# Patient Record
Sex: Female | Born: 1990 | Race: White | Hispanic: No | Marital: Single | Smoking: Former smoker
Health system: Southern US, Community
[De-identification: ages and names within clinical notes are randomized; demographics above are authoritative.]

## PROBLEM LIST (undated history)

## (undated) DIAGNOSIS — I10 Essential (primary) hypertension: Secondary | ICD-10-CM

## (undated) DIAGNOSIS — Z789 Other specified health status: Secondary | ICD-10-CM

## (undated) DIAGNOSIS — F419 Anxiety disorder, unspecified: Secondary | ICD-10-CM

## (undated) HISTORY — DX: Essential (primary) hypertension: I10

## (undated) HISTORY — DX: Other specified health status: Z78.9

---
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
## 2013-10-18 ENCOUNTER — Emergency Department: Payer: Self-pay | Admitting: Emergency Medicine

## 2013-10-28 ENCOUNTER — Emergency Department: Payer: Self-pay | Admitting: Emergency Medicine

## 2014-11-06 ENCOUNTER — Observation Stay: Payer: Self-pay

## 2014-11-09 ENCOUNTER — Observation Stay: Payer: Self-pay | Admitting: Obstetrics and Gynecology

## 2014-11-09 ENCOUNTER — Inpatient Hospital Stay: Payer: Self-pay | Admitting: Obstetrics and Gynecology

## 2014-12-30 NOTE — H&P (Signed)
L&D Evaluation:  History:  HPI 23yoMWF presents in active labor at [redacted]w[redacted]d EGA G1P0000, normal pnc to date.Is Vegetarian.   Presents with contractions   Patient's Medical History No Chronic Illness   Patient's Surgical History none   Medications Pre Natal Vitamins   Allergies NKDA   Social History none   Family History Non-Contributory   ROS:  ROS All systems were reviewed.  HEENT, CNS, GI, GU, Respiratory, CV, Renal and Musculoskeletal systems were found to be normal.   Exam:  Vital Signs stable  other  variant temp 100-101   Urine Protein not completed   General no apparent distress   Mental Status clear   Chest clear   Heart normal sinus rhythm   Abdomen gravid, non-tender   Estimated Fetal Weight Average for gestational age   Fetal Position vt   Back no CVAT   Edema no edema   Pelvic 8/90/-1   Mebranes Ruptured, AROM   Description green/meconium   FHT 160 with moderate variability, occassional sp;ontaneous decels x 2 resolved with position changes and IVF and O2   FHT Description Tachycardia   Ucx regular   Ucx Pain Scale 0 epidural in place   Skin dry   Impression:  Impression active labor   Plan:  Plan monitor contractions and for cervical change   Electronic Signatures: Gennie Alma N (CNM)  (Signed 20-Mar-16 19:13)  Authored: L&D Evaluation   Last Updated: 20-Mar-16 19:13 by Evonnie Pat (CNM)

## 2015-01-27 ENCOUNTER — Telehealth: Payer: Self-pay | Admitting: Obstetrics and Gynecology

## 2015-01-27 NOTE — Telephone Encounter (Signed)
pls make pt appt with mnb she is having bad social anxiety- thanks

## 2015-01-27 NOTE — Telephone Encounter (Signed)
Tris CALLED AND LEFT MSG ON PHONE. SHE SAID SHE HAD QUESTIONS.

## 2015-01-29 ENCOUNTER — Encounter: Payer: Self-pay | Admitting: *Deleted

## 2015-01-30 ENCOUNTER — Ambulatory Visit (INDEPENDENT_AMBULATORY_CARE_PROVIDER_SITE_OTHER): Payer: Self-pay | Admitting: Obstetrics and Gynecology

## 2015-01-30 ENCOUNTER — Other Ambulatory Visit: Payer: Self-pay | Admitting: Obstetrics and Gynecology

## 2015-01-30 ENCOUNTER — Encounter: Payer: Self-pay | Admitting: Obstetrics and Gynecology

## 2015-01-30 VITALS — BP 132/78 | HR 78 | Ht 65.0 in | Wt 126.0 lb

## 2015-01-30 DIAGNOSIS — F4010 Social phobia, unspecified: Secondary | ICD-10-CM

## 2015-01-30 MED ORDER — ALPRAZOLAM 0.5 MG PO TABS: 0.5000 mg | ORAL_TABLET | Freq: Every evening | ORAL | Status: DC | PRN

## 2015-01-30 MED ORDER — ESCITALOPRAM OXALATE 10 MG PO TABS: 10.0000 mg | ORAL_TABLET | Freq: Every day | ORAL | Status: DC

## 2015-01-30 NOTE — Progress Notes (Signed)
Patient ID: Tina Hughes, female   DOB: 03/06/1991, 24 y.o.   MRN: [id number] Subjective:     Tina Hughes is a 24 y.o. female who presents for new evaluation and treatment of anxiety disorder. She has the following anxiety symptoms: difficulty concentrating, dizziness, palpitations, racing thoughts, shortness of breath and sweating. Onset of symptoms was approximately several years ago. Symptoms have been gradually worsening since that time. She denies current suicidal and homicidal ideation. Family history significant for no psychiatric illness. Risk factors: none. Previous treatment includes none. She complains of the following medication side effects: none. The following portions of the patient's history were reviewed and updated as appropriate: allergies, current medications, past family history, past medical history, past social history, past surgical history and problem list.  Review of Systems A comprehensive review of systems was negative.    Objective:    patient calm and no apparent distress    Assessment:    anxiety disorder. Possible organic contributing causes are: none.   Plan:    Reviewed concept of anxiety as biochemical imbalance of neurotransmitters and rationale for treatment. Instructed patient to contact office or on-call physician promptly should condition worsen or any new symptoms appear and provided on-call telephone numbers. IF THE PATIENT HAS ANY SUICIDAL OR HOMICIDAL IDEATIONS, CALL THE OFFICE, DISCUSS WITH A SUPPORT MEMBER, OR GO TO THE ER IMMEDIATELY. Patient was agreeable with this plan. Follow up: 6 weeks. Spent 20 minutes (>50% of visit) discussing the risks of anxiety disorder, the  pathophysiology, etiology, risks, and principles of treatment.

## 2015-03-03 ENCOUNTER — Telehealth: Payer: Self-pay | Admitting: Obstetrics and Gynecology

## 2015-03-03 DIAGNOSIS — F329 Major depressive disorder, single episode, unspecified: Secondary | ICD-10-CM

## 2015-03-03 DIAGNOSIS — F32A Depression, unspecified: Secondary | ICD-10-CM

## 2015-03-03 NOTE — Telephone Encounter (Signed)
PT CALLED AND WOULD LIKE A REFILL ON HER PROZAC. PHARMACY IS [facility] [address]

## 2015-03-04 MED ORDER — FLUOXETINE HCL 20 MG PO CAPS: 20.0000 mg | ORAL_CAPSULE | Freq: Every day | ORAL | Status: DC

## 2015-03-04 NOTE — Telephone Encounter (Signed)
prozac was sent in to [place]

## 2015-03-13 ENCOUNTER — Ambulatory Visit: Payer: Self-pay | Admitting: Obstetrics and Gynecology

## 2015-03-24 ENCOUNTER — Ambulatory Visit (INDEPENDENT_AMBULATORY_CARE_PROVIDER_SITE_OTHER): Payer: Medicaid Other | Admitting: Obstetrics and Gynecology

## 2015-03-24 ENCOUNTER — Encounter: Payer: Self-pay | Admitting: Obstetrics and Gynecology

## 2015-03-24 VITALS — BP 127/77 | HR 87 | Ht 65.0 in | Wt 121.1 lb

## 2015-03-24 DIAGNOSIS — Z30018 Encounter for initial prescription of other contraceptives: Secondary | ICD-10-CM

## 2015-03-24 DIAGNOSIS — Z30017 Encounter for initial prescription of implantable subdermal contraceptive: Secondary | ICD-10-CM

## 2015-03-24 DIAGNOSIS — F419 Anxiety disorder, unspecified: Secondary | ICD-10-CM

## 2015-03-24 MED ORDER — ESCITALOPRAM OXALATE 10 MG PO TABS: 10.0000 mg | ORAL_TABLET | Freq: Every day | ORAL | Status: DC

## 2015-03-24 NOTE — Progress Notes (Signed)
Subjective:    Patient ID: Tina Hughes, female    DOB: 11-30-1990, 24 y.o.   MRN: [id number]  HPI F/U exam for postpartum anxiety- Contraception management  Review of Systems Denies improvement in anxiety, but states xanax helps some- has taken 3 doses; actually feels like anxiety is slightly worse. Sleeping well.     Objective:   Physical Exam A&O x4 Well groomed thin female in no distress    Assessment & Plan:  Postpartum anxeity not under good control; change medication to Lexapro 10mg - rx sent in. RTC prn if symptoms not improving.  Nexplanon insertion: Tina Hughes is a 24 y.o. year old Caucasian female here for Nexplanon insertion.  No LMP recorded. Patient is not currently having periods (Reason: Lactating)., last sexual intercourse was last week, and her pregnancy test today was negative.  Risks/benefits/side effects of Nexplanon have been discussed and her questions have been answered.  Specifically, a failure rate of 08/998 has been reported, with an increased failure rate if pt takes St. John's Wort and/or antiseizure medicaitons.  Tina Hughes is aware of the common side effect of irregular bleeding, which the incidence of decreases over time.  BP 127/77 mmHg  Pulse 87  Ht 5\' 5"  (1.651 m)  Wt 121 lb 1.6 oz (54.931 kg)  BMI 20.15 kg/m2  No results found for this or any previous visit (from the past 24 hour(s)).   She is right-handed, so her left arm, approximately 4 inches proximal from the elbow, was cleansed with alcohol and anesthetized with 2cc of 2% Lidocaine.  The area was cleansed again with betadine and the Nexplanon was inserted per manufacturer's recommendations without difficulty.  A steri-strip and pressure bandage were applied.  Pt was instructed to keep the area clean and dry, remove pressure bandage in 24 hours, and keep insertion site covered with the steri-strip for 3-5 days.  Back up contraception was recommended for 2 weeks.  She was given a card  indicating date Nexplanon was inserted and date it needs to be removed. Follow-up PRN problems.  Tina Hughes,CNM

## 2015-03-24 NOTE — Patient Instructions (Signed)
Etonogestrel implant What is this medicine? ETONOGESTREL (et oh noe JES trel) is a contraceptive (birth control) device. It is used to prevent pregnancy. It can be used for up to 3 years. This medicine may be used for other purposes; ask your health care provider or pharmacist if you have questions. COMMON BRAND NAME(S): Implanon, Nexplanon What should I tell my health care provider before I take this medicine? They need to know if you have any of these conditions: -abnormal vaginal bleeding -blood vessel disease or blood clots -cancer of the breast, cervix, or liver -depression -diabetes -gallbladder disease -headaches -heart disease or recent heart attack -high blood pressure -high cholesterol -kidney disease -liver disease -renal disease -seizures -tobacco smoker -an unusual or allergic reaction to etonogestrel, other hormones, anesthetics or antiseptics, medicines, foods, dyes, or preservatives -pregnant or trying to get pregnant -breast-feeding How should I use this medicine? This device is inserted just under the skin on the inner side of your upper arm by a health care professional. Talk to your pediatrician regarding the use of this medicine in children. Special care may be needed. Overdosage: If you think you've taken too much of this medicine contact a poison control center or emergency room at once. Overdosage: If you think you have taken too much of this medicine contact a poison control center or emergency room at once. NOTE: This medicine is only for you. Do not share this medicine with others. What if I miss a dose? This does not apply. What may interact with this medicine? Do not take this medicine with any of the following medications: -amprenavir -bosentan -fosamprenavir This medicine may also interact with the following medications: -barbiturate medicines for inducing sleep or treating seizures -certain medicines for fungal infections like ketoconazole and  itraconazole -griseofulvin -medicines to treat seizures like carbamazepine, felbamate, oxcarbazepine, phenytoin, topiramate -modafinil -phenylbutazone -rifampin -some medicines to treat HIV infection like atazanavir, indinavir, lopinavir, nelfinavir, tipranavir, ritonavir -St. John's wort This list may not describe all possible interactions. Give your health care provider a list of all the medicines, herbs, non-prescription drugs, or dietary supplements you use. Also tell them if you smoke, drink alcohol, or use illegal drugs. Some items may interact with your medicine. What should I watch for while using this medicine? This product does not protect you against HIV infection (AIDS) or other sexually transmitted diseases. You should be able to feel the implant by pressing your fingertips over the skin where it was inserted. Tell your doctor if you cannot feel the implant. What side effects may I notice from receiving this medicine? Side effects that you should report to your doctor or health care professional as soon as possible: -allergic reactions like skin rash, itching or hives, swelling of the face, lips, or tongue -breast lumps -changes in vision -confusion, trouble speaking or understanding -dark urine -depressed mood -general ill feeling or flu-like symptoms -light-colored stools -loss of appetite, nausea -right upper belly pain -severe headaches -severe pain, swelling, or tenderness in the abdomen -shortness of breath, chest pain, swelling in a leg -signs of pregnancy -sudden numbness or weakness of the face, arm or leg -trouble walking, dizziness, loss of balance or coordination -unusual vaginal bleeding, discharge -unusually weak or tired -yellowing of the eyes or skin Side effects that usually do not require medical attention (Report these to your doctor or health care professional if they continue or are bothersome.): -acne -breast pain -changes in  weight -cough -fever or chills -headache -irregular menstrual bleeding -itching, burning, and   vaginal discharge -pain or difficulty passing urine -sore throat This list may not describe all possible side effects. Call your doctor for medical advice about side effects. You may report side effects to FDA at 1-800-FDA-1088. Where should I keep my medicine? This drug is given in a hospital or clinic and will not be stored at home. NOTE: This sheet is a summary. It may not cover all possible information. If you have questions about this medicine, talk to your doctor, pharmacist, or health care provider.  2015, Elsevier/Gold Standard. (2012-02-13 15:37:45)  

## 2015-04-29 ENCOUNTER — Encounter: Payer: Medicaid Other | Admitting: Obstetrics and Gynecology

## 2016-02-16 ENCOUNTER — Other Ambulatory Visit: Payer: Self-pay | Admitting: *Deleted

## 2016-02-16 ENCOUNTER — Telehealth: Payer: Self-pay | Admitting: *Deleted

## 2016-02-16 MED ORDER — ESCITALOPRAM OXALATE 10 MG PO TABS: 10.0000 mg | ORAL_TABLET | Freq: Every day | ORAL | Status: DC

## 2016-02-16 NOTE — Telephone Encounter (Signed)
Patient called and states she needs a refill of her Lexapro. Her pharmacy [facility] in [place] ( [address])  Thanks

## 2016-02-16 NOTE — Telephone Encounter (Signed)
Done-ac 

## 2016-04-19 ENCOUNTER — Ambulatory Visit (HOSPITAL_COMMUNITY)
Admission: EM | Admit: 2016-04-19 | Discharge: 2016-04-19 | Disposition: A | Discharge status: Home / Self Care | Payer: Medicaid Other | Attending: Family Medicine | Admitting: Family Medicine

## 2016-04-19 ENCOUNTER — Encounter (HOSPITAL_COMMUNITY): Payer: Self-pay | Admitting: Emergency Medicine

## 2016-04-19 DIAGNOSIS — N390 Urinary tract infection, site not specified: Secondary | ICD-10-CM

## 2016-04-19 HISTORY — DX: Anxiety disorder, unspecified: F41.9

## 2016-04-19 LAB — POCT URINALYSIS DIP (DEVICE)
Bilirubin Urine: NEGATIVE
GLUCOSE, UA: NEGATIVE mg/dL
Ketones, ur: NEGATIVE mg/dL
Nitrite: POSITIVE — AB
PROTEIN: 30 mg/dL — AB
SPECIFIC GRAVITY, URINE: 1.02 (ref 1.005–1.030)
UROBILINOGEN UA: 0.2 mg/dL (ref 0.0–1.0)
pH: 7 (ref 5.0–8.0)

## 2016-04-19 LAB — POCT PREGNANCY, URINE: Preg Test, Ur: NEGATIVE

## 2016-04-19 MED ORDER — PHENAZOPYRIDINE HCL 200 MG PO TABS: 200.0000 mg | ORAL_TABLET | Freq: Three times a day (TID) | ORAL | 0 refills | Status: DC

## 2016-04-19 MED ORDER — CIPROFLOXACIN HCL 500 MG PO TABS: 500.0000 mg | ORAL_TABLET | Freq: Two times a day (BID) | ORAL | 0 refills | Status: DC

## 2016-04-19 NOTE — ED Triage Notes (Signed)
The patient presented to the [facility] with a complaint of abdominal pain that started yesterday and hematuria that started this am.

## 2016-04-19 NOTE — ED Provider Notes (Signed)
CSN: [id number]     Arrival date & time 04/19/16  1035 History   First MD Initiated Contact with Patient 04/19/16 1201     Chief Complaint  Patient presents with  . Abdominal Pain   (Consider location/radiation/quality/duration/timing/severity/associated sxs/prior Treatment) Patient c/o dysuria for 2 days   The history is provided by the patient.  Abdominal Pain  Pain location:  Generalized Pain quality: aching   Pain radiates to:  Does not radiate Pain severity:  Moderate Onset quality:  Sudden Duration:  2 days Timing:  Constant Progression:  Worsening Chronicity:  New Relieved by:  Nothing Worsened by:  Nothing Ineffective treatments:  None tried Associated symptoms: dysuria     Past Medical History:  Diagnosis Date  . Anxiety   . Vegetarian    History reviewed. No pertinent surgical history. Family History  Problem Relation Age of Onset  . Cancer Paternal Grandmother     skin cancer   Social History  Substance Use Topics  . Smoking status: Former Research scientist (life sciences)  . Smokeless tobacco: Never Used  . Alcohol use Yes     Comment: occasional   OB History    Gravida Para Term Preterm AB Living   1 1 1     1    SAB TAB Ectopic Multiple Live Births           1     Review of Systems  Constitutional: Negative.   HENT: Negative.   Respiratory: Negative.   Cardiovascular: Negative.   Gastrointestinal: Positive for abdominal pain.  Endocrine: Negative.   Genitourinary: Positive for dysuria.  Musculoskeletal: Negative.   Allergic/Immunologic: Negative.   Neurological: Negative.   Hematological: Negative.   Psychiatric/Behavioral: Negative.     Allergies  Review of patient's allergies indicates no known allergies.  Home Medications   Prior to Admission medications   Medication Sig Start Date End Date Taking? Authorizing Provider  escitalopram (LEXAPRO) 10 MG tablet Take 1 tablet (10 mg total) by mouth daily. 02/16/16  Yes Melody N Shambley, CNM  ALPRAZolam  (XANAX) 0.5 MG tablet Take 1 tablet (0.5 mg total) by mouth at bedtime as needed for anxiety. 01/30/15   Melody N Shambley, CNM  ciprofloxacin (CIPRO) 500 MG tablet Take 1 tablet (500 mg total) by mouth 2 (two) times daily. 04/19/16   Lysbeth Penner, FNP  phenazopyridine (PYRIDIUM) 200 MG tablet Take 1 tablet (200 mg total) by mouth 3 (three) times daily. 04/19/16   Lysbeth Penner, FNP  Prenatal Vit-Fe Fumarate-FA (PRENATAL MULTIVITAMIN) TABS tablet Take 1 tablet by mouth daily at 12 noon.    Historical Provider, MD   Meds Ordered and Administered this Visit  Medications - No data to display  BP 129/84 (BP Location: Left Arm)   Pulse 68   Temp 98.4 F (36.9 C) (Oral)   Resp 18   SpO2 100%  No data found.   Physical Exam  Constitutional: She appears well-developed and well-nourished.  HENT:  Head: Normocephalic and atraumatic.  Right Ear: External ear normal.  Left Ear: External ear normal.  Mouth/Throat: Oropharynx is clear and moist.  Eyes: Conjunctivae and EOM are normal. Pupils are equal, round, and reactive to light.  Neck: Normal range of motion. Neck supple.  Cardiovascular: Normal rate, regular rhythm and normal heart sounds.   Pulmonary/Chest: Effort normal and breath sounds normal.  Abdominal: Soft. Bowel sounds are normal.  Nursing note and vitals reviewed.   Urgent Care Course   Clinical Course    Procedures (including  critical care time)  Labs Review Labs Reviewed  POCT URINALYSIS DIP (DEVICE) - Abnormal; Notable for the following:       Result Value   Hgb urine dipstick MODERATE (*)    Protein, ur 30 (*)    Nitrite POSITIVE (*)    Leukocytes, UA SMALL (*)    All other components within normal limits  POCT PREGNANCY, URINE    Imaging Review No results found.   Visual Acuity Review  Right Eye Distance:   Left Eye Distance:   Bilateral Distance:    Right Eye Near:   Left Eye Near:    Bilateral Near:         MDM   1. UTI (lower urinary  tract infection)    cipro 500mg  one po bid x 7 days #14 Pyridium 200mg  one po tid x 2 d #6    Lysbeth Penner, FNP 04/19/16 1242

## 2016-09-28 ENCOUNTER — Encounter: Payer: Medicaid Other | Admitting: Obstetrics and Gynecology

## 2016-09-30 ENCOUNTER — Encounter: Payer: Self-pay | Admitting: Obstetrics and Gynecology

## 2016-09-30 ENCOUNTER — Ambulatory Visit (INDEPENDENT_AMBULATORY_CARE_PROVIDER_SITE_OTHER): Payer: Medicaid Other | Admitting: Obstetrics and Gynecology

## 2016-09-30 DIAGNOSIS — Z3046 Encounter for surveillance of implantable subdermal contraceptive: Secondary | ICD-10-CM

## 2016-09-30 NOTE — Progress Notes (Signed)
Tina Hughes is a 26 y.o. year old Caucasian female here for Implanon removal.  Patient given informed consent for removal of her Implanon.  There were no vitals taken for this visit.  Appropriate time out taken. Implanon site identified.  Area prepped in usual sterile fashon. One cc of 2% lidocaine was used to anesthetize the area at the distal end of the implant. A small stab incision was made right beside the implant on the distal portion.  The Implanon rod was grasped using hemostats and removed without difficulty.  There was less than 3 cc blood loss. There were no complications.  Steri-strips were applied over the small incision and a pressure bandage was applied.  The patient tolerated the procedure well.  She was instructed to keep the area clean and dry, remove pressure bandage in 24 hours, and keep insertion site covered with the steri-strip for 3-5 days.    Follow-up PRN problems.  Zackarie Chason N Valorie Mcgrory,CNM

## 2016-10-19 ENCOUNTER — Ambulatory Visit
Admission: EM | Admit: 2016-10-19 | Discharge: 2016-10-19 | Disposition: A | Discharge status: Home / Self Care | Payer: Medicaid Other | Attending: Family Medicine | Admitting: Family Medicine

## 2016-10-19 DIAGNOSIS — R300 Dysuria: Secondary | ICD-10-CM

## 2016-10-19 LAB — URINALYSIS, COMPLETE (UACMP) WITH MICROSCOPIC
Glucose, UA: NEGATIVE mg/dL
KETONES UR: NEGATIVE mg/dL
Nitrite: NEGATIVE
PROTEIN: 100 mg/dL — AB
SQUAMOUS EPITHELIAL / LPF: NONE SEEN
Specific Gravity, Urine: 1.02 (ref 1.005–1.030)
pH: 8.5 — ABNORMAL HIGH (ref 5.0–8.0)

## 2016-10-19 LAB — PREGNANCY, URINE: PREG TEST UR: NEGATIVE

## 2016-10-19 MED ORDER — CEPHALEXIN 500 MG PO CAPS: 500.0000 mg | ORAL_CAPSULE | Freq: Two times a day (BID) | ORAL | 0 refills | Status: AC

## 2016-10-19 MED ORDER — PHENAZOPYRIDINE HCL 100 MG PO TABS: 200.0000 mg | ORAL_TABLET | Freq: Three times a day (TID) | ORAL | 0 refills | Status: DC | PRN

## 2016-10-19 MED ORDER — FLUCONAZOLE 150 MG PO TABS: 150.0000 mg | ORAL_TABLET | Freq: Every day | ORAL | 0 refills | Status: DC

## 2016-10-19 NOTE — Discharge Instructions (Signed)
Take medication as prescribed. Rest. Drink plenty of fluids.  ° °Follow up with your primary care physician this week as needed. Return to Urgent care for new or worsening concerns.  ° °

## 2016-10-19 NOTE — ED Provider Notes (Signed)
CSN: [id number]     Arrival date & time 10/19/16  0813 History   First MD Initiated Contact with Patient 10/19/16 [phone]     Chief Complaint  Patient presents with  . Urinary Tract Infection   (Consider location/radiation/quality/duration/timing/severity/associated sxs/prior Treatment) Presenting for the complaints of dysuria 3 days. Patient reports some urinary frequency and urgency but reports burning pain with urination. Reports has had urinary tract infection the past with similar presentation. Reports last UTI was approximately 6 months ago. Denies known trigger, except reports possible bubble bath trigger recently. Reports is sexually active, reports uses barrier protection. Declines pregnancy. Denies abdominal pain, back pain, vaginal complaints, vaginal discharge, vaginal odor, vaginal pain. Reports continues to eat and drink well. Denies fevers.      Past Medical History:  Diagnosis Date  . Anxiety   . Vegetarian    History reviewed. No pertinent surgical history. Family History  Problem Relation Age of Onset  . Cancer Paternal Grandmother     skin cancer   Social History  Substance Use Topics  . Smoking status: Former Research scientist (life sciences)  . Smokeless tobacco: Never Used  . Alcohol use Yes     Comment: occasional   OB History    Gravida Para Term Preterm AB Living   1 1 1     1    SAB TAB Ectopic Multiple Live Births           1     Review of Systems  Constitutional: Negative for activity change, chills, fatigue and fever.  HENT: Negative for sore throat.   Respiratory: Negative for shortness of breath.   Cardiovascular: Negative for chest pain.  Gastrointestinal: Negative for abdominal pain, diarrhea, nausea and vomiting.  Genitourinary: Positive for dysuria, frequency and urgency. Negative for flank pain, hematuria, pelvic pain, vaginal bleeding and vaginal discharge.  Musculoskeletal: Negative for back pain.  Skin: Negative for rash.  Neurological: Negative for dizziness  and weakness.    Allergies  Patient has no known allergies.  Home Medications   Prior to Admission medications   Medication Sig Start Date End Date Taking? Authorizing Provider  cephALEXin (KEFLEX) 500 MG capsule Take 1 capsule (500 mg total) by mouth 2 (two) times daily. 10/19/16 10/26/16  Marylene Land, NP  fluconazole (DIFLUCAN) 150 MG tablet Take 1 tablet (150 mg total) by mouth daily. Take one pill orally, then Repeat in 72 hrs as needed. 10/19/16   Marylene Land, NP  phenazopyridine (PYRIDIUM) 100 MG tablet Take 2 tablets (200 mg total) by mouth 3 (three) times daily as needed for pain. 10/19/16   Marylene Land, NP   Meds Ordered and Administered this Visit  Medications - No data to display  BP 110/68 (BP Location: Left Arm)   Pulse 88   Temp 98.3 F (36.8 C) (Oral)   Resp 18   Wt 130 lb (59 kg)   LMP 09/28/2016   SpO2 100%   BMI 21.63 kg/m  No data found.   Physical Exam  Constitutional: She is oriented to person, place, and time. She appears well-nourished.  HENT:  Head: Atraumatic.  Eyes: Conjunctivae are normal.  Cardiovascular: Normal rate, regular rhythm and normal heart sounds.   Pulmonary/Chest: Effort normal and breath sounds normal. She has no wheezes.  Abdominal: Soft. There is no tenderness. There is no guarding.  No CVA tenderness bilaterally.   Neurological: She is alert and oriented to person, place, and time.  Skin: Skin is warm and dry.    Urgent Care  Course     Procedures (including critical care time)  Labs Review Labs Reviewed  URINALYSIS, COMPLETE (UACMP) WITH MICROSCOPIC - Abnormal; Notable for the following:       Result Value   APPearance CLOUDY (*)    pH 8.5 (*)    Hgb urine dipstick LARGE (*)    Bilirubin Urine SMALL (*)    Protein, ur 100 (*)    Leukocytes, UA LARGE (*)    Bacteria, UA   (*)    Value: TEST NOT REPORTED DUE TO COLOR INTERFERENCE OF URINE PIGMENT   All other components within normal limits  URINE CULTURE   PREGNANCY, URINE    Imaging Review No results found.   Visual Acuity Review  Right Eye Distance:   Left Eye Distance:   Bilateral Distance:    Right Eye Near:   Left Eye Near:    Bilateral Near:         MDM   1. Dysuria    Well-appearing patient. No acute distress. Urinalysis reviewed. Suspect urinary tract infection. Budding yeast also noted and urinalysis and will treat with oral Diflucan. Will treat patient with oral cephalexin and will culture urine. Discussed culturing urine with patient, patient agrees. Encouraged supportive care, rest, fluids. Discussed indication, risks and benefits of medications with patient.  Discussed follow up with Primary care physician this week. Discussed follow up and return parameters including no resolution or any worsening concerns. Patient verbalized understanding and agreed to plan.      Marylene Land, NP 10/19/16 [phone]

## 2016-10-19 NOTE — ED Triage Notes (Signed)
Pt c/o painful urination. She used AZO for a few days with no relief.

## 2016-10-21 LAB — URINE CULTURE: Culture: 70000 — AB

## 2017-02-24 ENCOUNTER — Ambulatory Visit (INDEPENDENT_AMBULATORY_CARE_PROVIDER_SITE_OTHER): Payer: Self-pay | Admitting: Obstetrics and Gynecology

## 2017-02-24 ENCOUNTER — Encounter: Payer: Self-pay | Admitting: Obstetrics and Gynecology

## 2017-02-24 VITALS — BP 138/83 | HR 83 | Ht 66.0 in | Wt 138.0 lb

## 2017-02-24 DIAGNOSIS — Z3401 Encounter for supervision of normal first pregnancy, first trimester: Secondary | ICD-10-CM

## 2017-02-24 LAB — POCT URINE PREGNANCY: Preg Test, Ur: POSITIVE — AB

## 2017-02-24 NOTE — Progress Notes (Signed)
Subjective:     Patient ID: [name], female   DOB: 12-23-1990, 26 y.o.   MRN: [id number]  HPI Here for pregnancy confirmation. Reports onset of nausea 2 weeks ago right after missed menses. UPT at home positive on June 18th. Does reports 2 menses in May, first one in the first week of May lasting the normal 5-6 days, then second one on May 25 & 26 with light spotting.    LMP 01/13/17 gives EDC 10/20/17 and EGA 6w  Review of Systems Negative except nausea    Objective:   Physical Exam  A&Ox4 Well groomed female in no distress UPT+ Blood pressure 138/83, pulse 83, height 5\' 6"  (1.676 m), weight 138 lb (62.6 kg), last menstrual period 01/13/2017. Body mass index is 22.27 kg/m.  Pelvic not done     Assessment:     Missed menses Nausea     Plan:     diclegis samples given and instructed on use. Will return next week for viability and dating scan with NOB labs. Then at 11-12 weeks for NOB PE with me.   Pearce [place], CNM

## 2017-02-28 ENCOUNTER — Ambulatory Visit (INDEPENDENT_AMBULATORY_CARE_PROVIDER_SITE_OTHER): Payer: No Typology Code available for payment source

## 2017-02-28 DIAGNOSIS — Z3401 Encounter for supervision of normal first pregnancy, first trimester: Secondary | ICD-10-CM

## 2017-03-06 ENCOUNTER — Ambulatory Visit: Payer: No Typology Code available for payment source | Admitting: Obstetrics and Gynecology

## 2017-03-06 DIAGNOSIS — N926 Irregular menstruation, unspecified: Secondary | ICD-10-CM

## 2017-03-06 NOTE — Progress Notes (Signed)
Tina Hughes presents for NOB nurse interview visit. Pregnancy confirmation done here at [facility].  G-2 .  P-1 . Pregnancy education material explained and given. _No__ cats in the home. NOB labs ordered. HIV labs and Drug screen were explained optional and she did not decline. Drug screen ordered. PNV encouraged. Genetic screening options discussed. Genetic testing/Unsure.  Pt may discuss with provider. Pt already has appointment with [facility]

## 2017-03-08 LAB — CBC WITH DIFFERENTIAL/PLATELET
BASOS ABS: 0 10*3/uL (ref 0.0–0.2)
Basos: 0 %
EOS (ABSOLUTE): 0 10*3/uL (ref 0.0–0.4)
Eos: 1 %
HEMOGLOBIN: 12.4 g/dL (ref 11.1–15.9)
Hematocrit: 36.4 % (ref 34.0–46.6)
Immature Grans (Abs): 0 10*3/uL (ref 0.0–0.1)
Immature Granulocytes: 0 %
LYMPHS: 32 %
Lymphocytes Absolute: 2.4 10*3/uL (ref 0.7–3.1)
MCH: 27.6 pg (ref 26.6–33.0)
MCHC: 34.1 g/dL (ref 31.5–35.7)
MCV: 81 fL (ref 79–97)
MONOCYTES: 7 %
Monocytes Absolute: 0.6 10*3/uL (ref 0.1–0.9)
Neutrophils Absolute: 4.5 10*3/uL (ref 1.4–7.0)
Neutrophils: 60 %
Platelets: 232 10*3/uL (ref 150–379)
RBC: 4.5 x10E6/uL (ref 3.77–5.28)
RDW: 14.8 % (ref 12.3–15.4)
WBC: 7.6 10*3/uL (ref 3.4–10.8)

## 2017-03-08 LAB — ABO AND RH: Rh Factor: NEGATIVE

## 2017-03-08 LAB — HEPATITIS B SURFACE ANTIGEN: HEP B S AG: NEGATIVE

## 2017-03-08 LAB — ANTIBODY SCREEN: ANTIBODY SCREEN: NEGATIVE

## 2017-03-08 LAB — RUBELLA SCREEN: RUBELLA: 4.28 {index} (ref >0.99)

## 2017-03-08 LAB — VARICELLA ZOSTER ANTIBODY, IGG: VARICELLA: 1759 {index} (ref >165)

## 2017-03-08 LAB — HIV ANTIBODY (ROUTINE TESTING W REFLEX): HIV SCREEN 4TH GENERATION: NONREACTIVE

## 2017-03-08 LAB — RPR: RPR Ser Ql: NONREACTIVE

## 2017-03-09 LAB — URINALYSIS, ROUTINE W REFLEX MICROSCOPIC
Bilirubin, UA: NEGATIVE
KETONES UA: NEGATIVE
LEUKOCYTES UA: NEGATIVE
NITRITE UA: NEGATIVE
RBC, UA: NEGATIVE
Urobilinogen, Ur: 0.2 mg/dL (ref 0.2–1.0)
pH, UA: 5 (ref 5.0–7.5)

## 2017-03-09 LAB — MONITOR DRUG PROFILE 14(MW)
AMPHETAMINE SCREEN URINE: NEGATIVE ng/mL
BARBITURATE SCREEN URINE: NEGATIVE ng/mL
BENZODIAZEPINE SCREEN, URINE: NEGATIVE ng/mL
Buprenorphine, Urine: NEGATIVE ng/mL
CANNABINOIDS UR QL SCN: NEGATIVE ng/mL
COCAINE(METAB.)SCREEN, URINE: NEGATIVE ng/mL
CREATININE(CRT), U: 151.2 mg/dL (ref 20.0–300.0)
FENTANYL, URINE: NEGATIVE pg/mL
MEPERIDINE SCREEN, URINE: NEGATIVE ng/mL
Methadone Screen, Urine: NEGATIVE ng/mL
OPIATE SCREEN URINE: NEGATIVE ng/mL
OXYCODONE+OXYMORPHONE UR QL SCN: NEGATIVE ng/mL
Ph of Urine: 5.5 (ref 4.5–8.9)
Phencyclidine Qn, Ur: NEGATIVE ng/mL
Propoxyphene Scrn, Ur: NEGATIVE ng/mL
SPECIFIC GRAVITY: 1.02
TRAMADOL SCREEN, URINE: NEGATIVE ng/mL

## 2017-03-09 LAB — GC/CHLAMYDIA PROBE AMP
Chlamydia trachomatis, NAA: NEGATIVE
Neisseria gonorrhoeae by PCR: NEGATIVE

## 2017-03-10 LAB — URINE CULTURE

## 2017-03-14 ENCOUNTER — Other Ambulatory Visit: Payer: Self-pay | Admitting: Obstetrics and Gynecology

## 2017-03-14 DIAGNOSIS — R8271 Bacteriuria: Secondary | ICD-10-CM | POA: Insufficient documentation

## 2017-03-14 MED ORDER — FLUCONAZOLE 100 MG PO TABS: 100.0000 mg | ORAL_TABLET | Freq: Every day | ORAL | 0 refills | Status: AC

## 2017-03-31 ENCOUNTER — Ambulatory Visit (INDEPENDENT_AMBULATORY_CARE_PROVIDER_SITE_OTHER): Payer: No Typology Code available for payment source | Admitting: Obstetrics and Gynecology

## 2017-03-31 ENCOUNTER — Encounter: Payer: Self-pay | Admitting: Obstetrics and Gynecology

## 2017-03-31 VITALS — BP 120/72 | HR 88 | Wt 138.0 lb

## 2017-03-31 DIAGNOSIS — F329 Major depressive disorder, single episode, unspecified: Secondary | ICD-10-CM | POA: Insufficient documentation

## 2017-03-31 DIAGNOSIS — Z6791 Unspecified blood type, Rh negative: Secondary | ICD-10-CM

## 2017-03-31 DIAGNOSIS — Z3401 Encounter for supervision of normal first pregnancy, first trimester: Secondary | ICD-10-CM

## 2017-03-31 DIAGNOSIS — Z789 Other specified health status: Secondary | ICD-10-CM | POA: Insufficient documentation

## 2017-03-31 DIAGNOSIS — F419 Anxiety disorder, unspecified: Secondary | ICD-10-CM

## 2017-03-31 DIAGNOSIS — O09899 Supervision of other high risk pregnancies, unspecified trimester: Secondary | ICD-10-CM

## 2017-03-31 DIAGNOSIS — O26899 Other specified pregnancy related conditions, unspecified trimester: Secondary | ICD-10-CM

## 2017-03-31 LAB — POCT URINALYSIS DIPSTICK
Bilirubin, UA: NEGATIVE
Blood, UA: NEGATIVE
Glucose, UA: NEGATIVE
KETONES UA: NEGATIVE
LEUKOCYTES UA: NEGATIVE
Nitrite, UA: NEGATIVE
PH UA: 6 (ref 5.0–8.0)
PROTEIN UA: NEGATIVE
SPEC GRAV UA: 1.01 (ref 1.010–1.025)
Urobilinogen, UA: 0.2 E.U./dL

## 2017-03-31 MED ORDER — PRENATAL 19 29-1 MG PO CHEW: 1.0000 | CHEWABLE_TABLET | Freq: Every day | ORAL | 11 refills | Status: DC

## 2017-03-31 NOTE — Patient Instructions (Signed)

## 2017-03-31 NOTE — Progress Notes (Signed)
NEW OB HISTORY AND PHYSICAL  SUBJECTIVE:       Tina Hughes is a 26 y.o. [id number] female, Patient's last menstrual period was 01/13/2017., Estimated Date of Delivery: 10/20/17, [redacted]w[redacted]d, presents today for establishment of Prenatal Care. She has no unusual complaints and complains of occasional vomiting after brushing teeth.      Gynecologic History Patient's last menstrual period was 01/13/2017. Normal Contraception: none Last Pap: 2016. Results were: normal  Obstetric History OB History  Gravida Para Term Preterm AB Living  2 1 1     1   SAB TAB Ectopic Multiple Live Births          1    # Outcome Date GA Lbr Len/2nd Weight Sex Delivery Anes PTL Lv  2 Current           1 Term 11/09/14    Jerilynn Mages Vag-Spont   LIV      Past Medical History:  Diagnosis Date  . Anxiety   . Vegetarian     History reviewed. No pertinent surgical history.  Current Outpatient Prescriptions on File Prior to Visit  Medication Sig Dispense Refill  . Prenatal Vit-Fe Fumarate-FA (PRENATAL MULTIVITAMIN) TABS tablet Take 1 tablet by mouth daily at 12 noon.     No current facility-administered medications on file prior to visit.     No Known Allergies  Social History   Social History  . Marital status: Married    Spouse name: N/A  . Number of children: N/A  . Years of education: N/A   Occupational History  . Not on file.   Social History Main Topics  . Smoking status: Former Research scientist (life sciences)  . Smokeless tobacco: Never Used  . Alcohol use Yes     Comment: occasional  . Drug use: No  . Sexual activity: Yes    Birth control/ protection: None   Other Topics Concern  . Not on file   Social History Narrative  . No narrative on file    Family History  Problem Relation Age of Onset  . Cancer Paternal Grandmother        skin cancer    The following portions of the patient's history were reviewed and updated as appropriate: allergies, current medications, past OB history, past medical history, past  surgical history, past family history, past social history, and problem list.    OBJECTIVE: Initial Physical Exam (New OB)  GENERAL APPEARANCE: alert, well appearing, in no apparent distress, oriented to person, place and time HEAD: normocephalic, atraumatic MOUTH: mucous membranes moist, pharynx normal without lesions and dental hygiene good THYROID: no thyromegaly or masses present BREASTS: not examined LUNGS: clear to auscultation, no wheezes, rales or rhonchi, symmetric air entry HEART: regular rate and rhythm, no murmurs ABDOMEN: soft, nontender, nondistended, no abnormal masses, no epigastric pain, fundus not palpable and FHT present EXTREMITIES: no redness or tenderness in the calves or thighs SKIN: normal coloration and turgor, no rashes LYMPH NODES: no adenopathy palpable NEUROLOGIC: alert, oriented, normal speech, no focal findings or movement disorder noted  PELVIC EXAM not indicated  ASSESSMENT: Normal pregnancy  PLAN: Prenatal care Desires genetic screening- CFP and MaternIT obtained today See orders

## 2017-03-31 NOTE — Progress Notes (Signed)
NOB physical- pt is doing well, has had some nausea brushing her teeth

## 2017-04-02 LAB — URINE CULTURE

## 2017-04-06 LAB — MATERNIT 21 PLUS CORE, BLOOD
CHROMOSOME 13: NEGATIVE
CHROMOSOME 18: NEGATIVE
Chromosome 21: NEGATIVE
Y Chromosome: NOT DETECTED

## 2017-04-07 LAB — CYSTIC FIBROSIS MUTATION 97: Interpretation: NOT DETECTED

## 2017-04-14 ENCOUNTER — Telehealth: Payer: Self-pay | Admitting: Obstetrics and Gynecology

## 2017-04-14 ENCOUNTER — Telehealth: Payer: Self-pay | Admitting: Certified Nurse Midwife

## 2017-04-14 NOTE — Telephone Encounter (Signed)
The patient called and stated that she has tested positive for bacteria in the urine and that she may have a UTI. The patient would like to be called and prescribed a medication to help with her symptoms. Please advise.

## 2017-04-14 NOTE — Telephone Encounter (Signed)
The patient called and lvm stating that she would like to speak with the nurse Amy. The patient did not disclose any other information. Please advise.

## 2017-04-17 NOTE — Telephone Encounter (Signed)
Please contact patient. Has GBS in urine, but not enough to cause infection at this time. Keep watch for symptoms. Wil; treat for GBS at time of birth. Thanks, JML

## 2017-04-17 NOTE — Telephone Encounter (Signed)
Spoke with pt

## 2017-04-18 NOTE — Telephone Encounter (Signed)
Pt aware.

## 2017-04-28 ENCOUNTER — Ambulatory Visit (INDEPENDENT_AMBULATORY_CARE_PROVIDER_SITE_OTHER): Payer: No Typology Code available for payment source | Admitting: Certified Nurse Midwife

## 2017-04-28 ENCOUNTER — Encounter: Payer: Self-pay | Admitting: Certified Nurse Midwife

## 2017-04-28 VITALS — BP 124/69 | HR 102 | Wt 139.0 lb

## 2017-04-28 DIAGNOSIS — Z3689 Encounter for other specified antenatal screening: Secondary | ICD-10-CM

## 2017-04-28 DIAGNOSIS — Z3402 Encounter for supervision of normal first pregnancy, second trimester: Secondary | ICD-10-CM

## 2017-04-28 DIAGNOSIS — R51 Headache: Secondary | ICD-10-CM

## 2017-04-28 DIAGNOSIS — R519 Headache, unspecified: Secondary | ICD-10-CM

## 2017-04-28 DIAGNOSIS — O26892 Other specified pregnancy related conditions, second trimester: Secondary | ICD-10-CM

## 2017-04-28 LAB — POCT URINALYSIS DIPSTICK
Bilirubin, UA: NEGATIVE
Glucose, UA: NEGATIVE
Ketones, UA: NEGATIVE
Leukocytes, UA: NEGATIVE
NITRITE UA: NEGATIVE
PH UA: 5 (ref 5.0–8.0)
Protein, UA: NEGATIVE
RBC UA: NEGATIVE
SPEC GRAV UA: 1.025 (ref 1.010–1.025)
UROBILINOGEN UA: 0.2 U/dL

## 2017-04-28 MED ORDER — MAGNESIUM OXIDE 400 (241.3 MG) MG PO TABS: 400.0000 mg | ORAL_TABLET | Freq: Two times a day (BID) | ORAL | 5 refills | Status: DC

## 2017-04-28 NOTE — Patient Instructions (Addendum)

## 2017-04-29 NOTE — Progress Notes (Signed)
ROB-Reports fatigue and daily headaches relieved by Tylenol. Notes a decrease in vomiting. Discussed home treatment measures including use of OTC medications. Rx Magnesium oxide, see orders. Reviewed red flag symptoms and when to call. RTC x 5 weeks for anatomy scan and ROB.

## 2017-06-02 ENCOUNTER — Ambulatory Visit (INDEPENDENT_AMBULATORY_CARE_PROVIDER_SITE_OTHER): Payer: No Typology Code available for payment source | Admitting: Certified Nurse Midwife

## 2017-06-02 ENCOUNTER — Encounter: Payer: Self-pay | Admitting: Certified Nurse Midwife

## 2017-06-02 ENCOUNTER — Ambulatory Visit (INDEPENDENT_AMBULATORY_CARE_PROVIDER_SITE_OTHER): Payer: No Typology Code available for payment source

## 2017-06-02 ENCOUNTER — Encounter: Payer: No Typology Code available for payment source | Admitting: Certified Nurse Midwife

## 2017-06-02 ENCOUNTER — Other Ambulatory Visit: Payer: No Typology Code available for payment source

## 2017-06-02 VITALS — BP 122/67 | HR 89 | Wt 144.7 lb

## 2017-06-02 DIAGNOSIS — Z23 Encounter for immunization: Secondary | ICD-10-CM

## 2017-06-02 DIAGNOSIS — Z3689 Encounter for other specified antenatal screening: Secondary | ICD-10-CM

## 2017-06-02 DIAGNOSIS — Z3402 Encounter for supervision of normal first pregnancy, second trimester: Secondary | ICD-10-CM

## 2017-06-02 LAB — POCT URINALYSIS DIPSTICK
BILIRUBIN UA: NEGATIVE
GLUCOSE UA: 500
KETONES UA: NEGATIVE
Leukocytes, UA: NEGATIVE
Nitrite, UA: NEGATIVE
PH UA: 6 (ref 5.0–8.0)
Protein, UA: NEGATIVE
Spec Grav, UA: 1.025 (ref 1.010–1.025)
Urobilinogen, UA: 0.2 E.U./dL

## 2017-06-02 NOTE — Progress Notes (Signed)
ROB-  Pt states she has been doing ok but she still has occ. Headaches that has been better since taking magnesium. Urine dip glucose 500

## 2017-06-02 NOTE — Progress Notes (Signed)
ROB, doing well.  Feels movement. Has occasional cramps. Anatomy u/s today, not complete but otherwise normal. See below for full report. Follow 2 wks for u/s and 4 wks for ROB .   Philip Aspen, CNM  ULTRASOUND REPORT  Location: [facility] Date of Service: 06/02/17  Indications:Anatomy Findings:  Tina Hughes intrauterine pregnancy is visualized with FHR at 145 BPM. Biometrics give an (U/S) Gestational age of  14 2/7 weeks and an (U/S) EDD of 10/18/17; this correlates with the clinically established EDD of 10/20/17.  Fetal presentation is breech.  EFW: 347 grams (0lb 12oz). Placenta: Anterior and grade 1. Placenta is 6.4cm from cervical os. AFI: Appears WNL subjectively.  Anatomic survey is incomplete.  All heart views, an open hand, and profile are needed to complete survey. Gender - female  .    Right Ovary was not visualized. Left Ovary measures 3.0 x 1.5 x 2.1 cm., and appears WNL. There is no evidence of a corpus luteal cyst. Survey of the adnexa demonstrates no adnexal masses. There is no free peritoneal fluid in the cul de sac.  Impression: 1. 20 week Viable Singleton Intrauterine pregnancy by U/S. 2. (U/S) EDD is consistent with Clinically established (LMP) EDD of 10/20/17. 3. Incomplete anatomical survey.  Heart views, open hand, and profile are still needed.  Recommendations: 1.Clinical correlation with the patient's History and Physical Exam. 2. F/U U/S recommended in 2 weeks to obtain remaining anatomy.  Dario Ave, RDMS  Scan reviewed and agree with findings.  Melody Shambley,CNM

## 2017-06-02 NOTE — Patient Instructions (Signed)

## 2017-06-08 ENCOUNTER — Other Ambulatory Visit: Payer: Self-pay | Admitting: Certified Nurse Midwife

## 2017-06-08 DIAGNOSIS — Z0489 Encounter for examination and observation for other specified reasons: Secondary | ICD-10-CM

## 2017-06-08 DIAGNOSIS — IMO0002 Reserved for concepts with insufficient information to code with codable children: Secondary | ICD-10-CM

## 2017-06-15 ENCOUNTER — Telehealth: Payer: Self-pay | Admitting: Obstetrics and Gynecology

## 2017-06-15 NOTE — Telephone Encounter (Signed)
Left pt detailed message.

## 2017-06-15 NOTE — Telephone Encounter (Signed)
Patient called and stated that she would like for Amy to give her a call back. No other information was disclosed on the vm. Please advise.

## 2017-06-15 NOTE — Telephone Encounter (Signed)
Patient called and stated that she is 22 wks and took a shower this morning and noticed that she had some brown discharge, and slight cramping. The patient would like to speak with a nurse to make sure everything is okay. No other information was disclosed. Please advise.

## 2017-06-16 ENCOUNTER — Ambulatory Visit (INDEPENDENT_AMBULATORY_CARE_PROVIDER_SITE_OTHER): Payer: No Typology Code available for payment source

## 2017-06-16 DIAGNOSIS — IMO0002 Reserved for concepts with insufficient information to code with codable children: Secondary | ICD-10-CM

## 2017-06-16 DIAGNOSIS — Z0489 Encounter for examination and observation for other specified reasons: Secondary | ICD-10-CM

## 2017-06-30 ENCOUNTER — Encounter: Payer: No Typology Code available for payment source | Admitting: Obstetrics and Gynecology

## 2017-07-05 ENCOUNTER — Other Ambulatory Visit: Payer: Self-pay

## 2017-07-05 ENCOUNTER — Ambulatory Visit (INDEPENDENT_AMBULATORY_CARE_PROVIDER_SITE_OTHER): Payer: No Typology Code available for payment source | Admitting: Certified Nurse Midwife

## 2017-07-05 ENCOUNTER — Encounter: Payer: Self-pay | Admitting: Certified Nurse Midwife

## 2017-07-05 VITALS — BP 129/71 | HR 77 | Wt 153.4 lb

## 2017-07-05 DIAGNOSIS — Z3402 Encounter for supervision of normal first pregnancy, second trimester: Secondary | ICD-10-CM

## 2017-07-05 DIAGNOSIS — Z130 Encounter for screening for diseases of the blood and blood-forming organs and certain disorders involving the immune mechanism: Secondary | ICD-10-CM

## 2017-07-05 DIAGNOSIS — Z131 Encounter for screening for diabetes mellitus: Secondary | ICD-10-CM

## 2017-07-05 LAB — POCT URINALYSIS DIPSTICK
BILIRUBIN UA: NEGATIVE
GLUCOSE UA: NEGATIVE
KETONES UA: NEGATIVE
Leukocytes, UA: NEGATIVE
Nitrite, UA: NEGATIVE
PH UA: 6 (ref 5.0–8.0)
Protein, UA: NEGATIVE
RBC UA: NEGATIVE
Spec Grav, UA: 1.01 (ref 1.010–1.025)
Urobilinogen, UA: 0.2 E.U./dL

## 2017-07-05 NOTE — Patient Instructions (Signed)

## 2017-07-05 NOTE — Progress Notes (Signed)
ROB doing well. No complaints. Endorses good fetal movement. Reviewed 1 hr GTT at next appointment . She verbalizes understanding Follow up 4 wks.   Philip Aspen, CNM

## 2017-07-26 ENCOUNTER — Other Ambulatory Visit: Payer: Self-pay

## 2017-07-26 ENCOUNTER — Encounter: Payer: Self-pay | Admitting: Emergency Medicine

## 2017-07-26 ENCOUNTER — Ambulatory Visit
Admission: EM | Admit: 2017-07-26 | Discharge: 2017-07-26 | Disposition: A | Discharge status: Home / Self Care | Payer: No Typology Code available for payment source | Attending: Family Medicine | Admitting: Family Medicine

## 2017-07-26 DIAGNOSIS — Z808 Family history of malignant neoplasm of other organs or systems: Secondary | ICD-10-CM | POA: Diagnosis not present

## 2017-07-26 DIAGNOSIS — B9789 Other viral agents as the cause of diseases classified elsewhere: Secondary | ICD-10-CM

## 2017-07-26 DIAGNOSIS — Z87891 Personal history of nicotine dependence: Secondary | ICD-10-CM | POA: Diagnosis not present

## 2017-07-26 DIAGNOSIS — J029 Acute pharyngitis, unspecified: Secondary | ICD-10-CM | POA: Diagnosis present

## 2017-07-26 DIAGNOSIS — J069 Acute upper respiratory infection, unspecified: Secondary | ICD-10-CM

## 2017-07-26 LAB — RAPID STREP SCREEN (MED CTR MEBANE ONLY): STREPTOCOCCUS, GROUP A SCREEN (DIRECT): NEGATIVE

## 2017-07-26 NOTE — ED Provider Notes (Signed)
[facility]    CSN: [id number] Arrival date & time: 07/26/17  1433     History   Chief Complaint Chief Complaint  Patient presents with  . Sore Throat    HPI Tina Hughes is a 26 y.o. female.   The history is provided by the patient.  Sore Throat  Pertinent negatives include no headaches.  URI  Presenting symptoms: rhinorrhea and sore throat   Severity:  Moderate Onset quality:  Sudden Duration:  1 day Timing:  Constant Progression:  Unchanged Chronicity:  New Relieved by:  None tried Ineffective treatments:  None tried Associated symptoms: no headaches, no sinus pain and no wheezing   Risk factors: sick contacts   Risk factors: not elderly, no chronic cardiac disease, no chronic kidney disease, no chronic respiratory disease, no diabetes mellitus, no immunosuppression, no recent illness and no recent travel     Past Medical History:  Diagnosis Date  . Anxiety   . Vegetarian     Patient Active Problem List   Diagnosis Date Noted  . Rh negative state in antepartum period 03/31/2017  . Anxiety 03/31/2017  . Vegetarian diet 03/31/2017  . GBS bacteriuria 03/14/2017    History reviewed. No pertinent surgical history.  OB History    Gravida Para Term Preterm AB Living   2 1 1     1    SAB TAB Ectopic Multiple Live Births           1       Home Medications    Prior to Admission medications   Medication Sig Start Date End Date Taking? Authorizing Provider  magnesium oxide (MAG-OX) 400 (241.3 Mg) MG tablet Take 1 tablet (400 mg total) by mouth 2 (two) times daily. 04/28/17   Lawhorn, Lara Mulch, CNM  UNABLE TO FIND Med Name: [facility]'s bounty Prenatal multi healthy mom    [provider]    Family History Family History  Problem Relation Age of Onset  . Cancer Paternal Grandmother        skin cancer    Social History Social History   Tobacco Use  . Smoking status: Former Research scientist (life sciences)  . Smokeless tobacco: Never Used    Substance Use Topics  . Alcohol use: No    Comment: occasional  . Drug use: No     Allergies   Patient has no known allergies.   Review of Systems Review of Systems  HENT: Positive for rhinorrhea and sore throat. Negative for sinus pain.   Respiratory: Negative for wheezing.   Neurological: Negative for headaches.     Physical Exam Triage Vital Signs ED Triage Vitals  Enc Vitals Group     BP 07/26/17 1459 121/69     Pulse Rate 07/26/17 1459 90     Resp 07/26/17 1459 14     Temp 07/26/17 1459 98.6 F (37 C)     Temp Source 07/26/17 1459 Oral     SpO2 07/26/17 1459 98 %     Weight 07/26/17 1456 164 lb (74.4 kg)     Height --      Head Circumference --      Peak Flow --      Pain Score 07/26/17 1456 6     Pain Loc --      Pain Edu? --      Excl. in [place]? --    No data found.  Updated Vital Signs BP 121/69 (BP Location: Right Arm)   Pulse 90  Temp 98.6 F (37 C) (Oral)   Resp 14   Wt 164 lb (74.4 kg)   LMP 01/13/2017   SpO2 98%   BMI 26.47 kg/m   Visual Acuity Right Eye Distance:   Left Eye Distance:   Bilateral Distance:    Right Eye Near:   Left Eye Near:    Bilateral Near:     Physical Exam  Constitutional: She appears well-developed and well-nourished. No distress.  HENT:  Head: Normocephalic and atraumatic.  Right Ear: Tympanic membrane, external ear and ear canal normal.  Left Ear: Tympanic membrane, external ear and ear canal normal.  Nose: No mucosal edema, rhinorrhea, nose lacerations, sinus tenderness, nasal deformity, septal deviation or nasal septal hematoma. No epistaxis.  No foreign bodies. Right sinus exhibits no maxillary sinus tenderness and no frontal sinus tenderness. Left sinus exhibits no maxillary sinus tenderness and no frontal sinus tenderness.  Mouth/Throat: Uvula is midline and mucous membranes are normal. Posterior oropharyngeal erythema present. No oropharyngeal exudate, posterior oropharyngeal edema or tonsillar abscesses.  No tonsillar exudate.  Neck: Normal range of motion. Neck supple. No thyromegaly present.  Cardiovascular: Normal rate, regular rhythm and normal heart sounds.  Pulmonary/Chest: Effort normal and breath sounds normal. No respiratory distress. She has no wheezes. She has no rales.  Lymphadenopathy:    She has no cervical adenopathy.  Skin: She is not diaphoretic.  Nursing note and vitals reviewed.    UC Treatments / Results  Labs (all labs ordered are listed, but only abnormal results are displayed) Labs Reviewed  RAPID STREP SCREEN (NOT AT [facility])  CULTURE, GROUP A STREP [facility])    EKG  EKG Interpretation None       Radiology No results found.  Procedures Procedures (including critical care time)  Medications Ordered in UC Medications - No data to display   Initial Impression / Assessment and Plan / UC Course  I have reviewed the triage vital signs and the nursing notes.  Pertinent labs & imaging results that were available during my care of the patient were reviewed by me and considered in my medical decision making (see chart for details).    Final Clinical Impressions(s) / UC Diagnoses   Final diagnoses:  Viral URI with cough    ED Discharge Orders    None     1. Lab result and diagnosis reviewed with patient 2.Recommend supportive treatment with rest, fluids, salt water gargles, otc meds 3. Follow-up prn if symptoms worsen or don't improve   Controlled Substance Prescriptions [place] Controlled Substance Registry consulted? Not Applicable   Norval Gable, MD 07/26/17 [phone]

## 2017-07-26 NOTE — ED Triage Notes (Signed)
Patient c/o sore throat started yesterday.

## 2017-07-26 NOTE — ED Notes (Signed)
Fetal Heart 153

## 2017-07-29 LAB — CULTURE, GROUP A STREP (THRC)

## 2017-08-01 ENCOUNTER — Ambulatory Visit (INDEPENDENT_AMBULATORY_CARE_PROVIDER_SITE_OTHER): Payer: No Typology Code available for payment source | Admitting: Certified Nurse Midwife

## 2017-08-01 ENCOUNTER — Other Ambulatory Visit: Payer: No Typology Code available for payment source

## 2017-08-01 ENCOUNTER — Encounter: Payer: No Typology Code available for payment source | Admitting: Obstetrics and Gynecology

## 2017-08-01 ENCOUNTER — Encounter: Payer: Self-pay | Admitting: Certified Nurse Midwife

## 2017-08-01 VITALS — BP 118/76 | HR 88 | Wt 158.4 lb

## 2017-08-01 DIAGNOSIS — Z23 Encounter for immunization: Secondary | ICD-10-CM | POA: Diagnosis not present

## 2017-08-01 DIAGNOSIS — Z3482 Encounter for supervision of other normal pregnancy, second trimester: Secondary | ICD-10-CM

## 2017-08-01 LAB — POCT URINALYSIS DIPSTICK
BILIRUBIN UA: NEGATIVE
GLUCOSE UA: NEGATIVE
Ketones, UA: NEGATIVE
Leukocytes, UA: NEGATIVE
Nitrite, UA: NEGATIVE
Protein, UA: NEGATIVE
RBC UA: NEGATIVE
Spec Grav, UA: 1.01 (ref 1.010–1.025)
Urobilinogen, UA: 0.2 E.U./dL
pH, UA: 7.5 (ref 5.0–8.0)

## 2017-08-01 MED ORDER — RHO D IMMUNE GLOBULIN 1500 UNITS IM SOSY
1500.0000 [IU] | PREFILLED_SYRINGE | Freq: Once | INTRAMUSCULAR | Status: AC
  Administered 2017-08-01: 1500 [IU] via INTRAMUSCULAR

## 2017-08-01 NOTE — Progress Notes (Signed)
ROB, doing well. Discussed cord blood donation (private & public) and birth control after baby. Reviewed GTT and cbc to be done tomorrow am. Rhogam today and BTC/Tdap  form completed. Rob in 2 wks.   Philip Aspen, CNM

## 2017-08-01 NOTE — Patient Instructions (Signed)
Glucose Tolerance Test During Pregnancy The glucose tolerance test (GTT) is a blood test used to determine if you have developed a type of diabetes during pregnancy (gestational diabetes). This is when your body does not properly process sugar (glucose) in the food you eat, resulting in high blood glucose levels. Typically, a GTT is done after you have had a 1-hour glucose test with results that indicate you possibly have gestational diabetes. It may also be done if:  You have a history of giving birth to very large babies or have experienced repeated fetal loss (stillbirth).  You have signs and symptoms of diabetes, such as: ? Changes in your vision. ? Tingling or numbness in your hands or feet. ? Changes in hunger, thirst, and urination not otherwise explained by your pregnancy.  The GTT lasts about 3 hours. You will be given a sugar-water solution to drink at the beginning of the test. You will have blood drawn before you drink the solution and then again 1, 2, and 3 hours after you drink it. You will not be allowed to eat or drink anything else during the test. You must remain at the testing location to make sure that your blood is drawn on time. You should also avoid exercising during the test, because exercise can alter test results. How do I prepare for this test? Eat normally for 3 days prior to the GTT test, including having plenty of carbohydrate-rich foods. Do not eat or drink anything except water during the final 12 hours before the test. In addition, your health care provider may ask you to stop taking certain medicines before the test. What do the results mean? It is your responsibility to obtain your test results. Ask the lab or department performing the test when and how you will get your results. Contact your health care provider to discuss any questions you have about your results. Range of Normal Values Ranges for normal values may vary among different labs and hospitals. You  should always check with your health care provider after having lab work or other tests done to discuss whether your values are considered within normal limits. Normal levels of blood glucose are as follows:  Fasting: less than 105 mg/dL.  1 hour after drinking the solution: less than 190 mg/dL.  2 hours after drinking the solution: less than 165 mg/dL.  3 hours after drinking the solution: less than 145 mg/dL.  Some substances can interfere with GTT results. These may include:  Blood pressure and heart failure medicines, including beta blockers, furosemide, and thiazides.  Anti-inflammatory medicines, including aspirin.  Nicotine.  Some psychiatric medicines.  Meaning of Results Outside Normal Value Ranges GTT test results that are above normal values may indicate a number of health problems, such as:  Gestational diabetes.  Acute stress response.  Cushing syndrome.  Tumors such as pheochromocytoma or glucagonoma.  Long-term kidney problems.  Pancreatitis.  Hyperthyroidism.  Current infection.  Discuss your test results with your health care provider. He or she will use the results to make a diagnosis and determine a treatment plan that is right for you. This information is not intended to replace advice given to you by your health care provider. Make sure you discuss any questions you have with your health care provider. Document Released: 02/07/2012 Document Revised: 01/14/2016 Document Reviewed: 12/13/2013 Elsevier Interactive Patient Education  2017 [employer].

## 2017-08-01 NOTE — Progress Notes (Signed)
ROB- tdap,rhogam, and BTC- today. 1 HR GTT in the am.

## 2017-08-02 ENCOUNTER — Other Ambulatory Visit: Payer: No Typology Code available for payment source

## 2017-08-02 ENCOUNTER — Other Ambulatory Visit: Payer: Self-pay

## 2017-08-02 DIAGNOSIS — Z3493 Encounter for supervision of normal pregnancy, unspecified, third trimester: Secondary | ICD-10-CM

## 2017-08-02 DIAGNOSIS — Z131 Encounter for screening for diabetes mellitus: Secondary | ICD-10-CM

## 2017-08-02 DIAGNOSIS — Z130 Encounter for screening for diseases of the blood and blood-forming organs and certain disorders involving the immune mechanism: Secondary | ICD-10-CM

## 2017-08-03 ENCOUNTER — Encounter: Payer: Self-pay | Admitting: Certified Nurse Midwife

## 2017-08-03 LAB — CBC
Hematocrit: 31.7 % — ABNORMAL LOW (ref 34.0–46.6)
Hemoglobin: 11.2 g/dL (ref 11.1–15.9)
MCH: 29.8 pg (ref 26.6–33.0)
MCHC: 35.3 g/dL (ref 31.5–35.7)
MCV: 84 fL (ref 79–97)
PLATELETS: 222 10*3/uL (ref 150–379)
RBC: 3.76 x10E6/uL — ABNORMAL LOW (ref 3.77–5.28)
RDW: 13.5 % (ref 12.3–15.4)
WBC: 9.8 10*3/uL (ref 3.4–10.8)

## 2017-08-03 LAB — RPR: RPR: NONREACTIVE

## 2017-08-03 LAB — GLUCOSE, 1 HOUR GESTATIONAL: GESTATIONAL DIABETES SCREEN: 89 mg/dL (ref 65–139)

## 2017-08-17 ENCOUNTER — Encounter: Payer: Self-pay | Admitting: Certified Nurse Midwife

## 2017-08-17 ENCOUNTER — Ambulatory Visit (INDEPENDENT_AMBULATORY_CARE_PROVIDER_SITE_OTHER): Payer: No Typology Code available for payment source | Admitting: Certified Nurse Midwife

## 2017-08-17 VITALS — BP 104/67 | HR 94 | Wt 161.7 lb

## 2017-08-17 DIAGNOSIS — Z3403 Encounter for supervision of normal first pregnancy, third trimester: Secondary | ICD-10-CM

## 2017-08-17 LAB — POCT URINALYSIS DIPSTICK
BILIRUBIN UA: NEGATIVE
Blood, UA: NEGATIVE
Glucose, UA: NEGATIVE
KETONES UA: NEGATIVE
Leukocytes, UA: NEGATIVE
Nitrite, UA: NEGATIVE
PH UA: 7 (ref 5.0–8.0)
Spec Grav, UA: 1.02 (ref 1.010–1.025)
Urobilinogen, UA: 0.2 E.U./dL

## 2017-08-17 NOTE — Progress Notes (Signed)
ROB- Pt states she is doing well, no complaints

## 2017-08-17 NOTE — Patient Instructions (Signed)

## 2017-08-21 NOTE — Progress Notes (Signed)
ROB-Doing well, no questions or concerns. Anticipatory guidance regarding course of prenatal care. Reviewed red flag symptoms and when to call. RTC x 2 weeks for ROB with Deneise Lever or sooner if needed.

## 2017-08-22 NOTE — L&D Delivery Note (Signed)
0830 In room to see patient, reports pelvic pressure with the urge to push. SVE: 10/100/+2, vertex. Effective maternal pushing efforts noted.   0840 Spontaneous vaginal birth of liveborn female infant in right occiput posterior position over intact perineum. Infant immediately to maternal abdomen. Delayed cord clamping, three (3) vessel cord and cord blood collected.APGARS: 8, 9. Weight: 3560 grams (7 pounds 13.6 ounces).   Pitocin infusing. Spontaneous delivery of intact placenta at 0845. Bilateral symmetric labial lacerations hemostatic, left side repaired with 3-0 Monocryl sh to promote bilateral healing. Anesthesia: epidural. In and out cath: 250 ml. QBL: 150 ml. Uterus firm. Lochia: small. Vault check completed. Counts correct x 2.   Initiate routine postpartum care and orders. Mom to postpartum.  Baby to Couplet care / Skin to Skin.  FOB present and bedside and assisted CNM hand in hand with birth of Violet. Additional family members present at bedside.   Tina Hughes, CNM [facility], [facility] 10/14/2017, 9:16 AM

## 2017-08-25 ENCOUNTER — Ambulatory Visit
Admission: EM | Admit: 2017-08-25 | Discharge: 2017-08-25 | Disposition: A | Discharge status: Home / Self Care | Payer: No Typology Code available for payment source | Attending: Family Medicine | Admitting: Family Medicine

## 2017-08-25 ENCOUNTER — Encounter: Payer: Self-pay | Admitting: *Deleted

## 2017-08-25 DIAGNOSIS — Z331 Pregnant state, incidental: Secondary | ICD-10-CM | POA: Diagnosis not present

## 2017-08-25 DIAGNOSIS — R05 Cough: Secondary | ICD-10-CM

## 2017-08-25 DIAGNOSIS — J069 Acute upper respiratory infection, unspecified: Secondary | ICD-10-CM | POA: Diagnosis not present

## 2017-08-25 NOTE — Discharge Instructions (Signed)
Take medication as prescribed. Rest. Drink plenty of fluids.  ° °Follow up with your primary care physician this week as needed. Return to Urgent care for new or worsening concerns.  ° °

## 2017-08-25 NOTE — ED Triage Notes (Signed)
Runny nose, head congestion, non-productive cough, x2 days.

## 2017-08-25 NOTE — ED Provider Notes (Signed)
[facility] ____________________________________________  Time seen: Approximately 9:54 AM  I have reviewed the triage vital signs and the nursing notes.   HISTORY  Chief Complaint Cough and Nasal Congestion   HPI Tina Hughes is a 27 y.o. female [redacted] weeks pregnant presenting for evaluation of 2 days of runny nose and nasal congestion.  States occasional cough.  States her son sick with similar complaints just prior to her sickness onset.   states occasionally scratchy throat, but otherwise denies sore throat.  Reports is continue to eat and drink well.  He denies breathing changes.  Denies associated abdominal pain dysuria, vaginal bleeding, vaginal leaking.  Reports continues to feel baby move well.  States did try Robitussin over-the-counter, some improvement, no resolution.  Again denies any accompanying fevers.  Reports otherwise feels well.  Denies chest pain, shortness of breath, abdominal pain, dysuria, or rash. Denies recent sickness. Denies recent antibiotic use.   OBGYN: [place]  Past Medical History:  Diagnosis Date  . Anxiety   . Vegetarian     Patient Active Problem List   Diagnosis Date Noted  . Rh negative state in antepartum period 03/31/2017  . Anxiety 03/31/2017  . Vegetarian diet 03/31/2017  . GBS bacteriuria 03/14/2017    History reviewed. No pertinent surgical history.   No current facility-administered medications for this encounter.   Current Outpatient Medications:  .  magnesium oxide (MAG-OX) 400 (241.3 Mg) MG tablet, Take 1 tablet (400 mg total) by mouth 2 (two) times daily., Disp: 60 tablet, Rfl: 5 .  UNABLE TO FIND, Med Name: [facility]'s bounty Prenatal multi healthy mom, Disp: , Rfl:   Allergies Patient has no known allergies.  Family History  Problem Relation Age of Onset  . Cancer Paternal Grandmother        skin cancer    Social History Social History   Tobacco Use  . Smoking status: Former Research scientist (life sciences)  . Smokeless  tobacco: Never Used  Substance Use Topics  . Alcohol use: No    Comment: occasional  . Drug use: No    Review of Systems Constitutional: No fever/chills Eyes: No visual changes. ENT: As above. Cardiovascular: Denies chest pain. Respiratory: Denies shortness of breath. Gastrointestinal: No abdominal pain.  Genitourinary: Negative for dysuria. Musculoskeletal: Negative for back pain. Skin: Negative for rash.   ____________________________________________   PHYSICAL EXAM:  VITAL SIGNS: ED Triage Vitals  Enc Vitals Group     BP 08/25/17 0909 116/71     Pulse Rate 08/25/17 0909 86     Resp 08/25/17 0909 16     Temp 08/25/17 0909 98.6 F (37 C)     Temp Source 08/25/17 0909 Oral     SpO2 08/25/17 0909 100 %     Weight 08/25/17 0911 161 lb (73 kg)     Height 08/25/17 0911 5\' 6"  (1.676 m)     Head Circumference --      Peak Flow --      Pain Score --      Pain Loc --      Pain Edu? --      Excl. in [place]? --     Fetal heart tones: 151bpm per CMA Constitutional: Alert and oriented. Well appearing and in no acute distress. Eyes: Conjunctivae are normal. Head: Atraumatic. No sinus tenderness to palpation. No swelling. No erythema.  Ears: no erythema, normal TMs bilaterally.   Nose:Nasal congestion with clear rhinorrhea  Mouth/Throat: Mucous membranes are moist. No pharyngeal erythema. No tonsillar swelling  or exudate.  Neck: No stridor.  No cervical spine tenderness to palpation. Hematological/Lymphatic/Immunilogical: No cervical lymphadenopathy. Cardiovascular: Normal rate, regular rhythm. Grossly normal heart sounds.  Good peripheral circulation. Respiratory: Normal respiratory effort.  No retractions. No wheezes, rales or rhonchi. Good air movement.  Gastrointestinal: Gravid abdomen. Musculoskeletal: Ambulatory with steady gait.  Neurologic:  Normal speech and language. No gait instability. Skin:  Skin appears warm, dry and intact. No rash noted. Psychiatric: Mood and  affect are normal. Speech and behavior are normal.  ___________________________________________   LABS (all labs ordered are listed, but only abnormal results are displayed)   PROCEDURES Procedures    INITIAL IMPRESSION / ASSESSMENT AND PLAN / ED COURSE  Pertinent labs & imaging results that were available during my care of the patient were reviewed by me and considered in my medical decision making (see chart for details).  Well-appearing patient no acute distress.  Suspect viral upper respiratory infection.  Encouraged rest, fluids, supportive care, over-the-counter Claritin or plain Mucinex if needed.  Follow-up with OB/GYN or primary as needed.  Discussed follow up with Primary care physician this week. Discussed follow up and return parameters including no resolution or any worsening concerns. Patient verbalized understanding and agreed to plan.   ____________________________________________   FINAL CLINICAL IMPRESSION(S) / ED DIAGNOSES  Final diagnoses:  Upper respiratory tract infection, unspecified type     ED Discharge Orders    None       Note: This dictation was prepared with Dragon dictation along with smaller phrase technology. Any transcriptional errors that result from this process are unintentional.         Marylene Land, NP 08/25/17 [phone]

## 2017-08-30 ENCOUNTER — Encounter: Payer: Self-pay | Admitting: Certified Nurse Midwife

## 2017-08-30 ENCOUNTER — Encounter: Payer: No Typology Code available for payment source | Admitting: Certified Nurse Midwife

## 2017-08-30 ENCOUNTER — Ambulatory Visit (INDEPENDENT_AMBULATORY_CARE_PROVIDER_SITE_OTHER): Payer: No Typology Code available for payment source | Admitting: Certified Nurse Midwife

## 2017-08-30 VITALS — BP 122/72 | HR 82 | Wt 166.0 lb

## 2017-08-30 DIAGNOSIS — Z3483 Encounter for supervision of other normal pregnancy, third trimester: Secondary | ICD-10-CM

## 2017-08-30 NOTE — Progress Notes (Signed)
ROB,  Doing well, feels fetal movement and occasional contractions. Asks about fetal hiccups. Reassurance given. Anticipatory guidance for 36 wk cultures.  Follow up in 2 wks.   Philip Aspen, CNM

## 2017-08-30 NOTE — Patient Instructions (Signed)

## 2017-08-30 NOTE — Progress Notes (Signed)
ROB- Patient feels well with no complaints.

## 2017-09-06 ENCOUNTER — Telehealth: Payer: Self-pay | Admitting: Certified Nurse Midwife

## 2017-09-06 NOTE — Telephone Encounter (Signed)
Pt states last nite she was seeing spots with bright colors for about 15 minutes. NO other sx. Mild nausea later in the evening. She has h/o of h/a this pregnancy. NO migraines noted. BP all wnl. She states she drinks plenty of h20. Pos fm. NO vb or ctx.  Advised pt to monitor for now. If sx recur to take tylenol es q6. F/u with provider at [place].

## 2017-09-06 NOTE — Telephone Encounter (Signed)
The patient called and stated that she wants to speak with a nurse as soon as possible. The patient stated that her vision was off yesterday, she was seeing colors and shapes, and the patient was also dizzy.No other information was disclosed. Please advise.

## 2017-09-11 ENCOUNTER — Telehealth: Payer: Self-pay | Admitting: *Deleted

## 2017-09-11 NOTE — Telephone Encounter (Signed)
Pt is having h/a not relived with tylenol. Blurred vision at times. Bp elevated (144/77). Pt is staying hydrated. Mild nausea. Appt moved from 1/25 to 1/22 at 11:00.

## 2017-09-11 NOTE — Telephone Encounter (Signed)
Patient called and states she is experiencing visual changes since last week.  She states she has been experiencing blurry vision yesterday afternoon and today. She took her BP at [facility] and it was 144/77 she also had a headache that didn't go away after taking tylenol. Patient is requesting a call back on what to do. Her contact # is [phone]. Please advise. Thank you

## 2017-09-12 ENCOUNTER — Encounter: Payer: Self-pay | Admitting: Certified Nurse Midwife

## 2017-09-12 ENCOUNTER — Ambulatory Visit (INDEPENDENT_AMBULATORY_CARE_PROVIDER_SITE_OTHER): Payer: No Typology Code available for payment source | Admitting: Certified Nurse Midwife

## 2017-09-12 ENCOUNTER — Telehealth: Payer: Self-pay

## 2017-09-12 VITALS — BP 159/78 | HR 83 | Wt 170.1 lb

## 2017-09-12 DIAGNOSIS — Z3483 Encounter for supervision of other normal pregnancy, third trimester: Secondary | ICD-10-CM | POA: Diagnosis not present

## 2017-09-12 DIAGNOSIS — R51 Headache: Secondary | ICD-10-CM

## 2017-09-12 DIAGNOSIS — O163 Unspecified maternal hypertension, third trimester: Secondary | ICD-10-CM

## 2017-09-12 DIAGNOSIS — R519 Headache, unspecified: Secondary | ICD-10-CM

## 2017-09-12 DIAGNOSIS — O26893 Other specified pregnancy related conditions, third trimester: Secondary | ICD-10-CM

## 2017-09-12 LAB — CBC
HEMOGLOBIN: 11.6 g/dL (ref 11.1–15.9)
Hematocrit: 33.3 % — ABNORMAL LOW (ref 34.0–46.6)
MCH: 28.9 pg (ref 26.6–33.0)
MCHC: 34.8 g/dL (ref 31.5–35.7)
MCV: 83 fL (ref 79–97)
Platelets: 248 10*3/uL (ref 150–379)
RBC: 4.02 x10E6/uL (ref 3.77–5.28)
RDW: 13.5 % (ref 12.3–15.4)
WBC: 7.4 10*3/uL (ref 3.4–10.8)

## 2017-09-12 LAB — COMPREHENSIVE METABOLIC PANEL
ALBUMIN: 3.6 g/dL (ref 3.5–5.5)
ALK PHOS: 116 IU/L (ref 39–117)
ALT: 12 IU/L (ref 0–32)
AST: 18 IU/L (ref 0–40)
Albumin/Globulin Ratio: 1.2 (ref 1.2–2.2)
BUN / CREAT RATIO: 11 (ref 9–23)
BUN: 6 mg/dL (ref 6–20)
Bilirubin Total: 0.2 mg/dL (ref 0.0–1.2)
CALCIUM: 9.2 mg/dL (ref 8.7–10.2)
CO2: 21 mmol/L (ref 20–29)
CREATININE: 0.56 mg/dL — AB (ref 0.57–1.00)
Chloride: 102 mmol/L (ref 96–106)
GFR calc Af Amer: 147 mL/min/{1.73_m2} (ref >59)
GFR calc non Af Amer: 127 mL/min/{1.73_m2} (ref >59)
GLUCOSE: 94 mg/dL (ref 65–99)
Globulin, Total: 3 g/dL (ref 1.5–4.5)
Potassium: 4 mmol/L (ref 3.5–5.2)
Sodium: 137 mmol/L (ref 134–144)
TOTAL PROTEIN: 6.6 g/dL (ref 6.0–8.5)

## 2017-09-12 LAB — POCT URINALYSIS DIPSTICK
Bilirubin, UA: NEGATIVE
Blood, UA: NEGATIVE
Glucose, UA: NEGATIVE
Ketones, UA: NEGATIVE
LEUKOCYTES UA: NEGATIVE
NITRITE UA: NEGATIVE
PH UA: 7 (ref 5.0–8.0)
PROTEIN UA: NEGATIVE
Spec Grav, UA: <=1.005 — AB (ref 1.010–1.025)
Urobilinogen, UA: 0.2 E.U./dL

## 2017-09-12 LAB — PROTEIN / CREATININE RATIO, URINE
Creatinine, Urine: 26.6 mg/dL
PROTEIN/CREAT RATIO: 214 mg/g{creat} — AB (ref 0–200)
Protein, Ur: 5.7 mg/dL

## 2017-09-12 LAB — URIC ACID: URIC ACID: 3 mg/dL (ref 2.5–7.1)

## 2017-09-12 NOTE — Progress Notes (Signed)
ROB-Reports headache that starts behind eyes and radiates to back of head along with intermittent blurred vision, fatigue, and weight gain. Tylenol helps, but doesn't completely take the pain away. Repeat blood pressures: 138/83, 120/73, 128/77. NST performed today was reviewed and was found to be reactive. Baseline FHR 140 bpm with moderate variability, accelerations present, and no decelerations noted. Encouraged daily kick counts. Will obtained CBC, CMP, UA, and protein/creatinine ratio STAT today. Discussed home treatment measures including extra strength Tylenol, water, and caffeine intake. Reviewed red flag symptoms and when to call. RTC x 1 week for ROB or sooner if needed.

## 2017-09-12 NOTE — Progress Notes (Signed)
Pt is here with c/o headcahes tylenol helps but it take awhile to become effective. C/o blurred vision,elevated BP, weight gain fatigued.

## 2017-09-12 NOTE — Patient Instructions (Addendum)
Skin Conditions During Pregnancy Pregnancy affects many parts of your body. One part is your skin. Most skin problems that develop during pregnancy are not serious and are considered a normal part of pregnancy. They go away on their own after the baby is born. Other skin problems may need treatment. What type of skin problems can develop during pregnancy?  Stretch marks. Stretch marks are purple or pink lines on the skin. They may appear on the belly, breasts, thighs, or buttocks. Stretch marks are caused by weight gain that causes the skin to stretch. Stretch marks do not cause problems. Almost all women get them during pregnancy.  Darkening of the skin (hyperpigmentation). The darkening may occur in patches or as a line. Patches may appear on the face, nipples, or genital area. Lines often stretch from the belly button to the pubic area. Hyperpigmentation develops in almost all pregnant women. It is more severe in women with a dark complexion.  Spider angiomas. These are tiny pink or red lines that go out from a center point, like the legs of a spider. Usually, they are on the face, neck, and arms. They do not cause problems. They are most common in women with light complexions.  Palmar erythema. This is a reddening of the palms. It is most common in women with light complexions.  Swelling and redness. This can occur on the face, eyelids, fingers, or toes.  Pruritic urticarial papules and plaques of pregnancy (PUPPP). This is a rash that is itchy, red, and has tiny blisters. The cause is unknown. It usually starts on the abdomen and may affect the arms or legs. It does not affect the face. It usually begins later in pregnancy. About a third of all pregnant women develop this condition. There are no associated problems to the fetus with this rash. Sometimes, oral steroids are used to calm down the itch. The rash clears after the baby is born.  Prurigo of pregnancy. This is a disease in which red  patches and bumps appear on the arms and legs. The cause is unknown. The patches and bumps clear after the baby is born. About a third of pregnant women develop this disease.  Acne. Pimples may develop, including in women who have had clear skin for a long time.  Skin tags. These are small flaps of skin that stick out from the body. They may grow or become darker during pregnancy. They are usually harmless.  Moles. These are flat or slightly raised growths. They are usually round and pink or brown. They may grow or become darker during pregnancy.  Intrahepatic cholestasis of pregnancy. This is a rare condition that causes itchy skin. It may run in families. It increases the risk of complications for the fetus. This condition usually resolves after delivery. It can recur with subsequent pregnancies.  Impetigo herpetiformis. This is a form of a severe skin disease called pustular psoriasis. Usually, delivery is the only method of resolving the condition.  Pruritic folliculitis of pregnancy. This is a rare condition that causes pimple-like skin growths. It develops in the middle or later stages of pregnancy. Its cause is unknown.It usually resolves 2-3 weeks after delivery.  Pemphigoid gestationis. This is a very rare autoimmune disease. It causes a severely itchy rash and blisters. The rash does not appear on the face, scalp, or inside of the mouth. It usually resolves 3 months after delivery. It may recur with subsequent pregnancies. Some pre-existing skin conditions, such as atopic dermatitis, may become worse  during pregnancy. Follow these instructions at home: Different conditions may have different instructions. In general:  Follow all your health care provider's directions about medicines to treat skin problems while you are pregnant. Do not use any over-the-counter medicines (including medicated creams and lotions) until you have checked with your health care provider. Many medicines are not  safe to use when you are pregnant.  Avoid time in the sun. This will help keep your skin from darkening. When you must be outside, use sunscreen and wear a hat with a wide brim to protect your face. The sunscreen should have a SPF of at least 79. This may help limit dark spots that develop when the skin is exposed to the sun.  To avoid problems from stretched skin: ? Do not sit or stand for long periods of time. ? Exercise regularly. This helps keep your skin in good condition.  Use a gentle soap. This helps prevent acne.  Do not get too hot or too sweaty. This makes some skin rashes worse.  Wear loose clothes made of a soft fabric. This prevents skin irritation.  For itching, add oatmeal or cornstarch to your bathwater.  Use a skin moisturizer. Ask your health care provider for suggestions.  This information is not intended to replace advice given to you by your health care provider. Make sure you discuss any questions you have with your health care provider. Document Released: 09/10/2010 Document Revised: 01/14/2016 Document Reviewed: 05/20/2013 Elsevier Interactive Patient Education  2018 [employer]. Preeclampsia and Eclampsia Preeclampsia is a serious condition that develops only during pregnancy. It is also called toxemia of pregnancy. This condition causes high blood pressure along with other symptoms, such as swelling and headaches. These symptoms may develop as the condition gets worse. Preeclampsia may occur at 20 weeks of pregnancy or later. Diagnosing and treating preeclampsia early is very important. If not treated early, it can cause serious problems for you and your baby. One problem it can lead to is eclampsia, which is a condition that causes muscle jerking or shaking (convulsions or seizures) in the mother. Delivering your baby is the best treatment for preeclampsia or eclampsia. Preeclampsia and eclampsia symptoms usually go away after your baby is born. What are the  causes? The cause of preeclampsia is not known. What increases the risk? The following risk factors make you more likely to develop preeclampsia:  Being pregnant for the first time.  Having had preeclampsia during a past pregnancy.  Having a family history of preeclampsia.  Having high blood pressure.  Being pregnant with twins or triplets.  Being 43 or older.  Being African-American.  Having kidney disease or diabetes.  Having medical conditions such as lupus or blood diseases.  Being very overweight (obese).  What are the signs or symptoms? The earliest signs of preeclampsia are:  High blood pressure.  Increased protein in your urine. Your health care provider will check for this at every visit before you give birth (prenatal visit).  Other symptoms that may develop as the condition gets worse include:  Severe headaches.  Sudden weight gain.  Swelling of the hands, face, legs, and feet.  Nausea and vomiting.  Vision problems, such as blurred or double vision.  Numbness in the face, arms, legs, and feet.  Urinating less than usual.  Dizziness.  Slurred speech.  Abdominal pain, especially upper abdominal pain.  Convulsions or seizures.  Symptoms generally go away after giving birth. How is this diagnosed? There are no screening tests  for preeclampsia. Your health care provider will ask you about symptoms and check for signs of preeclampsia during your prenatal visits. You may also have tests that include:  Urine tests.  Blood tests.  Checking your blood pressure.  Monitoring your baby's heart rate.  Ultrasound.  How is this treated? You and your health care provider will determine the treatment approach that is best for you. Treatment may include:  Having more frequent prenatal exams to check for signs of preeclampsia, if you have an increased risk for preeclampsia.  Bed rest.  Reducing how much salt (sodium) you eat.  Medicine to lower  your blood pressure.  Staying in the hospital, if your condition is severe. There, treatment will focus on controlling your blood pressure and the amount of fluids in your body (fluid retention).  You may need to take medicine (magnesium sulfate) to prevent seizures. This medicine may be given as an injection or through an IV tube.  Delivering your baby early, if your condition gets worse. You may have your labor started with medicine (induced), or you may have a cesarean delivery.  Follow these instructions at home: Eating and drinking   Drink enough fluid to keep your urine clear or pale yellow.  Eat a healthy diet that is low in sodium. Do not add salt to your food. Check nutrition labels to see how much sodium a food or beverage contains.  Avoid caffeine. Lifestyle  Do not use any products that contain nicotine or tobacco, such as cigarettes and e-cigarettes. If you need help quitting, ask your health care provider.  Do not use alcohol or drugs.  Avoid stress as much as possible. Rest and get plenty of sleep. General instructions  Take over-the-counter and prescription medicines only as told by your health care provider.  When lying down, lie on your side. This keeps pressure off of your baby.  When sitting or lying down, raise (elevate) your feet. Try putting some pillows underneath your lower legs.  Exercise regularly. Ask your health care provider what kinds of exercise are best for you.  Keep all follow-up and prenatal visits as told by your health care provider. This is important. How is this prevented? To prevent preeclampsia or eclampsia from developing during another pregnancy:  Get proper medical care during pregnancy. Your health care provider may be able to prevent preeclampsia or diagnose and treat it early.  Your health care provider may have you take a low-dose aspirin or a calcium supplement during your next pregnancy.  You may have tests of your blood  pressure and kidney function after giving birth.  Maintain a healthy weight. Ask your health care provider for help managing weight gain during pregnancy.  Work with your health care provider to manage any long-term (chronic) health conditions you have, such as diabetes or kidney problems.  Contact a health care provider if:  You gain more weight than expected.  You have headaches.  You have nausea or vomiting.  You have abdominal pain.  You feel dizzy or light-headed. Get help right away if:  You develop sudden or severe swelling anywhere in your body. This usually happens in the legs.  You gain 5 lbs (2.3 kg) or more during one week.  You have severe: ? Abdominal pain. ? Headaches. ? Dizziness. ? Vision problems. ? Confusion. ? Nausea or vomiting.  You have a seizure.  You have trouble moving any part of your body.  You develop numbness in any part of your body.  You have trouble speaking.  You have any abnormal bleeding.  You pass out. This information is not intended to replace advice given to you by your health care provider. Make sure you discuss any questions you have with your health care provider. Document Released: 08/05/2000 Document Revised: 04/05/2016 Document Reviewed: 03/14/2016 Elsevier Interactive Patient Education  2018 [employer]. Hypertension During Pregnancy Hypertension is also called high blood pressure. High blood pressure means that the force of your blood moving in your body is too strong. When you are pregnant, this condition should be watched carefully. It can cause problems for you and your baby. Follow these instructions at home: Eating and drinking  Drink enough fluid to keep your pee (urine) clear or pale yellow.  Eat healthy foods that are low in salt (sodium). ? Do not add salt to your food. ? Check labels on foods and drinks to see much salt is in them. Look on the label where you see "Sodium." Lifestyle  Do not use any  products that contain nicotine or tobacco, such as cigarettes and e-cigarettes. If you need help quitting, ask your doctor.  Do not use alcohol.  Avoid caffeine.  Avoid stress. Rest and get plenty of sleep. General instructions  Take over-the-counter and prescription medicines only as told by your doctor.  While lying down, lie on your left side. This keeps pressure off your baby.  While sitting or lying down, raise (elevate) your feet. Try putting some pillows under your lower legs.  Exercise regularly. Ask your doctor what kinds of exercise are best for you.  Keep all prenatal and follow-up visits as told by your doctor. This is important. Contact a doctor if:  You have symptoms that your doctor told you to watch for, such as: ? Fever. ? Throwing up (vomiting). ? Headache. Get help right away if:  You have very bad pain in your belly (abdomen).  You are throwing up, and this does not get better with treatment.  You suddenly get swelling in your hands, ankles, or face.  You gain 4 lb (1.8 kg) or more in 1 week.  You get bleeding from your vagina.  You have blood in your pee.  You do not feel your baby moving as much as normal.  You have a change in vision.  You have muscle twitching or sudden tightening (spasms).  You have trouble breathing.  Your lips or fingernails turn blue. This information is not intended to replace advice given to you by your health care provider. Make sure you discuss any questions you have with your health care provider. Document Released: 09/10/2010 Document Revised: 04/19/2016 Document Reviewed: 04/19/2016 Elsevier Interactive Patient Education  2018 [employer]. Common Medications Safe in Pregnancy  Acne:      Constipation:  Benzoyl Peroxide     Colace  Clindamycin      Dulcolax Suppository  Topica Erythromycin     Fibercon  Salicylic Acid      Metamucil         Miralax AVOID:         Senakot   Accutane    Cough:  Retin-A       Cough Drops  Tetracycline      Phenergan w/ Codeine if Rx  Minocycline      Robitussin (Plain & DM)  Antibiotics:     Crabs/Lice:  Ceclor       RID  Cephalosporins    AVOID:  E-Mycins      Kwell  Keflex  Macrobid/Macrodantin   Diarrhea:  Penicillin      Kao-Pectate  Zithromax      Imodium AD         PUSH FLUIDS AVOID:       Cipro     Fever:  Tetracycline      Tylenol (Regular or Extra  Minocycline       Strength)  Levaquin      Extra Strength-Do not          Exceed 8 tabs/24 hrs Caffeine:        <247m/day (equiv. To 1 cup of coffee or  approx. 3 12 oz sodas)         Gas: Cold/Hayfever:       Gas-X  Benadryl      Mylicon  Claritin       Phazyme  **Claritin-D        Chlor-Trimeton    Headaches:  Dimetapp      ASA-Free Excedrin  Drixoral-Non-Drowsy     Cold Compress  Mucinex (Guaifenasin)     Tylenol (Regular or Extra  Sudafed/Sudafed-12 Hour     Strength)  **Sudafed PE Pseudoephedrine   Tylenol Cold & Sinus     Vicks Vapor Rub  Zyrtec  **AVOID if Problems With Blood Pressure         Heartburn: Avoid lying down for at least 1 hour after meals  Aciphex      Maalox     Rash:  Milk of Magnesia     Benadryl    Mylanta       1% Hydrocortisone Cream  Pepcid  Pepcid Complete   Sleep Aids:  Prevacid      Ambien   Prilosec       Benadryl  Rolaids       Chamomile Tea  Tums (Limit 4/day)     Unisom  Zantac       Tylenol PM         Warm milk-add vanilla or  Hemorrhoids:       Sugar for taste  Anusol/Anusol H.C.  (RX: Analapram 2.5%)  Sugar Substitutes:  Hydrocortisone OTC     Ok in moderation  Preparation H      Tucks        Vaseline lotion applied to tissue with wiping    Herpes:     Throat:  Acyclovir      Oragel  Famvir  Valtrex     Vaccines:         Flu Shot Leg Cramps:       *Gardasil  Benadryl      Hepatitis A         Hepatitis B Nasal Spray:       Pneumovax  Saline Nasal Spray     Polio  Booster         Tetanus Nausea:       Tuberculosis test or PPD  Vitamin B6 25 mg TID   AVOID:    Dramamine      *Gardasil  Emetrol       Live Poliovirus  Ginger Root 250 mg QID    MMR (measles, mumps &  High Complex Carbs @ Bedtime    rebella)  Sea Bands-Accupressure    Varicella (Chickenpox)  Unisom 1/2 tab TID     *No known complications           If received before Pain:         Known pregnancy;   Darvocet  Resume series after  Lortab        Delivery  Percocet    Yeast:   Tramadol      Femstat  Tylenol 3      Gyne-lotrimin  Ultram       Monistat  Vicodin           MISC:         All Sunscreens           Hair Coloring/highlights          Insect Repellant's          (Including DEET)         Mystic Tans  Fetal Movement Counts Patient Name: ________________________________________________ Patient Due Date: ____________________ What is a fetal movement count? A fetal movement count is the number of times that you feel your baby move during a certain amount of time. This may also be called a fetal kick count. A fetal movement count is recommended for every pregnant woman. You may be asked to start counting fetal movements as early as week 28 of your pregnancy. Pay attention to when your baby is most active. You may notice your baby's sleep and wake cycles. You may also notice things that make your baby move more. You should do a fetal movement count:  When your baby is normally most active.  At the same time each day.  A good time to count movements is while you are resting, after having something to eat and drink. How do I count fetal movements? 1. Find a quiet, comfortable area. Sit, or lie down on your side. 2. Write down the date, the start time and stop time, and the number of movements that you felt between those two times. Take this information with you to your health care visits. 3. For 2 hours, count kicks, flutters, swishes, rolls, and jabs. You should feel at least  10 movements during 2 hours. 4. You may stop counting after you have felt 10 movements. 5. If you do not feel 10 movements in 2 hours, have something to eat and drink. Then, keep resting and counting for 1 hour. If you feel at least 4 movements during that hour, you may stop counting. Contact a health care provider if:  You feel fewer than 4 movements in 2 hours.  Your baby is not moving like he or she usually does. Date: ____________ Start time: ____________ Stop time: ____________ Movements: ____________ Date: ____________ Start time: ____________ Stop time: ____________ Movements: ____________ Date: ____________ Start time: ____________ Stop time: ____________ Movements: ____________ Date: ____________ Start time: ____________ Stop time: ____________ Movements: ____________ Date: ____________ Start time: ____________ Stop time: ____________ Movements: ____________ Date: ____________ Start time: ____________ Stop time: ____________ Movements: ____________ Date: ____________ Start time: ____________ Stop time: ____________ Movements: ____________ Date: ____________ Start time: ____________ Stop time: ____________ Movements: ____________ Date: ____________ Start time: ____________ Stop time: ____________ Movements: ____________ This information is not intended to replace advice given to you by your health care provider. Make sure you discuss any questions you have with your health care provider. Document Released: 09/07/2006 Document Revised: 04/06/2016 Document Reviewed: 09/17/2015 Elsevier Interactive Patient Education  [employer].

## 2017-09-12 NOTE — Telephone Encounter (Signed)
Attempted to contact pt- unable to leave a message due to full mailbox. Mychart message sent.

## 2017-09-14 ENCOUNTER — Encounter: Payer: Self-pay | Admitting: Certified Nurse Midwife

## 2017-09-14 ENCOUNTER — Other Ambulatory Visit: Payer: Self-pay | Admitting: *Deleted

## 2017-09-14 MED ORDER — BUTALBITAL-APAP-CAFFEINE 50-325-40 MG PO TABS: 1.0000 | ORAL_TABLET | Freq: Four times a day (QID) | ORAL | 0 refills | Status: DC | PRN

## 2017-09-15 ENCOUNTER — Encounter: Payer: No Typology Code available for payment source | Admitting: Certified Nurse Midwife

## 2017-09-19 ENCOUNTER — Ambulatory Visit (INDEPENDENT_AMBULATORY_CARE_PROVIDER_SITE_OTHER): Payer: No Typology Code available for payment source | Admitting: Certified Nurse Midwife

## 2017-09-19 ENCOUNTER — Encounter: Payer: Self-pay | Admitting: Certified Nurse Midwife

## 2017-09-19 VITALS — BP 140/62 | HR 86 | Wt 171.2 lb

## 2017-09-19 DIAGNOSIS — Z3483 Encounter for supervision of other normal pregnancy, third trimester: Secondary | ICD-10-CM | POA: Diagnosis not present

## 2017-09-19 LAB — POCT URINALYSIS DIPSTICK
BILIRUBIN UA: NEGATIVE
Glucose, UA: NEGATIVE
Ketones, UA: NEGATIVE
LEUKOCYTES UA: NEGATIVE
Nitrite, UA: NEGATIVE
RBC UA: NEGATIVE
SPEC GRAV UA: 1.01 (ref 1.010–1.025)
Urobilinogen, UA: 0.2 E.U./dL
pH, UA: 7.5 (ref 5.0–8.0)

## 2017-09-19 NOTE — Patient Instructions (Signed)
Group B Streptococcus Infection During Pregnancy Group B Streptococcus (GBS) is a type of bacteria (Streptococcus agalactiae) that is often found in healthy people, commonly in the rectum, vagina, and intestines. In people who are healthy and not pregnant, the bacteria rarely cause serious illness or complications. However, women who test positive for GBS during pregnancy can pass the bacteria to their baby during childbirth, which can cause serious infection in the baby after birth. Women with GBS may also have infections during their pregnancy or immediately after childbirth, such as such as urinary tract infections (UTIs) or infections of the uterus (uterine infections). Having GBS also increases a woman's risk of complications during pregnancy, such as early (preterm) labor or delivery, miscarriage, or stillbirth. Routine testing (screening) for GBS is recommended for all pregnant women. What increases the risk? You may have a higher risk for GBS infection during pregnancy if you had one during a past pregnancy. What are the signs or symptoms? In most cases, GBS infection does not cause symptoms in pregnant women. Signs and symptoms of a possible GBS-related infection may include:  Labor starting before the 37th week of pregnancy.  A UTI or bladder infection, which may cause: ? Fever. ? Pain or burning during urination. ? Frequent urination.  Fever during labor, along with: ? Bad-smelling discharge. ? Uterine tenderness. ? Rapid heartbeat in the mother, baby, or both.  Rare but serious symptoms of a possible GBS-related infection in women include:  Blood infection (septicemia). This may cause fever, chills, or confusion.  Lung infection (pneumonia). This may cause fever, chills, cough, rapid breathing, difficulty breathing, or chest pain.  Bone, joint, skin, or soft tissue infection.  How is this diagnosed? You may be screened for GBS between week 35 and week 37 of your pregnancy. If  you have symptoms of preterm labor, you may be screened earlier. This condition is diagnosed based on lab test results from:  A swab of fluid from the vagina and rectum.  A urine sample.  How is this treated? This condition is treated with antibiotic medicine. When you go into labor, or as soon as your water breaks (your membranes rupture), you will be given antibiotics through an IV tube. Antibiotics will continue until after you give birth. If you are having a cesarean delivery, you do not need antibiotics unless your membranes have already ruptured. Follow these instructions at home:  Take over-the-counter and prescription medicines only as told by your health care provider.  Take your antibiotic medicine as told by your health care provider. Do not stop taking the antibiotic even if you start to feel better.  Keep all pre-birth (prenatal) visits and follow-up visits as told by your health care provider. This is important. Contact a health care provider if:  You have pain or burning when you urinate.  You have to urinate frequently.  You have a fever or chills.  You develop a bad-smelling vaginal discharge. Get help right away if:  Your membranes rupture.  You go into labor.  You have severe pain in your abdomen.  You have difficulty breathing.  You have chest pain. This information is not intended to replace advice given to you by your health care provider. Make sure you discuss any questions you have with your health care provider. Document Released: 11/15/2007 Document Revised: 03/04/2016 Document Reviewed: 03/03/2016 Elsevier Interactive Patient Education  2018 [employer]  

## 2017-09-19 NOTE — Progress Notes (Signed)
Pt is here for an [place] visit. Due for GBS/GCC.

## 2017-09-19 NOTE — Progress Notes (Signed)
Tina Hughes, doing well. No complaints. She is feeling fetal movement. Has a few contractions. GBS today. SVE 1.5/40/high. She is requesting a note stating that she is starting her maternity leavy on Monday February 4th. Note given. Follow up 1 wk.    Philip Aspen, CNM

## 2017-09-21 LAB — STREP GP B NAA: Strep Gp B NAA: POSITIVE — AB

## 2017-09-21 LAB — GC/CHLAMYDIA PROBE AMP
Chlamydia trachomatis, NAA: NEGATIVE
Neisseria gonorrhoeae by PCR: NEGATIVE

## 2017-09-22 ENCOUNTER — Telehealth: Payer: Self-pay | Admitting: *Deleted

## 2017-09-22 ENCOUNTER — Encounter: Payer: Self-pay | Admitting: Certified Nurse Midwife

## 2017-09-22 NOTE — Telephone Encounter (Signed)
Patient called and states she loss her mucus plug and she wanted to make the provider know. Patient states if you have questions you can call her . Thank you

## 2017-09-22 NOTE — Telephone Encounter (Signed)
noted 

## 2017-09-26 ENCOUNTER — Ambulatory Visit (INDEPENDENT_AMBULATORY_CARE_PROVIDER_SITE_OTHER): Payer: No Typology Code available for payment source | Admitting: Certified Nurse Midwife

## 2017-09-26 VITALS — BP 116/80 | HR 82 | Wt 172.6 lb

## 2017-09-26 DIAGNOSIS — Z3483 Encounter for supervision of other normal pregnancy, third trimester: Secondary | ICD-10-CM

## 2017-09-26 LAB — POCT URINALYSIS DIPSTICK
Bilirubin, UA: NEGATIVE
Blood, UA: NEGATIVE
Glucose, UA: NEGATIVE
KETONES UA: NEGATIVE
Leukocytes, UA: NEGATIVE
NITRITE UA: NEGATIVE
PH UA: 5 (ref 5.0–8.0)
PROTEIN UA: NEGATIVE
SPEC GRAV UA: 1.015 (ref 1.010–1.025)
UROBILINOGEN UA: 0.2 U/dL

## 2017-09-26 NOTE — Progress Notes (Signed)
Pt is here for an ROB visit. 

## 2017-09-26 NOTE — Patient Instructions (Signed)
Vaginal Delivery Vaginal delivery means that you will give birth by pushing your baby out of your birth canal (vagina). A team of health care providers will help you before, during, and after vaginal delivery. Birth experiences are unique for every woman and every pregnancy, and birth experiences vary depending on where you choose to give birth. What should I do to prepare for my baby's birth? Before your baby is born, it is important to talk with your health care provider about:  Your labor and delivery preferences. These may include: ? Medicines that you may be given. ? How you will manage your pain. This might include non-medical pain relief techniques or injectable pain relief such as epidural analgesia. ? How you and your baby will be monitored during labor and delivery. ? Who may be in the labor and delivery room with you. ? Your feelings about surgical delivery of your baby (cesarean delivery, or C-section) if this becomes necessary. ? Your feelings about receiving donated blood through an IV tube (blood transfusion) if this becomes necessary.  Whether you are able: ? To take pictures or videos of the birth. ? To eat during labor and delivery. ? To move around, walk, or change positions during labor and delivery.  What to expect after your baby is born, such as: ? Whether delayed umbilical cord clamping and cutting is offered. ? Who will care for your baby right after birth. ? Medicines or tests that may be recommended for your baby. ? Whether breastfeeding is supported in your hospital or birth center. ? How long you will be in the hospital or birth center.  How any medical conditions you have may affect your baby or your labor and delivery experience.  To prepare for your baby's birth, you should also:  Attend all of your health care visits before delivery (prenatal visits) as recommended by your health care provider. This is important.  Prepare your home for your baby's  arrival. Make sure that you have: ? Diapers. ? Baby clothing. ? Feeding equipment. ? Safe sleeping arrangements for you and your baby.  Install a car seat in your vehicle. Have your car seat checked by a certified car seat installer to make sure that it is installed safely.  Think about who will help you with your new baby at home for at least the first several weeks after delivery.  What can I expect when I arrive at the birth center or hospital? Once you are in labor and have been admitted into the hospital or birth center, your health care provider may:  Review your pregnancy history and any concerns you have.  Insert an IV tube into one of your veins. This is used to give you fluids and medicines.  Check your blood pressure, pulse, temperature, and heart rate (vital signs).  Check whether your bag of water (amniotic sac) has broken (ruptured).  Talk with you about your birth plan and discuss pain control options.  Monitoring Your health care provider may monitor your contractions (uterine monitoring) and your baby's heart rate (fetal monitoring). You may need to be monitored:  Often, but not continuously (intermittently).  All the time or for long periods at a time (continuously). Continuous monitoring may be needed if: ? You are taking certain medicines, such as medicine to relieve pain or make your contractions stronger. ? You have pregnancy or labor complications.  Monitoring may be done by:  Placing a special stethoscope or a handheld monitoring device on your abdomen to   check your baby's heartbeat, and feeling your abdomen for contractions. This method of monitoring does not continuously record your baby's heartbeat or your contractions.  Placing monitors on your abdomen (external monitors) to record your baby's heartbeat and the frequency and length of contractions. You may not have to wear external monitors all the time.  Placing monitors inside of your uterus  (internal monitors) to record your baby's heartbeat and the frequency, length, and strength of your contractions. ? Your health care provider may use internal monitors if he or she needs more information about the strength of your contractions or your baby's heart rate. ? Internal monitors are put in place by passing a thin, flexible wire through your vagina and into your uterus. Depending on the type of monitor, it may remain in your uterus or on your baby's head until birth. ? Your health care provider will discuss the benefits and risks of internal monitoring with you and will ask for your permission before inserting the monitors.  Telemetry. This is a type of continuous monitoring that can be done with external or internal monitors. Instead of having to stay in bed, you are able to move around during telemetry. Ask your health care provider if telemetry is an option for you.  Physical exam Your health care provider may perform a physical exam. This may include:  Checking whether your baby is positioned: ? With the head toward your vagina (head-down). This is most common. ? With the head toward the top of your uterus (head-up or breech). If your baby is in a breech position, your health care provider may try to turn your baby to a head-down position so you can deliver vaginally. If it does not seem that your baby can be born vaginally, your provider may recommend surgery to deliver your baby. In rare cases, you may be able to deliver vaginally if your baby is head-up (breech delivery). ? Lying sideways (transverse). Babies that are lying sideways cannot be delivered vaginally.  Checking your cervix to determine: ? Whether it is thinning out (effacing). ? Whether it is opening up (dilating). ? How low your baby has moved into your birth canal.  What are the three stages of labor and delivery?  Normal labor and delivery is divided into the following three stages: Stage 1  Stage 1 is the  longest stage of labor, and it can last for hours or days. Stage 1 includes: ? Early labor. This is when contractions may be irregular, or regular and mild. Generally, early labor contractions are more than 10 minutes apart. ? Active labor. This is when contractions get longer, more regular, more frequent, and more intense. ? The transition phase. This is when contractions happen very close together, are very intense, and may last longer than during any other part of labor.  Contractions generally feel mild, infrequent, and irregular at first. They get stronger, more frequent (about every 2-3 minutes), and more regular as you progress from early labor through active labor and transition.  Many women progress through stage 1 naturally, but you may need help to continue making progress. If this happens, your health care provider may talk with you about: ? Rupturing your amniotic sac if it has not ruptured yet. ? Giving you medicine to help make your contractions stronger and more frequent.  Stage 1 ends when your cervix is completely dilated to 4 inches (10 cm) and completely effaced. This happens at the end of the transition phase. Stage 2  Once   your cervix is completely effaced and dilated to 4 inches (10 cm), you may start to feel an urge to push. It is common for the body to naturally take a rest before feeling the urge to push, especially if you received an epidural or certain other pain medicines. This rest period may last for up to 1-2 hours, depending on your unique labor experience.  During stage 2, contractions are generally less painful, because pushing helps relieve contraction pain. Instead of contraction pain, you may feel stretching and burning pain, especially when the widest part of your baby's head passes through the vaginal opening (crowning).  Your health care provider will closely monitor your pushing progress and your baby's progress through the vagina during stage 2.  Your  health care provider may massage the area of skin between your vaginal opening and anus (perineum) or apply warm compresses to your perineum. This helps it stretch as the baby's head starts to crown, which can help prevent perineal tearing. ? In some cases, an incision may be made in your perineum (episiotomy) to allow the baby to pass through the vaginal opening. An episiotomy helps to make the opening of the vagina larger to allow more room for the baby to fit through.  It is very important to breathe and focus so your health care provider can control the delivery of your baby's head. Your health care provider may have you decrease the intensity of your pushing, to help prevent perineal tearing.  After delivery of your baby's head, the shoulders and the rest of the body generally deliver very quickly and without difficulty.  Once your baby is delivered, the umbilical cord may be cut right away, or this may be delayed for 1-2 minutes, depending on your baby's health. This may vary among health care providers, hospitals, and birth centers.  If you and your baby are healthy enough, your baby may be placed on your chest or abdomen to help maintain the baby's temperature and to help you bond with each other. Some mothers and babies start breastfeeding at this time. Your health care team will dry your baby and help keep your baby warm during this time.  Your baby may need immediate care if he or she: ? Showed signs of distress during labor. ? Has a medical condition. ? Was born too early (prematurely). ? Had a bowel movement before birth (meconium). ? Shows signs of difficulty transitioning from being inside the uterus to being outside of the uterus. If you are planning to breastfeed, your health care team will help you begin a feeding. Stage 3  The third stage of labor starts immediately after the birth of your baby and ends after you deliver the placenta. The placenta is an organ that develops  during pregnancy to provide oxygen and nutrients to your baby in the womb.  Delivering the placenta may require some pushing, and you may have mild contractions. Breastfeeding can stimulate contractions to help you deliver the placenta.  After the placenta is delivered, your uterus should tighten (contract) and become firm. This helps to stop bleeding in your uterus. To help your uterus contract and to control bleeding, your health care provider may: ? Give you medicine by injection, through an IV tube, by mouth, or through your rectum (rectally). ? Massage your abdomen or perform a vaginal exam to remove any blood clots that are left in your uterus. ? Empty your bladder by placing a thin, flexible tube (catheter) into your bladder. ? Encourage   you to breastfeed your baby. After labor is over, you and your baby will be monitored closely to ensure that you are both healthy until you are ready to go home. Your health care team will teach you how to care for yourself and your baby. This information is not intended to replace advice given to you by your health care provider. Make sure you discuss any questions you have with your health care provider. Document Released: 05/17/2008 Document Revised: 02/26/2016 Document Reviewed: 08/23/2015 Elsevier Interactive Patient Education  2018 [employer]  

## 2017-09-28 NOTE — Progress Notes (Signed)
ROB-Reports itching to stretch marks on belly; otherwise, doing fine. Discussed home treatment measures. Reviewed red flag symptoms and when to call. RTC x 1 week for ROB or sooner if needed.

## 2017-10-03 ENCOUNTER — Ambulatory Visit (INDEPENDENT_AMBULATORY_CARE_PROVIDER_SITE_OTHER): Payer: No Typology Code available for payment source | Admitting: Certified Nurse Midwife

## 2017-10-03 ENCOUNTER — Encounter: Payer: Self-pay | Admitting: Certified Nurse Midwife

## 2017-10-03 VITALS — BP 130/75 | HR 95 | Wt 172.3 lb

## 2017-10-03 DIAGNOSIS — Z3483 Encounter for supervision of other normal pregnancy, third trimester: Secondary | ICD-10-CM

## 2017-10-03 LAB — POCT URINALYSIS DIPSTICK
BILIRUBIN UA: NEGATIVE
GLUCOSE UA: NEGATIVE
Ketones, UA: NEGATIVE
Leukocytes, UA: NEGATIVE
Nitrite, UA: NEGATIVE
Protein, UA: NEGATIVE
RBC UA: NEGATIVE
Spec Grav, UA: 1.01 (ref 1.010–1.025)
Urobilinogen, UA: 0.2 E.U./dL
pH, UA: 6 (ref 5.0–8.0)

## 2017-10-03 NOTE — Progress Notes (Signed)
ROB , pt complains of decreased fetal movement this morning. Stating that the baby is usually active in the morning after breakfeast. Discussed fetal movement and how movement is perceived differently due to decrease space of movement. Pt verbalizes understanding. NST today . Baseline 130 moderate variability, Accelerations present. Decelerations absent. Contractions occasional. Reactive NST. Reassurance give. Follow up 1 wks.   Philip Aspen, CNM

## 2017-10-03 NOTE — Patient Instructions (Signed)
Braxton Hicks Contractions °Contractions of the uterus can occur throughout pregnancy, but they are not always a sign that you are in labor. You may have practice contractions called Braxton Hicks contractions. These false labor contractions are sometimes confused with true labor. °What are Braxton Hicks contractions? °Braxton Hicks contractions are tightening movements that occur in the muscles of the uterus before labor. Unlike true labor contractions, these contractions do not result in opening (dilation) and thinning of the cervix. Toward the end of pregnancy (32-34 weeks), Braxton Hicks contractions can happen more often and may become stronger. These contractions are sometimes difficult to tell apart from true labor because they can be very uncomfortable. You should not feel embarrassed if you go to the hospital with false labor. °Sometimes, the only way to tell if you are in true labor is for your health care provider to look for changes in the cervix. The health care provider will do a physical exam and may monitor your contractions. If you are not in true labor, the exam should show that your cervix is not dilating and your water has not broken. °If there are other health problems associated with your pregnancy, it is completely safe for you to be sent home with false labor. You may continue to have Braxton Hicks contractions until you go into true labor. °How to tell the difference between true labor and false labor °True labor °· Contractions last 30-70 seconds. °· Contractions become very regular. °· Discomfort is usually felt in the top of the uterus, and it spreads to the lower abdomen and low back. °· Contractions do not go away with walking. °· Contractions usually become more intense and increase in frequency. °· The cervix dilates and gets thinner. °False labor °· Contractions are usually shorter and not as strong as true labor contractions. °· Contractions are usually irregular. °· Contractions  are often felt in the front of the lower abdomen and in the groin. °· Contractions may go away when you walk around or change positions while lying down. °· Contractions get weaker and are shorter-lasting as time goes on. °· The cervix usually does not dilate or become thin. °Follow these instructions at home: °· Take over-the-counter and prescription medicines only as told by your health care provider. °· Keep up with your usual exercises and follow other instructions from your health care provider. °· Eat and drink lightly if you think you are going into labor. °· If Braxton Hicks contractions are making you uncomfortable: °? Change your position from lying down or resting to walking, or change from walking to resting. °? Sit and rest in a tub of warm water. °? Drink enough fluid to keep your urine pale yellow. Dehydration may cause these contractions. °? Do slow and deep breathing several times an hour. °· Keep all follow-up prenatal visits as told by your health care provider. This is important. °Contact a health care provider if: °· You have a fever. °· You have continuous pain in your abdomen. °Get help right away if: °· Your contractions become stronger, more regular, and closer together. °· You have fluid leaking or gushing from your vagina. °· You pass blood-tinged mucus (bloody show). °· You have bleeding from your vagina. °· You have low back pain that you never had before. °· You feel your baby’s head pushing down and causing pelvic pressure. °· Your baby is not moving inside you as much as it used to. °Summary °· Contractions that occur before labor are called Braxton   Hicks contractions, false labor, or practice contractions. °· Braxton Hicks contractions are usually shorter, weaker, farther apart, and less regular than true labor contractions. True labor contractions usually become progressively stronger and regular and they become more frequent. °· Manage discomfort from Braxton Hicks contractions by  changing position, resting in a warm bath, drinking plenty of water, or practicing deep breathing. °This information is not intended to replace advice given to you by your health care provider. Make sure you discuss any questions you have with your health care provider. °Document Released: 12/22/2016 Document Revised: 12/22/2016 Document Reviewed: 12/22/2016 °Elsevier Interactive Patient Education © 2018 [employer] ° °

## 2017-10-03 NOTE — Progress Notes (Signed)
Pt is here for an [place] visit. States baby has not moved too much today.

## 2017-10-05 ENCOUNTER — Encounter: Payer: Self-pay | Admitting: Certified Nurse Midwife

## 2017-10-10 ENCOUNTER — Encounter: Payer: Self-pay | Admitting: Certified Nurse Midwife

## 2017-10-10 ENCOUNTER — Ambulatory Visit (INDEPENDENT_AMBULATORY_CARE_PROVIDER_SITE_OTHER): Payer: No Typology Code available for payment source | Admitting: Certified Nurse Midwife

## 2017-10-10 VITALS — BP 127/73 | HR 85 | Wt 174.4 lb

## 2017-10-10 DIAGNOSIS — O26893 Other specified pregnancy related conditions, third trimester: Secondary | ICD-10-CM

## 2017-10-10 DIAGNOSIS — O1213 Gestational proteinuria, third trimester: Secondary | ICD-10-CM

## 2017-10-10 DIAGNOSIS — Z3483 Encounter for supervision of other normal pregnancy, third trimester: Secondary | ICD-10-CM | POA: Diagnosis not present

## 2017-10-10 DIAGNOSIS — R51 Headache: Secondary | ICD-10-CM

## 2017-10-10 LAB — COMPREHENSIVE METABOLIC PANEL
A/G RATIO: 1 — AB (ref 1.2–2.2)
ALBUMIN: 3.3 g/dL — AB (ref 3.5–5.5)
ALT: 16 IU/L (ref 0–32)
AST: 21 IU/L (ref 0–40)
Alkaline Phosphatase: 158 IU/L — ABNORMAL HIGH (ref 39–117)
BUN / CREAT RATIO: 11 (ref 9–23)
BUN: 7 mg/dL (ref 6–20)
CALCIUM: 8.6 mg/dL — AB (ref 8.7–10.2)
CO2: 21 mmol/L (ref 20–29)
Chloride: 102 mmol/L (ref 96–106)
Creatinine, Ser: 0.62 mg/dL (ref 0.57–1.00)
GFR, EST AFRICAN AMERICAN: 142 mL/min/{1.73_m2} (ref >59)
GFR, EST NON AFRICAN AMERICAN: 123 mL/min/{1.73_m2} (ref >59)
GLOBULIN, TOTAL: 3.2 g/dL (ref 1.5–4.5)
Glucose: 78 mg/dL (ref 65–99)
POTASSIUM: 3.9 mmol/L (ref 3.5–5.2)
SODIUM: 137 mmol/L (ref 134–144)
TOTAL PROTEIN: 6.5 g/dL (ref 6.0–8.5)

## 2017-10-10 LAB — POCT URINALYSIS DIPSTICK
BILIRUBIN UA: NEGATIVE
Blood, UA: NEGATIVE
Glucose, UA: NEGATIVE
Ketones, UA: NEGATIVE
LEUKOCYTES UA: NEGATIVE
Nitrite, UA: NEGATIVE
Spec Grav, UA: 1.02 (ref 1.010–1.025)
Urobilinogen, UA: 0.2 E.U./dL
pH, UA: 6.5 (ref 5.0–8.0)

## 2017-10-10 LAB — CBC
HEMATOCRIT: 34.6 % (ref 34.0–46.6)
Hemoglobin: 11.9 g/dL (ref 11.1–15.9)
MCH: 28.3 pg (ref 26.6–33.0)
MCHC: 34.4 g/dL (ref 31.5–35.7)
MCV: 82 fL (ref 79–97)
Platelets: 248 10*3/uL (ref 150–379)
RBC: 4.21 x10E6/uL (ref 3.77–5.28)
RDW: 14.5 % (ref 12.3–15.4)
WBC: 9.8 10*3/uL (ref 3.4–10.8)

## 2017-10-10 LAB — PROTEIN / CREATININE RATIO, URINE
Creatinine, Urine: 152.4 mg/dL
Protein, Ur: 43.8 mg/dL
Protein/Creat Ratio: 287 mg/g creat — ABNORMAL HIGH (ref 0–200)

## 2017-10-10 NOTE — Patient Instructions (Signed)
Fetal Movement Counts Patient Name: ________________________________________________ Patient Due Date: ____________________ What is a fetal movement count? A fetal movement count is the number of times that you feel your baby move during a certain amount of time. This may also be called a fetal kick count. A fetal movement count is recommended for every pregnant woman. You may be asked to start counting fetal movements as early as week 28 of your pregnancy. Pay attention to when your baby is most active. You may notice your baby's sleep and wake cycles. You may also notice things that make your baby move more. You should do a fetal movement count:  When your baby is normally most active.  At the same time each day.  A good time to count movements is while you are resting, after having something to eat and drink. How do I count fetal movements? 1. Find a quiet, comfortable area. Sit, or lie down on your side. 2. Write down the date, the start time and stop time, and the number of movements that you felt between those two times. Take this information with you to your health care visits. 3. For 2 hours, count kicks, flutters, swishes, rolls, and jabs. You should feel at least 10 movements during 2 hours. 4. You may stop counting after you have felt 10 movements. 5. If you do not feel 10 movements in 2 hours, have something to eat and drink. Then, keep resting and counting for 1 hour. If you feel at least 4 movements during that hour, you may stop counting. Contact a health care provider if:  You feel fewer than 4 movements in 2 hours.  Your baby is not moving like he or she usually does. Date: ____________ Start time: ____________ Stop time: ____________ Movements: ____________ Date: ____________ Start time: ____________ Stop time: ____________ Movements: ____________ Date: ____________ Start time: ____________ Stop time: ____________ Movements: ____________ Date: ____________ Start time:  ____________ Stop time: ____________ Movements: ____________ Date: ____________ Start time: ____________ Stop time: ____________ Movements: ____________ Date: ____________ Start time: ____________ Stop time: ____________ Movements: ____________ Date: ____________ Start time: ____________ Stop time: ____________ Movements: ____________ Date: ____________ Start time: ____________ Stop time: ____________ Movements: ____________ Date: ____________ Start time: ____________ Stop time: ____________ Movements: ____________ This information is not intended to replace advice given to you by your health care provider. Make sure you discuss any questions you have with your health care provider. Document Released: 09/07/2006 Document Revised: 04/06/2016 Document Reviewed: 09/17/2015 Elsevier Interactive Patient Education  2018 Elsevier [employer]. Preeclampsia and Eclampsia Preeclampsia is a serious condition that develops only during pregnancy. It is also called toxemia of pregnancy. This condition causes high blood pressure along with other symptoms, such as swelling and headaches. These symptoms may develop as the condition gets worse. Preeclampsia may occur at 20 weeks of pregnancy or later. Diagnosing and treating preeclampsia early is very important. If not treated early, it can cause serious problems for you and your baby. One problem it can lead to is eclampsia, which is a condition that causes muscle jerking or shaking (convulsions or seizures) in the mother. Delivering your baby is the best treatment for preeclampsia or eclampsia. Preeclampsia and eclampsia symptoms usually go away after your baby is born. What are the causes? The cause of preeclampsia is not known. What increases the risk? The following risk factors make you more likely to develop preeclampsia:  Being pregnant for the first time.  Having had preeclampsia during a past pregnancy.  Having a family history   of preeclampsia.  Having high  blood pressure.  Being pregnant with twins or triplets.  Being 35 or older.  Being African-American.  Having kidney disease or diabetes.  Having medical conditions such as lupus or blood diseases.  Being very overweight (obese).  What are the signs or symptoms? The earliest signs of preeclampsia are:  High blood pressure.  Increased protein in your urine. Your health care provider will check for this at every visit before you give birth (prenatal visit).  Other symptoms that may develop as the condition gets worse include:  Severe headaches.  Sudden weight gain.  Swelling of the hands, face, legs, and feet.  Nausea and vomiting.  Vision problems, such as blurred or double vision.  Numbness in the face, arms, legs, and feet.  Urinating less than usual.  Dizziness.  Slurred speech.  Abdominal pain, especially upper abdominal pain.  Convulsions or seizures.  Symptoms generally go away after giving birth. How is this diagnosed? There are no screening tests for preeclampsia. Your health care provider will ask you about symptoms and check for signs of preeclampsia during your prenatal visits. You may also have tests that include:  Urine tests.  Blood tests.  Checking your blood pressure.  Monitoring your baby's heart rate.  Ultrasound.  How is this treated? You and your health care provider will determine the treatment approach that is best for you. Treatment may include:  Having more frequent prenatal exams to check for signs of preeclampsia, if you have an increased risk for preeclampsia.  Bed rest.  Reducing how much salt (sodium) you eat.  Medicine to lower your blood pressure.  Staying in the hospital, if your condition is severe. There, treatment will focus on controlling your blood pressure and the amount of fluids in your body (fluid retention).  You may need to take medicine (magnesium sulfate) to prevent seizures. This medicine may be given  as an injection or through an IV tube.  Delivering your baby early, if your condition gets worse. You may have your labor started with medicine (induced), or you may have a cesarean delivery.  Follow these instructions at home: Eating and drinking   Drink enough fluid to keep your urine clear or pale yellow.  Eat a healthy diet that is low in sodium. Do not add salt to your food. Check nutrition labels to see how much sodium a food or beverage contains.  Avoid caffeine. Lifestyle  Do not use any products that contain nicotine or tobacco, such as cigarettes and e-cigarettes. If you need help quitting, ask your health care provider.  Do not use alcohol or drugs.  Avoid stress as much as possible. Rest and get plenty of sleep. General instructions  Take over-the-counter and prescription medicines only as told by your health care provider.  When lying down, lie on your side. This keeps pressure off of your baby.  When sitting or lying down, raise (elevate) your feet. Try putting some pillows underneath your lower legs.  Exercise regularly. Ask your health care provider what kinds of exercise are best for you.  Keep all follow-up and prenatal visits as told by your health care provider. This is important. How is this prevented? To prevent preeclampsia or eclampsia from developing during another pregnancy:  Get proper medical care during pregnancy. Your health care provider may be able to prevent preeclampsia or diagnose and treat it early.  Your health care provider may have you take a low-dose aspirin or a calcium supplement during   your next pregnancy.  You may have tests of your blood pressure and kidney function after giving birth.  Maintain a healthy weight. Ask your health care provider for help managing weight gain during pregnancy.  Work with your health care provider to manage any long-term (chronic) health conditions you have, such as diabetes or kidney  problems.  Contact a health care provider if:  You gain more weight than expected.  You have headaches.  You have nausea or vomiting.  You have abdominal pain.  You feel dizzy or light-headed. Get help right away if:  You develop sudden or severe swelling anywhere in your body. This usually happens in the legs.  You gain 5 lbs (2.3 kg) or more during one week.  You have severe: ? Abdominal pain. ? Headaches. ? Dizziness. ? Vision problems. ? Confusion. ? Nausea or vomiting.  You have a seizure.  You have trouble moving any part of your body.  You develop numbness in any part of your body.  You have trouble speaking.  You have any abnormal bleeding.  You pass out. This information is not intended to replace advice given to you by your health care provider. Make sure you discuss any questions you have with your health care provider. Document Released: 08/05/2000 Document Revised: 04/05/2016 Document Reviewed: 03/14/2016 Elsevier Interactive Patient Education  2018 Elsevier Inc.  

## 2017-10-10 NOTE — Progress Notes (Signed)
Pt is here for an ROB visit. 

## 2017-10-10 NOTE — Progress Notes (Signed)
ROB-Reports headache this morning, increased pelvic pressure, and bilateral hip pain with movement. Denies blurred vision or epigastric pain. Tylenol given in office. CBC, CMP, and urine PC ratio STAT, see orders. Will contact patient with results. Discussed home treatment measures for discomforts of pregnancy.SVE unchanged from last week. Reviewed red flag symptoms and when to call. RTC x 1 week for ROB or sooner if needed.

## 2017-10-11 ENCOUNTER — Other Ambulatory Visit: Payer: Self-pay | Admitting: Certified Nurse Midwife

## 2017-10-11 DIAGNOSIS — O133 Gestational [pregnancy-induced] hypertension without significant proteinuria, third trimester: Secondary | ICD-10-CM

## 2017-10-11 DIAGNOSIS — Z8759 Personal history of other complications of pregnancy, childbirth and the puerperium: Secondary | ICD-10-CM | POA: Insufficient documentation

## 2017-10-12 ENCOUNTER — Encounter: Payer: Self-pay | Admitting: Certified Nurse Midwife

## 2017-10-12 LAB — PROTEIN / CREATININE RATIO, URINE

## 2017-10-14 ENCOUNTER — Inpatient Hospital Stay: Payer: Medicaid Other | Admitting: Anesthesiology

## 2017-10-14 ENCOUNTER — Inpatient Hospital Stay
Admission: EM | Admit: 2017-10-14 | Discharge: 2017-10-16 | DRG: 807 | Disposition: A | Discharge status: Home / Self Care | Payer: Medicaid Other | Attending: Certified Nurse Midwife | Admitting: Certified Nurse Midwife

## 2017-10-14 ENCOUNTER — Other Ambulatory Visit: Payer: Self-pay

## 2017-10-14 DIAGNOSIS — Z3A39 39 weeks gestation of pregnancy: Secondary | ICD-10-CM | POA: Diagnosis not present

## 2017-10-14 DIAGNOSIS — O134 Gestational [pregnancy-induced] hypertension without significant proteinuria, complicating childbirth: Secondary | ICD-10-CM | POA: Diagnosis not present

## 2017-10-14 DIAGNOSIS — Z6791 Unspecified blood type, Rh negative: Secondary | ICD-10-CM | POA: Diagnosis not present

## 2017-10-14 DIAGNOSIS — O9081 Anemia of the puerperium: Secondary | ICD-10-CM | POA: Diagnosis not present

## 2017-10-14 DIAGNOSIS — O133 Gestational [pregnancy-induced] hypertension without significant proteinuria, third trimester: Secondary | ICD-10-CM

## 2017-10-14 DIAGNOSIS — O99824 Streptococcus B carrier state complicating childbirth: Secondary | ICD-10-CM | POA: Diagnosis present

## 2017-10-14 DIAGNOSIS — Z3490 Encounter for supervision of normal pregnancy, unspecified, unspecified trimester: Secondary | ICD-10-CM | POA: Diagnosis present

## 2017-10-14 DIAGNOSIS — O26893 Other specified pregnancy related conditions, third trimester: Secondary | ICD-10-CM | POA: Diagnosis present

## 2017-10-14 DIAGNOSIS — Z87891 Personal history of nicotine dependence: Secondary | ICD-10-CM

## 2017-10-14 LAB — COMPREHENSIVE METABOLIC PANEL
ALBUMIN: 3.2 g/dL — AB (ref 3.5–5.0)
ALK PHOS: 165 U/L — AB (ref 38–126)
ALT: 15 U/L (ref 14–54)
ANION GAP: 11 (ref 5–15)
AST: 31 U/L (ref 15–41)
BILIRUBIN TOTAL: 0.5 mg/dL (ref 0.3–1.2)
BUN: 13 mg/dL (ref 6–20)
CALCIUM: 10.5 mg/dL — AB (ref 8.9–10.3)
CO2: 20 mmol/L — AB (ref 22–32)
Chloride: 106 mmol/L (ref 101–111)
Creatinine, Ser: 0.76 mg/dL (ref 0.44–1.00)
GFR calc Af Amer: >60 mL/min (ref >60)
GFR calc non Af Amer: >60 mL/min (ref >60)
GLUCOSE: 73 mg/dL (ref 65–99)
POTASSIUM: 3.7 mmol/L (ref 3.5–5.1)
Sodium: 137 mmol/L (ref 135–145)
TOTAL PROTEIN: 7.2 g/dL (ref 6.5–8.1)

## 2017-10-14 LAB — CBC
HEMATOCRIT: 37.5 % (ref 35.0–47.0)
HEMOGLOBIN: 12.5 g/dL (ref 12.0–16.0)
MCH: 27.6 pg (ref 26.0–34.0)
MCHC: 33.4 g/dL (ref 32.0–36.0)
MCV: 82.8 fL (ref 80.0–100.0)
Platelets: 255 10*3/uL (ref 150–440)
RBC: 4.53 MIL/uL (ref 3.80–5.20)
RDW: 14.4 % (ref 11.5–14.5)
WBC: 11 10*3/uL (ref 3.6–11.0)

## 2017-10-14 LAB — TYPE AND SCREEN
ABO/RH(D): O NEG
Antibody Screen: POSITIVE

## 2017-10-14 MED ORDER — COCONUT OIL OIL
1.0000 "application " | TOPICAL_OIL | Status: DC | PRN
  Filled 2017-10-14: qty 120

## 2017-10-14 MED ORDER — WITCH HAZEL-GLYCERIN EX PADS: 1.0000 "application " | MEDICATED_PAD | CUTANEOUS | Status: DC | PRN

## 2017-10-14 MED ORDER — PHENYLEPHRINE 40 MCG/ML (10ML) SYRINGE FOR IV PUSH (FOR BLOOD PRESSURE SUPPORT)
80.0000 ug | PREFILLED_SYRINGE | INTRAVENOUS | Status: DC | PRN
  Filled 2017-10-14: qty 5

## 2017-10-14 MED ORDER — DIPHENHYDRAMINE HCL 50 MG/ML IJ SOLN: 12.5000 mg | INTRAMUSCULAR | Status: DC | PRN

## 2017-10-14 MED ORDER — PRENATAL MULTIVITAMIN CH
1.0000 | ORAL_TABLET | Freq: Every day | ORAL | Status: DC
  Administered 2017-10-15 – 2017-10-16 (×2): 1 via ORAL
  Filled 2017-10-14 (×2): qty 1

## 2017-10-14 MED ORDER — BENZOCAINE-MENTHOL 20-0.5 % EX AERO: 1.0000 "application " | INHALATION_SPRAY | CUTANEOUS | Status: DC | PRN

## 2017-10-14 MED ORDER — SOD CITRATE-CITRIC ACID 500-334 MG/5ML PO SOLN: 30.0000 mL | ORAL | Status: DC | PRN

## 2017-10-14 MED ORDER — EPHEDRINE 5 MG/ML INJ
10.0000 mg | INTRAVENOUS | Status: DC | PRN
Start: 2017-10-14 — End: 2017-10-14
  Filled 2017-10-14: qty 2

## 2017-10-14 MED ORDER — ZOLPIDEM TARTRATE 5 MG PO TABS: 5.0000 mg | ORAL_TABLET | Freq: Every evening | ORAL | Status: DC | PRN

## 2017-10-14 MED ORDER — LIDOCAINE HCL (PF) 1 % IJ SOLN
INTRAMUSCULAR | Status: AC
  Filled 2017-10-14: qty 30

## 2017-10-14 MED ORDER — ONDANSETRON HCL 4 MG/2ML IJ SOLN: 4.0000 mg | INTRAMUSCULAR | Status: DC | PRN

## 2017-10-14 MED ORDER — OXYCODONE-ACETAMINOPHEN 5-325 MG PO TABS: 2.0000 | ORAL_TABLET | ORAL | Status: DC | PRN

## 2017-10-14 MED ORDER — DIPHENHYDRAMINE HCL 25 MG PO CAPS: 25.0000 mg | ORAL_CAPSULE | Freq: Four times a day (QID) | ORAL | Status: DC | PRN

## 2017-10-14 MED ORDER — EPHEDRINE 5 MG/ML INJ
10.0000 mg | INTRAVENOUS | Status: DC | PRN
  Filled 2017-10-14: qty 2

## 2017-10-14 MED ORDER — SIMETHICONE 80 MG PO CHEW: 80.0000 mg | CHEWABLE_TABLET | ORAL | Status: DC | PRN

## 2017-10-14 MED ORDER — DIBUCAINE 1 % RE OINT: 1.0000 "application " | TOPICAL_OINTMENT | RECTAL | Status: DC | PRN

## 2017-10-14 MED ORDER — SODIUM CHLORIDE FLUSH 0.9 % IV SOLN
INTRAVENOUS | Status: AC
  Filled 2017-10-14: qty 20

## 2017-10-14 MED ORDER — IBUPROFEN 600 MG PO TABS
600.0000 mg | ORAL_TABLET | Freq: Four times a day (QID) | ORAL | Status: DC
  Administered 2017-10-14 – 2017-10-16 (×8): 600 mg via ORAL
  Filled 2017-10-14 (×8): qty 1

## 2017-10-14 MED ORDER — AMMONIA AROMATIC IN INHA
RESPIRATORY_TRACT | Status: AC
  Filled 2017-10-14: qty 10

## 2017-10-14 MED ORDER — BUTORPHANOL TARTRATE 1 MG/ML IJ SOLN
1.0000 mg | INTRAMUSCULAR | Status: DC | PRN
  Administered 2017-10-14: 1 mg via INTRAVENOUS
  Filled 2017-10-14: qty 1

## 2017-10-14 MED ORDER — LACTATED RINGERS IV SOLN
INTRAVENOUS | Status: DC
  Administered 2017-10-14: 06:00:00 via INTRAVENOUS

## 2017-10-14 MED ORDER — SODIUM CHLORIDE 0.9 % IV SOLN
5.0000 10*6.[IU] | Freq: Once | INTRAVENOUS | Status: AC
  Administered 2017-10-14: 5 10*6.[IU] via INTRAVENOUS
  Filled 2017-10-14: qty 5

## 2017-10-14 MED ORDER — ONDANSETRON HCL 4 MG/2ML IJ SOLN: 4.0000 mg | Freq: Four times a day (QID) | INTRAMUSCULAR | Status: DC | PRN

## 2017-10-14 MED ORDER — ONDANSETRON HCL 4 MG PO TABS: 4.0000 mg | ORAL_TABLET | ORAL | Status: DC | PRN

## 2017-10-14 MED ORDER — LACTATED RINGERS IV SOLN
500.0000 mL | INTRAVENOUS | Status: DC | PRN
  Administered 2017-10-14: 500 mL via INTRAVENOUS

## 2017-10-14 MED ORDER — FENTANYL 2.5 MCG/ML W/ROPIVACAINE 0.15% IN NS 100 ML EPIDURAL (ARMC)
12.0000 mL/h | EPIDURAL | Status: DC
  Administered 2017-10-14: 12 mL/h via EPIDURAL

## 2017-10-14 MED ORDER — OXYTOCIN BOLUS FROM INFUSION
500.0000 mL | Freq: Once | INTRAVENOUS | Status: AC
  Administered 2017-10-14: 500 mL via INTRAVENOUS

## 2017-10-14 MED ORDER — ACETAMINOPHEN 325 MG PO TABS
650.0000 mg | ORAL_TABLET | ORAL | Status: DC | PRN
  Administered 2017-10-14 – 2017-10-15 (×3): 650 mg via ORAL
  Filled 2017-10-14 (×2): qty 2

## 2017-10-14 MED ORDER — FENTANYL 2.5 MCG/ML W/ROPIVACAINE 0.15% IN NS 100 ML EPIDURAL (ARMC)
EPIDURAL | Status: AC
  Filled 2017-10-14: qty 100

## 2017-10-14 MED ORDER — TERBUTALINE SULFATE 1 MG/ML IJ SOLN: 0.2500 mg | Freq: Once | INTRAMUSCULAR | Status: DC | PRN

## 2017-10-14 MED ORDER — LACTATED RINGERS IV SOLN: 500.0000 mL | Freq: Once | INTRAVENOUS | Status: DC

## 2017-10-14 MED ORDER — PENICILLIN G POT IN DEXTROSE 60000 UNIT/ML IV SOLN
3.0000 10*6.[IU] | INTRAVENOUS | Status: DC
  Filled 2017-10-14 (×5): qty 50

## 2017-10-14 MED ORDER — OXYTOCIN 40 UNITS IN LACTATED RINGERS INFUSION - SIMPLE MED: 1.0000 m[IU]/min | INTRAVENOUS | Status: DC

## 2017-10-14 MED ORDER — OXYTOCIN 40 UNITS IN LACTATED RINGERS INFUSION - SIMPLE MED
2.5000 [IU]/h | INTRAVENOUS | Status: DC
  Filled 2017-10-14: qty 1000

## 2017-10-14 MED ORDER — MISOPROSTOL 25 MCG QUARTER TABLET: 50.0000 ug | ORAL_TABLET | ORAL | Status: DC | PRN

## 2017-10-14 MED ORDER — FLEET ENEMA 7-19 GM/118ML RE ENEM: 1.0000 | ENEMA | Freq: Every day | RECTAL | Status: DC | PRN

## 2017-10-14 MED ORDER — BUPIVACAINE HCL (PF) 0.25 % IJ SOLN
INTRAMUSCULAR | Status: DC | PRN
  Administered 2017-10-14: 5 mL/h via EPIDURAL

## 2017-10-14 MED ORDER — ACETAMINOPHEN 325 MG PO TABS
650.0000 mg | ORAL_TABLET | ORAL | Status: DC | PRN
  Filled 2017-10-14: qty 2

## 2017-10-14 MED ORDER — LIDOCAINE HCL (PF) 1 % IJ SOLN: 30.0000 mL | INTRAMUSCULAR | Status: DC | PRN

## 2017-10-14 MED ORDER — OXYTOCIN 10 UNIT/ML IJ SOLN
INTRAMUSCULAR | Status: AC
  Filled 2017-10-14: qty 2

## 2017-10-14 MED ORDER — MISOPROSTOL 200 MCG PO TABS
ORAL_TABLET | ORAL | Status: AC
  Filled 2017-10-14: qty 4

## 2017-10-14 MED ORDER — OXYCODONE-ACETAMINOPHEN 5-325 MG PO TABS: 1.0000 | ORAL_TABLET | ORAL | Status: DC | PRN

## 2017-10-14 MED ORDER — SENNOSIDES-DOCUSATE SODIUM 8.6-50 MG PO TABS
2.0000 | ORAL_TABLET | ORAL | Status: DC
  Administered 2017-10-15 (×2): 2 via ORAL
  Filled 2017-10-14 (×2): qty 2

## 2017-10-14 NOTE — Care Management (Signed)
For some reason the chart shows a continuous infusion of marcaine. This was not given. Only a bolus dose of 5 ml of 0.25 narcaine was given at 0732. Please ignore that infusion. Unable to cancel.

## 2017-10-14 NOTE — H&P (Signed)
Obstetric History and Physical  Tina Hughes is a 27 y.o. G2P1001 with IUP at [redacted]w[redacted]d presenting with leakage of fluid and uterine contractions. Patient states she has been having  regular contractions, minimal vaginal bleeding, clear fluid membranes, with active fetal movement.    Denies difficulty breathing or respiratory distress, chest pain, and leg pain or swelling.   Prenatal Course  Source of Care: [facility]-initial visit at 11 wks, total visits: 12  Pregnancy complications or risks: Rh negative, Gestational hypertension  Prenatal labs and studies:  ABO, Rh: --/--/O NEG (02/23 0622)  Antibody: POS (02/23 0622)  Rubella: 4.28 (07/16 1514)  Varicella: 1,759 (07/16 1514)  RPR: Non Reactive (12/12 0903)   HBsAg: Negative (07/16 1514)   HIV: Non Reactive (07/16 1514)   OJJ:[id number] (01/29 1202)  1 hr Glucola: 89 (12/12 0903)  Genetic screening: Low risk female (08/10 1410)  Anatomy US: Normal, complete (10/26 9937)  Past Medical History:  Diagnosis Date  . Anxiety   . Vegetarian     History reviewed. No pertinent surgical history.  OB History  Gravida Para Term Preterm AB Living  2 1 1     1   SAB TAB Ectopic Multiple Live Births          1    # Outcome Date GA Lbr Len/2nd Weight Sex Delivery Anes PTL Lv  2 Current           1 Term 11/09/14    M Vag-Spont   LIV      Social History   Socioeconomic History  . Marital status: Married    Spouse name: None  . Number of children: None  . Years of education: None  . Highest education level: None  Social Needs  . Financial resource strain: None  . Food insecurity - worry: None  . Food insecurity - inability: None  . Transportation needs - medical: None  . Transportation needs - non-medical: None  Occupational History  . None  Tobacco Use  . Smoking status: Former Research scientist (life sciences)  . Smokeless tobacco: Never Used  Substance and Sexual Activity  . Alcohol use: No    Comment: occasional  . Drug use: No  .  Sexual activity: Yes    Birth control/protection: None  Other Topics Concern  . None  Social History Narrative  . None    Family History  Problem Relation Age of Onset  . Cancer Paternal Grandmother        skin cancer    Medications Prior to Admission  Medication Sig Dispense Refill Last Dose  . Prenatal Vit-Fe Fumarate-FA (PRENATAL MULTIVITAMIN) TABS tablet Take 1 tablet by mouth daily at 12 noon.   10/13/2017 at Unknown time    No Known Allergies  Review of Systems: Negative except for what is mentioned in HPI.  Physical Exam:  Temp:  [98.4 F (36.9 C)] 98.4 F (36.9 C) (02/23 0619) Pulse Rate:  [79-98] 91 (02/23 0820) Resp:  [16] 16 (02/23 0619) BP: (136-150)/(83-110) 148/86 (02/23 0820) SpO2:  [91 %-100 %] 98 % (02/23 0825) Weight:  [174 lb (78.9 kg)] 174 lb (78.9 kg) (02/23 1696)  GENERAL: Well-developed, well-nourished female in no acute distress.   LUNGS: Clear to auscultation bilaterally.   HEART: Regular rate and rhythm.  ABDOMEN: Soft, nontender, nondistended, gravid.  EXTREMITIES: Nontender, no edema, 2+ distal pulses.  Cervical Exam: Dilation: 10 Dilation Complete Date: 10/14/17 Dilation Complete Time: 0814 Effacement (%): 100 Station: +1 Presentation: Vertex Exam by:: S.Doldron RN  FHT:  Baseline rate 135 bpm   Variability moderate  Accelerations present   Decelerations none  Contractions: Every two (2) to three (3) minutes, soft resting tone   Pertinent Labs/Studies:    Results for orders placed or performed during the hospital encounter of 10/14/17 (from the past 24 hour(s))  CBC     Status: None   Collection Time: 10/14/17  6:22 AM  Result Value Ref Range   WBC 11.0 3.6 - 11.0 K/uL   RBC 4.53 3.80 - 5.20 MIL/uL   Hemoglobin 12.5 12.0 - 16.0 g/dL   HCT 37.5 35.0 - 47.0 %   MCV 82.8 80.0 - 100.0 fL   MCH 27.6 26.0 - 34.0 pg   MCHC 33.4 32.0 - 36.0 g/dL   RDW 14.4 11.5 - 14.5 %   Platelets 255 150 - 440 K/uL  Comprehensive metabolic  panel     Status: Abnormal   Collection Time: 10/14/17  6:22 AM  Result Value Ref Range   Sodium 137 135 - 145 mmol/L   Potassium 3.7 3.5 - 5.1 mmol/L   Chloride 106 101 - 111 mmol/L   CO2 20 (L) 22 - 32 mmol/L   Glucose, Bld 73 65 - 99 mg/dL   BUN 13 6 - 20 mg/dL   Creatinine, Ser 0.76 0.44 - 1.00 mg/dL   Calcium 10.5 (H) 8.9 - 10.3 mg/dL   Total Protein 7.2 6.5 - 8.1 g/dL   Albumin 3.2 (L) 3.5 - 5.0 g/dL   AST 31 15 - 41 U/L   ALT 15 14 - 54 U/L   Alkaline Phosphatase 165 (H) 38 - 126 U/L   Total Bilirubin 0.5 0.3 - 1.2 mg/dL   GFR calc non Af Amer >60 >60 mL/min   GFR calc Af Amer >60 >60 mL/min   Anion gap 11 5 - 15  Type and screen     Status: None   Collection Time: 10/14/17  6:22 AM  Result Value Ref Range   ABO/RH(D) O NEG    Antibody Screen POS    Sample Expiration 10/17/2017    Antibody Identification      PASSIVELY ACQUIRED ANTI-D Performed at [facility], [address] [place], [place] [address]     Assessment :  Tina Hughes is a 27 y.o. G2P1001 at [redacted]w[redacted]d being admitted for labor, Rh negative, GBS positive, epidural  FHR Category I  Plan:  Admit to birthing suites, see orders.   GBS prophylaxis with IV penicillin, one (1) dose given.   Delivery plan: Hopeful for vaginal delivery.   Dr. Marcelline Mates notified of admission and plan of care.    Diona Fanti, CNM [facility], [facility]

## 2017-10-14 NOTE — Plan of Care (Signed)
Pt's written birth plan as written on her cellphone:  Please note that I: Have a group B strep Am Rh incompatible with baby but have had the shot at 28 weeks.  I would like to have my partner, mother, and mother in law present before and during labor.  As the baby is delivered, I would like to : Let my partner catch the baby  Immediately after delivery, I would like:  The umbilical cord to be cut by my partner ONLY after it stops pulsating To donate the cord blood  I would like to hold baby: Immediately after delivery

## 2017-10-14 NOTE — Anesthesia Preprocedure Evaluation (Signed)
Anesthesia Evaluation  Patient identified by MRN, date of birth, ID band Patient awake    Reviewed: Allergy & Precautions, NPO status , Patient's Chart, lab work & pertinent test results, reviewed documented beta blocker date and time   Airway Mallampati: III  TM Distance: >3 FB     Dental  (+) Chipped   Pulmonary former smoker,           Cardiovascular hypertension,      Neuro/Psych Anxiety    GI/Hepatic   Endo/Other    Renal/GU      Musculoskeletal   Abdominal   Peds  Hematology   Anesthesia Other Findings   Reproductive/Obstetrics                             Anesthesia Physical Anesthesia Plan  ASA: II  Anesthesia Plan: Epidural   Post-op Pain Management:    Induction:   PONV Risk Score and Plan:   Airway Management Planned:   Additional Equipment:   Intra-op Plan:   Post-operative Plan:   Informed Consent: I have reviewed the patients History and Physical, chart, labs and discussed the procedure including the risks, benefits and alternatives for the proposed anesthesia with the patient or authorized representative who has indicated his/her understanding and acceptance.     Plan Discussed with: CRNA  Anesthesia Plan Comments:         Anesthesia Quick Evaluation

## 2017-10-14 NOTE — Anesthesia Procedure Notes (Signed)
Epidural Patient location during procedure: OB  Staffing Anesthesiologist: Ranveer Wahlstrom, MD Performed: anesthesiologist   Preanesthetic Checklist Completed: patient identified, site marked, surgical consent, pre-op evaluation, timeout performed, IV checked, risks and benefits discussed and monitors and equipment checked  Epidural Patient position: sitting Prep: ChloraPrep Patient monitoring: heart rate, continuous pulse ox and blood pressure Approach: midline Location: L4-L5 Injection technique: LOR saline  Needle:  Needle type: Tuohy  Needle gauge: 18 G Needle length: 9 cm and 9 Catheter type: closed end flexible Catheter size: 20 Guage Test dose: negative and 1.5% lidocaine with Epi 1:200 K  Assessment Sensory level: T10 Events: blood not aspirated, injection not painful, no injection resistance, negative IV test and no paresthesia  Additional Notes   Patient tolerated the insertion well without complications.Reason for block:procedure for pain     

## 2017-10-15 DIAGNOSIS — O9081 Anemia of the puerperium: Secondary | ICD-10-CM | POA: Diagnosis present

## 2017-10-15 LAB — CBC
HEMATOCRIT: 29.3 % — AB (ref 35.0–47.0)
HEMOGLOBIN: 9.9 g/dL — AB (ref 12.0–16.0)
MCH: 28.6 pg (ref 26.0–34.0)
MCHC: 34 g/dL (ref 32.0–36.0)
MCV: 84.1 fL (ref 80.0–100.0)
PLATELETS: 191 10*3/uL (ref 150–440)
RBC: 3.48 MIL/uL — AB (ref 3.80–5.20)
RDW: 14.9 % — ABNORMAL HIGH (ref 11.5–14.5)
WBC: 10.2 10*3/uL (ref 3.6–11.0)

## 2017-10-15 LAB — FETAL SCREEN: Fetal Screen: NEGATIVE

## 2017-10-15 MED ORDER — RHO D IMMUNE GLOBULIN 1500 UNIT/2ML IJ SOSY
300.0000 ug | PREFILLED_SYRINGE | Freq: Once | INTRAMUSCULAR | Status: AC
Start: 2017-10-15 — End: 2017-10-15
  Administered 2017-10-15: 300 ug via INTRAVENOUS
  Filled 2017-10-15: qty 2

## 2017-10-15 MED ORDER — FERROUS SULFATE 325 (65 FE) MG PO TABS
325.0000 mg | ORAL_TABLET | Freq: Three times a day (TID) | ORAL | Status: DC
  Administered 2017-10-15 – 2017-10-16 (×4): 325 mg via ORAL
  Filled 2017-10-15 (×4): qty 1

## 2017-10-15 NOTE — Progress Notes (Signed)
Patient ID: Tina Hughes, female   DOB: [birth year], 27 y.o.   MRN: [id number]  Post Partum Day # 1, s/p SVD  Subjective:  Pt sitting in bed, holding infant. FOB at bedside. No questions or concerns.   Denies difficulty breathing or respiratory distress, chest pain, abdominal pain, excessive vaginal bleeding, dysuria, and leg pain or swelling.   Objective:  Temp:  [98 F (36.7 C)-98.8 F (37.1 C)] 98.5 F (36.9 C) (02/24 0850) Pulse Rate:  [65-86] 78 (02/24 0850) Resp:  [16-18] 18 (02/24 0850) BP: (110-132)/(68-85) 118/72 (02/24 0850) SpO2:  [98 %-100 %] 98 % (02/24 0850)  Physical Exam:   General: alert and cooperative   Lungs: clear to auscultation bilaterally  Breasts: normal appearance, no masses or tenderness  Heart: regular rate and rhythm, S1, S2 normal, no murmur, click, rub or gallop  Abdomen: soft, non-tender; bowel sounds normal; no masses,  no organomegaly Pelvis: Lochia: appropriate, Uterine  Fundus: firm  Extremities: DVT Evaluation: Negative Homan's sign.  Recent Labs    10/14/17 0622 10/15/17 0505  HGB 12.5 9.9*  HCT 37.5 29.3*    Assessment/Plan: Plan for discharge tomorrow, Breastfeeding and Lactation consult   LOS: 1 day    Diona Fanti, CNM [facility] 10/15/2017 8:58 AM

## 2017-10-15 NOTE — Anesthesia Postprocedure Evaluation (Signed)
Anesthesia Post Note  Patient: Tina Hughes  Procedure(s) Performed: AN AD [place]  Patient location during evaluation: Mother Baby Anesthesia Type: Epidural Level of consciousness: awake and alert Pain management: pain level controlled Vital Signs Assessment: post-procedure vital signs reviewed and stable Respiratory status: spontaneous breathing, nonlabored ventilation and respiratory function stable Cardiovascular status: stable Postop Assessment: no headache, no backache and epidural receding Anesthetic complications: no     Last Vitals:  Vitals:   10/15/17 0431 10/15/17 0850  BP: 110/77 118/72  Pulse: 70 78  Resp: 16 18  Temp: 36.8 C 36.9 C  SpO2: 98% 98%    Last Pain:  Vitals:   10/15/17 1201  TempSrc:   PainSc: [place]

## 2017-10-16 LAB — RHOGAM INJECTION: Unit division: 0

## 2017-10-16 LAB — RPR: RPR Ser Ql: NONREACTIVE

## 2017-10-16 MED ORDER — FERROUS SULFATE 325 (65 FE) MG PO TABS: 325.0000 mg | ORAL_TABLET | Freq: Three times a day (TID) | ORAL | 3 refills | Status: DC

## 2017-10-16 MED ORDER — IBUPROFEN 600 MG PO TABS: 600.0000 mg | ORAL_TABLET | Freq: Four times a day (QID) | ORAL | 0 refills | Status: DC

## 2017-10-16 NOTE — Discharge Summary (Signed)
  Obstetric Discharge Summary  Patient ID: Tina Hughes MRN: [id number] DOB/AGE: 1990-12-27 27 y.o.   Date of Admission: 10/14/2017 Dani Gobble, CNM Dellia Nims, MD)  Date of Discharge: 10/16/2017 Dani Gobble, CNM Dellia Nims, MD)  Admitting Diagnosis: Onset of Labor at [redacted]w[redacted]d  Secondary Diagnosis: Gestational hypertension  Mode of Delivery: normal spontaneous vaginal delivery     Discharge Diagnosis: Postpartum anemia   Intrapartum Procedures: epidural and GBS prophylaxis   Post partum procedures: rhogam  Complications: Bilateral labial lacerations   Brief Hospital Course  Tina Hughes is a [id number] who had a SVD on 10/14/2017;  for further details of this birth, please refer to the delivey note.  Patient had an uncomplicated postpartum course.  By time of discharge on PPD#2, her pain was controlled on oral pain medications; she had appropriate lochia and was ambulating, voiding without difficulty and tolerating regular diet.  She was deemed stable for discharge to home.    Labs:  CBC Latest Ref Rng & Units 10/15/2017 10/14/2017 10/10/2017  WBC 3.6 - 11.0 K/uL 10.2 11.0 9.8  Hemoglobin 12.0 - 16.0 g/dL 9.9(L) 12.5 11.9  Hematocrit 35.0 - 47.0 % 29.3(L) 37.5 34.6  Platelets 150 - 440 K/uL 191 255 248   O NEG  Physical exam:   Temp:  [97.8 F (36.6 C)-98.5 F (36.9 C)] 98 F (36.7 C) (02/25 0756) Pulse Rate:  [63-80] 65 (02/25 0756) Resp:  [18-20] 18 (02/25 0756) BP: (117-127)/(72-84) 124/77 (02/25 0756) SpO2:  [98 %-100 %] 100 % (02/25 0756)  General: alert and no distress  Lochia: appropriate  Abdomen: soft, NT  Uterine Fundus: firm  Extremities: No evidence of DVT seen on physical exam. No lower extremity edema.  Discharge Instructions: Per After Visit Summary.  Activity: Advance as tolerated. Pelvic rest for 6 weeks.  Also refer to After Visit Summary  Diet: Regular  Medications:  Allergies as of 10/16/2017   No Known Allergies      Medication List    TAKE these medications   ferrous sulfate 325 (65 FE) MG tablet Take 1 tablet (325 mg total) by mouth 3 (three) times daily with meals.   ibuprofen 600 MG tablet Commonly known as:  ADVIL,MOTRIN Take 1 tablet (600 mg total) by mouth every 6 (six) hours.   prenatal multivitamin Tabs tablet Take 1 tablet by mouth daily at 12 noon.      Outpatient follow up:  Follow-up Information    Diona Fanti, CNM. Call.   Specialties:  Certified Nurse Midwife, Obstetrics and Gynecology, Radiology Why:  Please call office to schedule six (6) weeks postpartum visit with [facility] Contact information: [address] [place] [address] [phone]          Postpartum contraception: Nexplanon  Discharged Condition: stable  Discharged to: home   Newborn Data:  Disposition:home with mother  Apgars: APGAR (1 MIN): 8   APGAR (5 MINS): 9     Baby Feeding: Breast   Diona Fanti, CNM [facility], [facility]

## 2017-10-16 NOTE — Progress Notes (Signed)
Discharge order received from doctor. Reviewed discharge instructions and prescriptions with patient and answered all questions. Follow up appointment instructions given. Patient verbalized understanding. ID bands checked. Patient discharged home with infant via wheelchair by nursing/auxillary.    Aprill Banko Garner, RN  

## 2017-10-16 NOTE — Discharge Instructions (Signed)
Please call your doctor or return to the ER if you experience any chest pains, shortness of breath, dizziness, visual changes, fever greater than 101, any heavy bleeding (saturating more than 1 pad per hour), large clots, or foul smelling discharge, any worsening abdominal pain and cramping that is not controlled by pain medication, or any signs of postpartum depression. No tampons, enemas, douches, or sexual intercourse for 6 weeks. Also avoid tub baths, hot tubs, or swimming for 6 weeks.             Home Care Instructions for Mom ACTIVITY  Gradually return to your regular activities.  Let yourself rest. Nap while your baby sleeps.  Avoid lifting anything that is heavier than 10 lb (4.5 kg) until your health care provider says it is okay.  Avoid activities that take a lot of effort and energy (are strenuous) until approved by your health care provider. Walking at a slow-to-moderate pace is usually safe.  If you had a cesarean delivery: ? Do not vacuum, climb stairs, or drive a car for 4-6 weeks. ? Have someone help you at home until you feel like you can do your usual activities yourself. ? Do exercises as told by your health care provider, if this applies.  VAGINAL BLEEDING You may continue to bleed for 4-6 weeks after delivery. Over time, the amount of blood usually decreases and the color of the blood usually gets lighter. However, the flow of bright red blood may increase if you have been too active. If you need to use more than one pad in an hour because your pad gets soaked, or if you pass a large clot:  Lie down.  Raise your feet.  Place a cold compress on your lower abdomen.  Rest.  Call your health care provider.  If you are breastfeeding, your period should return anytime between 8 weeks after delivery and the time that you stop breastfeeding. If you are not breastfeeding, your period should return 6-8 weeks after delivery. PERINEAL CARE The perineal area, or  perineum, is the part of your body between your thighs. After delivery, this area needs special care. Follow these instructions as told by your health care provider.  Take warm tub baths for 15-20 minutes.  Use medicated pads and pain-relieving sprays and creams as told.  Do not use tampons or douches until vaginal bleeding has stopped.  Each time you go to the bathroom: ? Use a peri bottle. ? Change your pad. ? Use towelettes in place of toilet paper until your stitches have healed.  Do Kegel exercises every day. Kegel exercises help to maintain the muscles that support the vagina, bladder, and bowels. You can do these exercises while you are standing, sitting, or lying down. To do Kegel exercises: ? Tighten the muscles of your abdomen and the muscles that surround your birth canal. ? Hold for a few seconds. ? Relax. ? Repeat until you have done this 5 times in a row.  To prevent hemorrhoids from developing or getting worse: ? Drink enough fluid to keep your urine clear or pale yellow. ? Avoid straining when having a bowel movement. ? Take over-the-counter medicines and stool softeners as told by your health care provider.  BREAST CARE  Wear a tight-fitting bra.  Avoid taking over-the-counter pain medicine for breast discomfort.  Apply ice to the breasts to help with discomfort as needed: ? Put ice in a plastic bag. ? Place a towel between your skin and the bag. ?  Leave the ice on for 20 minutes or as told by your health care provider.  NUTRITION  Eat a well-balanced diet.  Do not try to lose weight quickly by cutting back on calories.  Take your prenatal vitamins until your postpartum checkup or until your health care provider tells you to stop.  POSTPARTUM DEPRESSION You may find yourself crying for no apparent reason and unable to cope with all of the changes that come with having a newborn. This mood is called postpartum depression. Postpartum depression happens  because your hormone levels change after delivery. If you have postpartum depression, get support from your partner, friends, and family. If the depression does not go away on its own after several weeks, contact your health care provider. BREAST SELF-EXAM Do a breast self-exam each month, at the same time of the month. If you are breastfeeding, check your breasts just after a feeding, when your breasts are less full. If you are breastfeeding and your period has started, check your breasts on day 5, 6, or 7 of your period. Report any lumps, bumps, or discharge to your health care provider. Know that breasts are normally lumpy if you are breastfeeding. This is temporary, and it is not a health risk. INTIMACY AND SEXUALITY Avoid sexual activity for at least 3-4 weeks after delivery or until the brownish-red vaginal flow is completely gone. If you want to avoid pregnancy, use some form of birth control. You can get pregnant after delivery, even if you have not had your period. SEEK MEDICAL CARE IF:  You feel unable to cope with the changes that a child brings to your life, and these feelings do not go away after several weeks.  You notice a lump, a bump, or discharge on your breast.  SEEK IMMEDIATE MEDICAL CARE IF:  Blood soaks your pad in 1 hour or less.  You have: ? Severe pain or cramping in your lower abdomen. ? A bad-smelling vaginal discharge. ? A fever that is not controlled by medicine. ? A fever, and an area of your breast is red and sore. ? Pain or redness in your calf. ? Sudden, severe chest pain. ? Shortness of breath. ? Painful or bloody urination. ? Problems with your vision.  You vomit for 12 hours or longer.  You develop a severe headache.  You have serious thoughts about hurting yourself, your child, or anyone else.  This information is not intended to replace advice given to you by your health care provider. Make sure you discuss any questions you have with your health  care provider. Document Released: 08/05/2000 Document Revised: 01/14/2016 Document Reviewed: 02/09/2015 Elsevier Interactive Patient Education  2017 [employer]. Postpartum Depression and Baby Blues The postpartum period begins right after the birth of a baby. During this time, there is often a great amount of joy and excitement. It is also a time of many changes in the life of the parents. Regardless of how many times a mother gives birth, each child brings new challenges and dynamics to the family. It is not unusual to have feelings of excitement along with confusing shifts in moods, emotions, and thoughts. All mothers are at risk of developing postpartum depression or the "baby blues." These mood changes can occur right after giving birth, or they may occur many months after giving birth. The baby blues or postpartum depression can be mild or severe. Additionally, postpartum depression can go away rather quickly, or it can be a long-term condition. What are the causes?  Raised hormone levels and the rapid drop in those levels are thought to be a main cause of postpartum depression and the baby blues. A number of hormones change during and after pregnancy. Estrogen and progesterone usually decrease right after the delivery of your baby. The levels of thyroid hormone and various cortisol steroids also rapidly drop. Other factors that play a role in these mood changes include major life events and genetics. What increases the risk? If you have any of the following risks for the baby blues or postpartum depression, know what symptoms to watch out for during the postpartum period. Risk factors that may increase the likelihood of getting the baby blues or postpartum depression include:  Having a personal or family history of depression.  Having depression while being pregnant.  Having premenstrual mood issues or mood issues related to oral contraceptives.  Having a lot of life stress.  Having marital  conflict.  Lacking a social support network.  Having a baby with special needs.  Having health problems, such as diabetes.  What are the signs or symptoms? Symptoms of baby blues include:  Brief changes in mood, such as going from extreme happiness to sadness.  Decreased concentration.  Difficulty sleeping.  Crying spells, tearfulness.  Irritability.  Anxiety.  Symptoms of postpartum depression typically begin within the first month after giving birth. These symptoms include:  Difficulty sleeping or excessive sleepiness.  Marked weight loss.  Agitation.  Feelings of worthlessness.  Lack of interest in activity or food.  Postpartum psychosis is a very serious condition and can be dangerous. Fortunately, it is rare. Displaying any of the following symptoms is cause for immediate medical attention. Symptoms of postpartum psychosis include:  Hallucinations and delusions.  Bizarre or disorganized behavior.  Confusion or disorientation.  How is this diagnosed? A diagnosis is made by an evaluation of your symptoms. There are no medical or lab tests that lead to a diagnosis, but there are various questionnaires that a health care provider may use to identify those with the baby blues, postpartum depression, or psychosis. Often, a screening tool called the [place] Postnatal Depression Scale is used to diagnose depression in the postpartum period. How is this treated? The baby blues usually goes away on its own in 1-2 weeks. Social support is often all that is needed. You will be encouraged to get adequate sleep and rest. Occasionally, you may be given medicines to help you sleep. Postpartum depression requires treatment because it can last several months or longer if it is not treated. Treatment may include individual or group therapy, medicine, or both to address any social, physiological, and psychological factors that may play a role in the depression. Regular exercise, a  healthy diet, rest, and social support may also be strongly recommended. Postpartum psychosis is more serious and needs treatment right away. Hospitalization is often needed. Follow these instructions at home:  Get as much rest as you can. Nap when the baby sleeps.  Exercise regularly. Some women find yoga and walking to be beneficial.  Eat a balanced and nourishing diet.  Do little things that you enjoy. Have a cup of tea, take a bubble bath, read your favorite magazine, or listen to your favorite music.  Avoid alcohol.  Ask for help with household chores, cooking, grocery shopping, or running errands as needed. Do not try to do everything.  Talk to people close to you about how you are feeling. Get support from your partner, family members, friends, or other new moms.  Try to stay positive in how you think. Think about the things you are grateful for.  Do not spend a lot of time alone.  Only take over-the-counter or prescription medicine as directed by your health care provider.  Keep all your postpartum appointments.  Let your health care provider know if you have any concerns. Contact a health care provider if: You are having a reaction to or problems with your medicine. Get help right away if:  You have suicidal feelings.  You think you may harm the baby or someone else. This information is not intended to replace advice given to you by your health care provider. Make sure you discuss any questions you have with your health care provider. Document Released: 05/12/2004 Document Revised: 01/14/2016 Document Reviewed: 05/20/2013 Elsevier Interactive Patient Education  2017 [place]. Breastfeeding Choosing to breastfeed is one of the best decisions you can make for yourself and your baby. A change in hormones during pregnancy causes your breasts to make breast milk in your milk-producing glands. Hormones prevent breast milk from being released before your baby is born.  They also prompt milk flow after birth. Once breastfeeding has begun, thoughts of your baby, as well as his or her sucking or crying, can stimulate the release of milk from your milk-producing glands. Benefits of breastfeeding Research shows that breastfeeding offers many health benefits for infants and mothers. It also offers a cost-free and convenient way to feed your baby. For your baby  Your first milk (colostrum) helps your baby's digestive system to function better.  Special cells in your milk (antibodies) help your baby to fight off infections.  Breastfed babies are less likely to develop asthma, allergies, obesity, or type 2 diabetes. They are also at lower risk for sudden infant death syndrome (SIDS).  Nutrients in breast milk are better able to meet your babys needs compared to infant formula.  Breast milk improves your baby's brain development. For you  Breastfeeding helps to create a very special bond between you and your baby.  Breastfeeding is convenient. Breast milk costs nothing and is always available at the correct temperature.  Breastfeeding helps to burn calories. It helps you to lose the weight that you gained during pregnancy.  Breastfeeding makes your uterus return faster to its size before pregnancy. It also slows bleeding (lochia) after you give birth.  Breastfeeding helps to lower your risk of developing type 2 diabetes, osteoporosis, rheumatoid arthritis, cardiovascular disease, and breast, ovarian, uterine, and endometrial cancer later in life. Breastfeeding basics Starting breastfeeding  Find a comfortable place to sit or lie down, with your neck and back well-supported.  Place a pillow or a rolled-up blanket under your baby to bring him or her to the level of your breast (if you are seated). Nursing pillows are specially designed to help support your arms and your baby while you breastfeed.  Make sure that your baby's tummy (abdomen) is facing your  abdomen.  Gently massage your breast. With your fingertips, massage from the outer edges of your breast inward toward the nipple. This encourages milk flow. If your milk flows slowly, you may need to continue this action during the feeding.  Support your breast with 4 fingers underneath and your thumb above your nipple (make the letter "C" with your hand). Make sure your fingers are well away from your nipple and your babys mouth.  Stroke your baby's lips gently with your finger or nipple.  When your baby's mouth is open wide enough, quickly  bring your baby to your breast, placing your entire nipple and as much of the areola as possible into your baby's mouth. The areola is the colored area around your nipple. ? More areola should be visible above your baby's upper lip than below the lower lip. ? Your baby's lips should be opened and extended outward (flanged) to ensure an adequate, comfortable latch. ? Your baby's tongue should be between his or her lower gum and your breast.  Make sure that your baby's mouth is correctly positioned around your nipple (latched). Your baby's lips should create a seal on your breast and be turned out (everted).  It is common for your baby to suck about 2-3 minutes in order to start the flow of breast milk. Latching Teaching your baby how to latch onto your breast properly is very important. An improper latch can cause nipple pain, decreased milk supply, and poor weight gain in your baby. Also, if your baby is not latched onto your nipple properly, he or she may swallow some air during feeding. This can make your baby fussy. Burping your baby when you switch breasts during the feeding can help to get rid of the air. However, teaching your baby to latch on properly is still the best way to prevent fussiness from swallowing air while breastfeeding. Signs that your baby has successfully latched onto your nipple  Silent tugging or silent sucking, without causing you  pain. Infant's lips should be extended outward (flanged).  Swallowing heard between every 3-4 sucks once your milk has started to flow (after your let-down milk reflex occurs).  Muscle movement above and in front of his or her ears while sucking.  Signs that your baby has not successfully latched onto your nipple  Sucking sounds or smacking sounds from your baby while breastfeeding.  Nipple pain.  If you think your baby has not latched on correctly, slip your finger into the corner of your babys mouth to break the suction and place it between your baby's gums. Attempt to start breastfeeding again. Signs of successful breastfeeding Signs from your baby  Your baby will gradually decrease the number of sucks or will completely stop sucking.  Your baby will fall asleep.  Your baby's body will relax.  Your baby will retain a small amount of milk in his or her mouth.  Your baby will let go of your breast by himself or herself.  Signs from you  Breasts that have increased in firmness, weight, and size 1-3 hours after feeding.  Breasts that are softer immediately after breastfeeding.  Increased milk volume, as well as a change in milk consistency and color by the fifth day of breastfeeding.  Nipples that are not sore, cracked, or bleeding.  Signs that your baby is getting enough milk  Wetting at least 1-2 diapers during the first 24 hours after birth.  Wetting at least 5-6 diapers every 24 hours for the first week after birth. The urine should be clear or pale yellow by the age of 5 days.  Wetting 6-8 diapers every 24 hours as your baby continues to grow and develop.  At least 3 stools in a 24-hour period by the age of 5 days. The stool should be soft and yellow.  At least 3 stools in a 24-hour period by the age of 7 days. The stool should be seedy and yellow.  No loss of weight greater than 10% of birth weight during the first 3 days of life.  Average weight gain  of 4-7 oz  (113-198 g) per week after the age of 4 days.  Consistent daily weight gain by the age of 5 days, without weight loss after the age of 2 weeks. After a feeding, your baby may spit up a small amount of milk. This is normal. Breastfeeding frequency and duration Frequent feeding will help you make more milk and can prevent sore nipples and extremely full breasts (breast engorgement). Breastfeed when you feel the need to reduce the fullness of your breasts or when your baby shows signs of hunger. This is called "breastfeeding on demand." Signs that your baby is hungry include:  Increased alertness, activity, or restlessness.  Movement of the head from side to side.  Opening of the mouth when the corner of the mouth or cheek is stroked (rooting).  Increased sucking sounds, smacking lips, cooing, sighing, or squeaking.  Hand-to-mouth movements and sucking on fingers or hands.  Fussing or crying.  Avoid introducing a pacifier to your baby in the first 4-6 weeks after your baby is born. After this time, you may choose to use a pacifier. Research has shown that pacifier use during the first year of a baby's life decreases the risk of sudden infant death syndrome (SIDS). Allow your baby to feed on each breast as long as he or she wants. When your baby unlatches or falls asleep while feeding from the first breast, offer the second breast. Because newborns are often sleepy in the first few weeks of life, you may need to awaken your baby to get him or her to feed. Breastfeeding times will vary from baby to baby. However, the following rules can serve as a guide to help you make sure that your baby is properly fed:  Newborns (babies 65 weeks of age or younger) may breastfeed every 1-3 hours.  Newborns should not go without breastfeeding for longer than 3 hours during the day or 5 hours during the night.  You should breastfeed your baby a minimum of 8 times in a 24-hour period.  Breast milk  pumping Pumping and storing breast milk allows you to make sure that your baby is exclusively fed your breast milk, even at times when you are unable to breastfeed. This is especially important if you go back to work while you are still breastfeeding, or if you are not able to be present during feedings. Your lactation consultant can help you find a method of pumping that works best for you and give you guidelines about how long it is safe to store breast milk. Caring for your breasts while you breastfeed Nipples can become dry, cracked, and sore while breastfeeding. The following recommendations can help keep your breasts moisturized and healthy:  Avoid using soap on your nipples.  Wear a supportive bra designed especially for nursing. Avoid wearing underwire-style bras or extremely tight bras (sports bras).  Air-dry your nipples for 3-4 minutes after each feeding.  Use only cotton bra pads to absorb leaked breast milk. Leaking of breast milk between feedings is normal.  Use lanolin on your nipples after breastfeeding. Lanolin helps to maintain your skin's normal moisture barrier. Pure lanolin is not harmful (not toxic) to your baby. You may also hand express a few drops of breast milk and gently massage that milk into your nipples and allow the milk to air-dry.  In the first few weeks after giving birth, some women experience breast engorgement. Engorgement can make your breasts feel heavy, warm, and tender to the touch. Engorgement peaks  within 3-5 days after you give birth. The following recommendations can help to ease engorgement:  Completely empty your breasts while breastfeeding or pumping. You may want to start by applying warm, moist heat (in the shower or with warm, water-soaked hand towels) just before feeding or pumping. This increases circulation and helps the milk flow. If your baby does not completely empty your breasts while breastfeeding, pump any extra milk after he or she is  finished.  Apply ice packs to your breasts immediately after breastfeeding or pumping, unless this is too uncomfortable for you. To do this: ? Put ice in a plastic bag. ? Place a towel between your skin and the bag. ? Leave the ice on for 20 minutes, 2-3 times a day.  Make sure that your baby is latched on and positioned properly while breastfeeding.  If engorgement persists after 48 hours of following these recommendations, contact your health care provider or a Science writer. Overall health care recommendations while breastfeeding  Eat 3 healthy meals and 3 snacks every day. Well-nourished mothers who are breastfeeding need an additional 450-500 calories a day. You can meet this requirement by increasing the amount of a balanced diet that you eat.  Drink enough water to keep your urine pale yellow or clear.  Rest often, relax, and continue to take your prenatal vitamins to prevent fatigue, stress, and low vitamin and mineral levels in your body (nutrient deficiencies).  Do not use any products that contain nicotine or tobacco, such as cigarettes and e-cigarettes. Your baby may be harmed by chemicals from cigarettes that pass into breast milk and exposure to secondhand smoke. If you need help quitting, ask your health care provider.  Avoid alcohol.  Do not use illegal drugs or marijuana.  Talk with your health care provider before taking any medicines. These include over-the-counter and prescription medicines as well as vitamins and herbal supplements. Some medicines that may be harmful to your baby can pass through breast milk.  It is possible to become pregnant while breastfeeding. If birth control is desired, ask your health care provider about options that will be safe while breastfeeding your baby. Where to find more information: [employer]: www.llli.org Contact a health care provider if:  You feel like you want to stop breastfeeding or have become  frustrated with breastfeeding.  Your nipples are cracked or bleeding.  Your breasts are red, tender, or warm.  You have: ? Painful breasts or nipples. ? A swollen area on either breast. ? A fever or chills. ? Nausea or vomiting. ? Drainage other than breast milk from your nipples.  Your breasts do not become full before feedings by the fifth day after you give birth.  You feel sad and depressed.  Your baby is: ? Too sleepy to eat well. ? Having trouble sleeping. ? More than 64 week old and wetting fewer than 6 diapers in a 24-hour period. ? Not gaining weight by 4 days of age.  Your baby has fewer than 3 stools in a 24-hour period.  Your baby's skin or the white parts of his or her eyes become yellow. Get help right away if:  Your baby is overly tired (lethargic) and does not want to wake up and feed.  Your baby develops an unexplained fever. Summary  Breastfeeding offers many health benefits for infant and mothers.  Try to breastfeed your infant when he or she shows early signs of hunger.  Gently tickle or stroke your baby's lips with  your finger or nipple to allow the baby to open his or her mouth. Bring the baby to your breast. Make sure that much of the areola is in your baby's mouth. Offer one side and burp the baby before you offer the other side.  Talk with your health care provider or lactation consultant if you have questions or you face problems as you breastfeed. This information is not intended to replace advice given to you by your health care provider. Make sure you discuss any questions you have with your health care provider. Document Released: 08/08/2005 Document Revised: 09/09/2016 Document Reviewed: 09/09/2016 Elsevier Interactive Patient Education  [employer].

## 2017-10-17 ENCOUNTER — Encounter: Payer: No Typology Code available for payment source | Admitting: Certified Nurse Midwife

## 2017-10-18 ENCOUNTER — Encounter: Payer: Self-pay | Admitting: Certified Nurse Midwife

## 2017-10-19 ENCOUNTER — Ambulatory Visit (INDEPENDENT_AMBULATORY_CARE_PROVIDER_SITE_OTHER): Payer: Medicaid Other | Admitting: Certified Nurse Midwife

## 2017-10-19 ENCOUNTER — Telehealth: Payer: Self-pay | Admitting: Certified Nurse Midwife

## 2017-10-19 VITALS — BP 137/85 | HR 73 | Ht 66.0 in | Wt 154.4 lb

## 2017-10-19 DIAGNOSIS — R4589 Other symptoms and signs involving emotional state: Secondary | ICD-10-CM

## 2017-10-19 DIAGNOSIS — S3141XD Laceration without foreign body of vagina and vulva, subsequent encounter: Secondary | ICD-10-CM

## 2017-10-19 NOTE — Telephone Encounter (Signed)
The patient called and stated that she thinks her incision may be infected and would like to speak with a  Nurse as soon as possible. No other information was disclosed. Please advise.

## 2017-10-19 NOTE — Telephone Encounter (Signed)
Pt is 5 days s/p svd. Delivery note states bilateral labial lacerations. She states her incisions ?? are more sore today than before. NO redness or red streaks noted. Unsure if she has a fever. NO drainage from incision site.  Pos for vaginal bleeding. No pain meds used today. Yesterday she did take motrin 600 q6. Pt is  unsure why she hasn't taken any today for the pain. She "just has not thought about it". My chart message dated yesterday pt is concerned about ppd. Appt made with JML today at 2:45.

## 2017-10-19 NOTE — Progress Notes (Signed)
Pt is here for an incision check. Pt also has some depression issues. 9 on screening scale.

## 2017-10-19 NOTE — Patient Instructions (Signed)
Postpartum Depression and Baby Blues The postpartum period begins right after the birth of a baby. During this time, there is often a great amount of joy and excitement. It is also a time of many changes in the life of the parents. Regardless of how many times a mother gives birth, each child brings new challenges and dynamics to the family. It is not unusual to have feelings of excitement along with confusing shifts in moods, emotions, and thoughts. All mothers are at risk of developing postpartum depression or the "baby blues." These mood changes can occur right after giving birth, or they may occur many months after giving birth. The baby blues or postpartum depression can be mild or severe. Additionally, postpartum depression can go away rather quickly, or it can be a long-term condition. What are the causes? Raised hormone levels and the rapid drop in those levels are thought to be a main cause of postpartum depression and the baby blues. A number of hormones change during and after pregnancy. Estrogen and progesterone usually decrease right after the delivery of your baby. The levels of thyroid hormone and various cortisol steroids also rapidly drop. Other factors that play a role in these mood changes include major life events and genetics. What increases the risk? If you have any of the following risks for the baby blues or postpartum depression, know what symptoms to watch out for during the postpartum period. Risk factors that may increase the likelihood of getting the baby blues or postpartum depression include:  Having a personal or family history of depression.  Having depression while being pregnant.  Having premenstrual mood issues or mood issues related to oral contraceptives.  Having a lot of life stress.  Having marital conflict.  Lacking a social support network.  Having a baby with special needs.  Having health problems, such as diabetes.  What are the signs or  symptoms? Symptoms of baby blues include:  Brief changes in mood, such as going from extreme happiness to sadness.  Decreased concentration.  Difficulty sleeping.  Crying spells, tearfulness.  Irritability.  Anxiety.  Symptoms of postpartum depression typically begin within the first month after giving birth. These symptoms include:  Difficulty sleeping or excessive sleepiness.  Marked weight loss.  Agitation.  Feelings of worthlessness.  Lack of interest in activity or food.  Postpartum psychosis is a very serious condition and can be dangerous. Fortunately, it is rare. Displaying any of the following symptoms is cause for immediate medical attention. Symptoms of postpartum psychosis include:  Hallucinations and delusions.  Bizarre or disorganized behavior.  Confusion or disorientation.  How is this diagnosed? A diagnosis is made by an evaluation of your symptoms. There are no medical or lab tests that lead to a diagnosis, but there are various questionnaires that a health care provider may use to identify those with the baby blues, postpartum depression, or psychosis. Often, a screening tool called the [place] Postnatal Depression Scale is used to diagnose depression in the postpartum period. How is this treated? The baby blues usually goes away on its own in 1-2 weeks. Social support is often all that is needed. You will be encouraged to get adequate sleep and rest. Occasionally, you may be given medicines to help you sleep. Postpartum depression requires treatment because it can last several months or longer if it is not treated. Treatment may include individual or group therapy, medicine, or both to address any social, physiological, and psychological factors that may play a role in the  depression. Regular exercise, a healthy diet, rest, and social support may also be strongly recommended. Postpartum psychosis is more serious and needs treatment right away.  Hospitalization is often needed. Follow these instructions at home:  Get as much rest as you can. Nap when the baby sleeps.  Exercise regularly. Some women find yoga and walking to be beneficial.  Eat a balanced and nourishing diet.  Do little things that you enjoy. Have a cup of tea, take a bubble bath, read your favorite magazine, or listen to your favorite music.  Avoid alcohol.  Ask for help with household chores, cooking, grocery shopping, or running errands as needed. Do not try to do everything.  Talk to people close to you about how you are feeling. Get support from your partner, family members, friends, or other new moms.  Try to stay positive in how you think. Think about the things you are grateful for.  Do not spend a lot of time alone.  Only take over-the-counter or prescription medicine as directed by your health care provider.  Keep all your postpartum appointments.  Let your health care provider know if you have any concerns. Contact a health care provider if: You are having a reaction to or problems with your medicine. Get help right away if:  You have suicidal feelings.  You think you may harm the baby or someone else. This information is not intended to replace advice given to you by your health care provider. Make sure you discuss any questions you have with your health care provider. Document Released: 05/12/2004 Document Revised: 01/14/2016 Document Reviewed: 05/20/2013 Elsevier Interactive Patient Education  2017 Elsevier Inc.  

## 2017-10-24 ENCOUNTER — Encounter: Payer: Self-pay | Admitting: Certified Nurse Midwife

## 2017-10-24 NOTE — Progress Notes (Signed)
GYN ENCOUNTER NOTE  Subjective:       Tina Hughes is a 27 y.o. G20P2002 female here for gynecologic evaluation of the following issues:  1. Left labial tear 2. Tearfulness  Reports crying and depressed mood for the last week. Oldest child told her that she "doesn't love him anymore".   Denies difficulty breathing or respiratory distress, chest pain, abdominal pain, excessive vaginal bleeding, dysuria, and leg pain or swelling. No suicidal or homicidal ideations.    Gynecologic History  No LMP recorded. Recently pregnancy and breastfeeding.   Contraception: abstinence.    Last Pap: 2016. Results were: normal  Obstetric History OB History  Gravida Para Term Preterm AB Living  2 2 2     2   SAB TAB Ectopic Multiple Live Births        0 2    # Outcome Date GA Lbr Len/2nd Weight Sex Delivery Anes PTL Lv  2 Term 10/14/17 [redacted]w[redacted]d 03:14 / 00:26 7 lb 13.6 oz (3.56 kg) F Vag-Spont EPI  LIV  1 Term 11/09/14    M Vag-Spont   LIV      Past Medical History:  Diagnosis Date  . Anxiety   . Vegetarian     No past surgical history on file.  Current Outpatient Medications on File Prior to Visit  Medication Sig Dispense Refill  . ferrous sulfate 325 (65 FE) MG tablet Take 1 tablet (325 mg total) by mouth 3 (three) times daily with meals. 90 tablet 3  . ibuprofen (ADVIL,MOTRIN) 600 MG tablet Take 1 tablet (600 mg total) by mouth every 6 (six) hours. 30 tablet 0  . Prenatal Vit-Fe Fumarate-FA (PRENATAL MULTIVITAMIN) TABS tablet Take 1 tablet by mouth daily at 12 noon.     No current facility-administered medications on file prior to visit.     No Known Allergies  Social History   Socioeconomic History  . Marital status: Married    Spouse name: Not on file  . Number of children: Not on file  . Years of education: Not on file  . Highest education level: Not on file  Social Needs  . Financial resource strain: Not on file  . Food insecurity - worry: Not on file  . Food  insecurity - inability: Not on file  . Transportation needs - medical: Not on file  . Transportation needs - non-medical: Not on file  Occupational History  . Not on file  Tobacco Use  . Smoking status: Former Research scientist (life sciences)  . Smokeless tobacco: Never Used  Substance and Sexual Activity  . Alcohol use: No    Comment: occasional  . Drug use: No  . Sexual activity: Yes    Birth control/protection: None  Other Topics Concern  . Not on file  Social History Narrative  . Not on file    Family History  Problem Relation Age of Onset  . Cancer Paternal Grandmother        skin cancer    The following portions of the patient's history were reviewed and updated as appropriate: allergies, current medications, past family history, past medical history, past social history, past surgical history and problem list.  Review of Systems  Review of Systems - Negative except as noted above. History obtained from the patient  Objective:   BP 137/85   Pulse 73   Ht 5\' 6"  (1.676 m)   Wt 154 lb 7 oz (70.1 kg)   Breastfeeding? Yes   BMI 24.93 kg/m   CONSTITUTIONAL: Well-developed, well-nourished  female in no acute distress, crying.   PELVIC:  External Genitalia: Normal  Vagina: Normal, laceration well approximated   Assessment:   1. Tearfulness   2. Postpartum care and examination   3. Labial tear, subsequent encounter   Plan:   Loose suture removed without difficulty.   Reassurance offered.   Discussed home treatment measures and information given for local support groups.   Reviewed red flag symptoms and when to call.   RTC x 1-2 weeks for mood check or sooner if needed.    Diona Fanti, CNM [facility], [facility]

## 2017-10-26 ENCOUNTER — Ambulatory Visit (INDEPENDENT_AMBULATORY_CARE_PROVIDER_SITE_OTHER): Payer: Medicaid Other | Admitting: Certified Nurse Midwife

## 2017-10-26 ENCOUNTER — Encounter: Payer: Self-pay | Admitting: Certified Nurse Midwife

## 2017-10-26 VITALS — BP 128/84 | HR 68 | Wt 148.7 lb

## 2017-10-26 DIAGNOSIS — R4586 Emotional lability: Secondary | ICD-10-CM | POA: Diagnosis not present

## 2017-10-26 NOTE — Progress Notes (Signed)
GYN ENCOUNTER NOTE  Subjective:       Tina Hughes is a 27 y.o. [phone] female here for follow up mood check. Seen last week for evaluation of postpartum depression.   Reports feeling better. Getting out of house. Using MIL as helper. No suicidal or homicidal ideations.   Denies difficulty breathing or respiratory distress, chest pain, abdominal pain, excessive vaginal bleeding, dysuria, and or leg pain or swelling.    Gynecologic History  No LMP recorded.  Contraception: abstinence  Last Pap: 2016. Results were: normal  Obstetric History  OB History  Gravida Para Term Preterm AB Living  2 2 2     2   SAB TAB Ectopic Multiple Live Births        0 2    # Outcome Date GA Lbr Len/2nd Weight Sex Delivery Anes PTL Lv  2 Term 10/14/17 [redacted]w[redacted]d 03:14 / 00:26 7 lb 13.6 oz (3.56 kg) F Vag-Spont EPI  LIV  1 Term 11/09/14    M Vag-Spont   LIV      Past Medical History:  Diagnosis Date  . Anxiety   . Vegetarian     History reviewed. No pertinent surgical history.  Current Outpatient Medications on File Prior to Visit  Medication Sig Dispense Refill  . ferrous sulfate 325 (65 FE) MG tablet Take 1 tablet (325 mg total) by mouth 3 (three) times daily with meals. 90 tablet 3  . ibuprofen (ADVIL,MOTRIN) 600 MG tablet Take 1 tablet (600 mg total) by mouth every 6 (six) hours. 30 tablet 0  . Prenatal Vit-Fe Fumarate-FA (PRENATAL MULTIVITAMIN) TABS tablet Take 1 tablet by mouth daily at 12 noon.     No current facility-administered medications on file prior to visit.     No Known Allergies  Social History   Socioeconomic History  . Marital status: Married    Spouse name: Not on file  . Number of children: Not on file  . Years of education: Not on file  . Highest education level: Not on file  Social Needs  . Financial resource strain: Not on file  . Food insecurity - worry: Not on file  . Food insecurity - inability: Not on file  . Transportation needs - medical: Not on file   . Transportation needs - non-medical: Not on file  Occupational History  . Not on file  Tobacco Use  . Smoking status: Former Research scientist (life sciences)  . Smokeless tobacco: Never Used  Substance and Sexual Activity  . Alcohol use: No    Comment: occasional  . Drug use: No  . Sexual activity: Not Currently    Birth control/protection: None  Other Topics Concern  . Not on file  Social History Narrative  . Not on file    Family History  Problem Relation Age of Onset  . Cancer Paternal Grandmother        skin cancer    The following portions of the patient's history were reviewed and updated as appropriate: allergies, current medications, past family history, past medical history, past social history, past surgical history and problem list.  Review of Systems  Review of Systems - Negative except as noted above.  History obtained from the patient  Objective:   BP 128/84   Pulse 68   Wt 148 lb 11.2 oz (67.4 kg)   Breastfeeding? Yes   BMI 24.00 kg/m    CONSTITUTIONAL: Well-developed, well-nourished female in no acute distress.   Depression screen [facility] 2/9 10/26/2017  Decreased Interest 0  Down, Depressed, Hopeless 0  PHQ - 2 Score 0  Altered sleeping 0  Tired, decreased energy 1  Change in appetite -  Feeling bad or failure about yourself  0  Trouble concentrating 0  Moving slowly or fidgety/restless 0  Suicidal thoughts 0  PHQ-9 Score 1    Assessment:   1. Mood change   2. Postpartum care and examination   Plan:   Reassurance offered.   Reviewed red flag symptoms and when to call.   RTC x 4 weeks for PPV or sooner if needed.    Diona Fanti, CNM [facility], [facility]

## 2017-10-26 NOTE — Patient Instructions (Signed)
Postpartum Depression and Baby Blues The postpartum period begins right after the birth of a baby. During this time, there is often a great amount of joy and excitement. It is also a time of many changes in the life of the parents. Regardless of how many times a mother gives birth, each child brings new challenges and dynamics to the family. It is not unusual to have feelings of excitement along with confusing shifts in moods, emotions, and thoughts. All mothers are at risk of developing postpartum depression or the "baby blues." These mood changes can occur right after giving birth, or they may occur many months after giving birth. The baby blues or postpartum depression can be mild or severe. Additionally, postpartum depression can go away rather quickly, or it can be a long-term condition. What are the causes? Raised hormone levels and the rapid drop in those levels are thought to be a main cause of postpartum depression and the baby blues. A number of hormones change during and after pregnancy. Estrogen and progesterone usually decrease right after the delivery of your baby. The levels of thyroid hormone and various cortisol steroids also rapidly drop. Other factors that play a role in these mood changes include major life events and genetics. What increases the risk? If you have any of the following risks for the baby blues or postpartum depression, know what symptoms to watch out for during the postpartum period. Risk factors that may increase the likelihood of getting the baby blues or postpartum depression include:  Having a personal or family history of depression.  Having depression while being pregnant.  Having premenstrual mood issues or mood issues related to oral contraceptives.  Having a lot of life stress.  Having marital conflict.  Lacking a social support network.  Having a baby with special needs.  Having health problems, such as diabetes.  What are the signs or  symptoms? Symptoms of baby blues include:  Brief changes in mood, such as going from extreme happiness to sadness.  Decreased concentration.  Difficulty sleeping.  Crying spells, tearfulness.  Irritability.  Anxiety.  Symptoms of postpartum depression typically begin within the first month after giving birth. These symptoms include:  Difficulty sleeping or excessive sleepiness.  Marked weight loss.  Agitation.  Feelings of worthlessness.  Lack of interest in activity or food.  Postpartum psychosis is a very serious condition and can be dangerous. Fortunately, it is rare. Displaying any of the following symptoms is cause for immediate medical attention. Symptoms of postpartum psychosis include:  Hallucinations and delusions.  Bizarre or disorganized behavior.  Confusion or disorientation.  How is this diagnosed? A diagnosis is made by an evaluation of your symptoms. There are no medical or lab tests that lead to a diagnosis, but there are various questionnaires that a health care provider may use to identify those with the baby blues, postpartum depression, or psychosis. Often, a screening tool called the [place] Postnatal Depression Scale is used to diagnose depression in the postpartum period. How is this treated? The baby blues usually goes away on its own in 1-2 weeks. Social support is often all that is needed. You will be encouraged to get adequate sleep and rest. Occasionally, you may be given medicines to help you sleep. Postpartum depression requires treatment because it can last several months or longer if it is not treated. Treatment may include individual or group therapy, medicine, or both to address any social, physiological, and psychological factors that may play a role in the  depression. Regular exercise, a healthy diet, rest, and social support may also be strongly recommended. Postpartum psychosis is more serious and needs treatment right away.  Hospitalization is often needed. Follow these instructions at home:  Get as much rest as you can. Nap when the baby sleeps.  Exercise regularly. Some women find yoga and walking to be beneficial.  Eat a balanced and nourishing diet.  Do little things that you enjoy. Have a cup of tea, take a bubble bath, read your favorite magazine, or listen to your favorite music.  Avoid alcohol.  Ask for help with household chores, cooking, grocery shopping, or running errands as needed. Do not try to do everything.  Talk to people close to you about how you are feeling. Get support from your partner, family members, friends, or other new moms.  Try to stay positive in how you think. Think about the things you are grateful for.  Do not spend a lot of time alone.  Only take over-the-counter or prescription medicine as directed by your health care provider.  Keep all your postpartum appointments.  Let your health care provider know if you have any concerns. Contact a health care provider if: You are having a reaction to or problems with your medicine. Get help right away if:  You have suicidal feelings.  You think you may harm the baby or someone else. This information is not intended to replace advice given to you by your health care provider. Make sure you discuss any questions you have with your health care provider. Document Released: 05/12/2004 Document Revised: 01/14/2016 Document Reviewed: 05/20/2013 Elsevier Interactive Patient Education  2017 Elsevier Inc.  

## 2017-11-03 ENCOUNTER — Ambulatory Visit
Admission: EM | Admit: 2017-11-03 | Discharge: 2017-11-03 | Disposition: A | Discharge status: Home / Self Care | Payer: Medicaid Other | Attending: Family Medicine | Admitting: Family Medicine

## 2017-11-03 DIAGNOSIS — Z87891 Personal history of nicotine dependence: Secondary | ICD-10-CM | POA: Insufficient documentation

## 2017-11-03 DIAGNOSIS — Z79899 Other long term (current) drug therapy: Secondary | ICD-10-CM | POA: Insufficient documentation

## 2017-11-03 DIAGNOSIS — R51 Headache: Secondary | ICD-10-CM | POA: Diagnosis not present

## 2017-11-03 DIAGNOSIS — J029 Acute pharyngitis, unspecified: Secondary | ICD-10-CM | POA: Diagnosis not present

## 2017-11-03 DIAGNOSIS — Z791 Long term (current) use of non-steroidal anti-inflammatories (NSAID): Secondary | ICD-10-CM | POA: Insufficient documentation

## 2017-11-03 DIAGNOSIS — Z808 Family history of malignant neoplasm of other organs or systems: Secondary | ICD-10-CM | POA: Diagnosis not present

## 2017-11-03 DIAGNOSIS — R509 Fever, unspecified: Secondary | ICD-10-CM | POA: Diagnosis not present

## 2017-11-03 LAB — RAPID STREP SCREEN (MED CTR MEBANE ONLY): Streptococcus, Group A Screen (Direct): NEGATIVE

## 2017-11-03 MED ORDER — OSELTAMIVIR PHOSPHATE 75 MG PO CAPS: 75.0000 mg | ORAL_CAPSULE | Freq: Two times a day (BID) | ORAL | 0 refills | Status: DC

## 2017-11-03 MED ORDER — AMOXICILLIN 500 MG PO TABS: 500.0000 mg | ORAL_TABLET | Freq: Two times a day (BID) | ORAL | 0 refills | Status: DC

## 2017-11-03 NOTE — ED Triage Notes (Signed)
A couple of days ago pt started noticing symptoms.  Has son at home currently being treated for strep.   some redness noted in back of throat.   Reports fever, chills, and some difficulty swallowing.  "and fatigue".   Ambulatory to triage.  In NAD.

## 2017-11-03 NOTE — Discharge Instructions (Signed)
Your test was negative.  We will await the culture.  I am placing you on antibiotics given your exposure.  Take care  Dr. Lacinda Axon

## 2017-11-03 NOTE — ED Triage Notes (Signed)
Last advil around 1415.  Tmax at home was 101.8 prior to advil.

## 2017-11-03 NOTE — ED Provider Notes (Signed)
[facility]    CSN: [id number] Arrival date & time: 11/03/17  1448  History   Chief Complaint Chief Complaint  Patient presents with  . Sore Throat   HPI  27 year old female presents with sore throat, fever, headache, chills.  Patient reports that she began to have sore throat yesterday.  Her son is currently being treated for strep.  She reports that today she developed fever and chills.  Fever, T-max 101.8.  She is been taking Advil with improvement in her fever but she still has symptoms.  Additionally, she reports fatigue.  No cough.  No other respiratory symptoms.  No known exacerbating factors.  No other reported symptoms.  No other complaints.  Past Medical History:  Diagnosis Date  . Anxiety   . Vegetarian     Patient Active Problem List   Diagnosis Date Noted  . Postpartum anemia 10/15/2017  . Gestational hypertension 10/11/2017  . Rh negative state in antepartum period 03/31/2017  . Anxiety 03/31/2017  . Vegetarian diet 03/31/2017    History reviewed. No pertinent surgical history.  OB History    Gravida Para Term Preterm AB Living   2 2 2     2    SAB TAB Ectopic Multiple Live Births         0 2       Home Medications    Prior to Admission medications   Medication Sig Start Date End Date Taking? Authorizing Provider  amoxicillin (AMOXIL) 500 MG tablet Take 1 tablet (500 mg total) by mouth 2 (two) times daily. 11/03/17   Coral Spikes, DO  ferrous sulfate 325 (65 FE) MG tablet Take 1 tablet (325 mg total) by mouth 3 (three) times daily with meals. 10/16/17   Lawhorn, Lara Mulch, CNM  ibuprofen (ADVIL,MOTRIN) 600 MG tablet Take 1 tablet (600 mg total) by mouth every 6 (six) hours. 10/16/17   Diona Fanti, CNM  oseltamivir (TAMIFLU) 75 MG capsule Take 1 capsule (75 mg total) by mouth every 12 (twelve) hours. 11/03/17   Coral Spikes, DO  Prenatal Vit-Fe Fumarate-FA (PRENATAL MULTIVITAMIN) TABS tablet Take 1 tablet by mouth daily at  12 noon.    [provider]    Family History Family History  Problem Relation Age of Onset  . Cancer Paternal Grandmother        skin cancer    Social History Social History   Tobacco Use  . Smoking status: Former Research scientist (life sciences)  . Smokeless tobacco: Never Used  Substance Use Topics  . Alcohol use: No    Comment: occasional  . Drug use: No     Allergies   Patient has no known allergies.   Review of Systems Review of Systems  Constitutional: Positive for chills, fatigue and fever.  HENT: Positive for sore throat.      Physical Exam Triage Vital Signs ED Triage Vitals  Enc Vitals Group     BP 11/03/17 1502 (!) 128/91     Pulse Rate 11/03/17 1502 (!) 122     Resp 11/03/17 1502 16     Temp 11/03/17 1502 99.7 F (37.6 C)     Temp Source 11/03/17 1502 Oral     SpO2 11/03/17 1502 96 %     Weight --      Height --      Head Circumference --      Peak Flow --      Pain Score 11/03/17 1504 3     Pain Loc --  Pain Edu? --      Excl. in [place]? --    Updated Vital Signs BP (!) 128/91 (BP Location: Right Arm)   Pulse (!) 122   Temp 99.7 F (37.6 C) (Oral)   Resp 16   SpO2 96%  Physical Exam  Constitutional: She is oriented to person, place, and time. She appears well-developed. No distress.  HENT:  Oropharynx with mild erythema.  Eyes: Conjunctivae are normal. Left eye exhibits no discharge.  Neck: Neck supple.  Cardiovascular: Regular rhythm.  Tachycardic.  Pulmonary/Chest: Effort normal and breath sounds normal. She has no wheezes. She has no rales.  Lymphadenopathy:    She has cervical adenopathy.  Neurological: She is alert and oriented to person, place, and time.  Psychiatric: She has a normal mood and affect. Her behavior is normal.  Nursing note and vitals reviewed.  UC Treatments / Results  Labs (all labs ordered are listed, but only abnormal results are displayed) Labs Reviewed  RAPID STREP SCREEN (NOT AT [facility])  CULTURE, GROUP A STREP  [facility])    EKG  EKG Interpretation None       Radiology No results found.  Procedures Procedures (including critical care time)  Medications Ordered in UC Medications - No data to display   Initial Impression / Assessment and Plan / UC Course  I have reviewed the triage vital signs and the nursing notes.  Pertinent labs & imaging results that were available during my care of the patient were reviewed by me and considered in my medical decision making (see chart for details).     27 year old female presents with fever, chills, tachycardia, sore throat.  Recent exposure to strep.  Her strep test was negative here.  I am still concerned that she may test positive with the culture.  Additionally, could possibly be influenza.  She has young children at home.  She is currently breast-feeding.  I am placing her on both amoxicillin and Tamiflu after lengthy discussion with the patient.  Both medications are okay with breast-feeding.   Final Clinical Impressions(s) / UC Diagnoses   Final diagnoses:  Pharyngitis, unspecified etiology    ED Discharge Orders        Ordered    amoxicillin (AMOXIL) 500 MG tablet  2 times daily     11/03/17 1621    oseltamivir (TAMIFLU) 75 MG capsule  Every 12 hours     11/03/17 1624     Controlled Substance Prescriptions [place] Controlled Substance Registry consulted? Not Applicable   Coral Spikes, DO 11/03/17 1635

## 2017-11-06 LAB — CULTURE, GROUP A STREP (THRC)

## 2017-11-23 ENCOUNTER — Ambulatory Visit (INDEPENDENT_AMBULATORY_CARE_PROVIDER_SITE_OTHER): Payer: Medicaid Other | Admitting: Certified Nurse Midwife

## 2017-11-23 NOTE — Progress Notes (Signed)
Pt is here for a post partum visit. Is breast feeding, has not had a period, she will think about bc,has resumed intercourse with no problems. Screening 1

## 2017-11-23 NOTE — Patient Instructions (Signed)
Preventive Care 18-39 Years, Female Preventive care refers to lifestyle choices and visits with your health care provider that can promote health and wellness. What does preventive care include?  A yearly physical exam. This is also called an annual well check.  Dental exams once or twice a year.  Routine eye exams. Ask your health care provider how often you should have your eyes checked.  Personal lifestyle choices, including: ? Daily care of your teeth and gums. ? Regular physical activity. ? Eating a healthy diet. ? Avoiding tobacco and drug use. ? Limiting alcohol use. ? Practicing safe sex. ? Taking vitamin and mineral supplements as recommended by your health care provider. What happens during an annual well check? The services and screenings done by your health care provider during your annual well check will depend on your age, overall health, lifestyle risk factors, and family history of disease. Counseling Your health care provider may ask you questions about your:  Alcohol use.  Tobacco use.  Drug use.  Emotional well-being.  Home and relationship well-being.  Sexual activity.  Eating habits.  Work and work Statistician.  Method of birth control.  Menstrual cycle.  Pregnancy history.  Screening You may have the following tests or measurements:  Height, weight, and BMI.  Diabetes screening. This is done by checking your blood sugar (glucose) after you have not eaten for a while (fasting).  Blood pressure.  Lipid and cholesterol levels. These may be checked every 5 years starting at age 66.  Skin check.  Hepatitis C blood test.  Hepatitis B blood test.  Sexually transmitted disease (STD) testing.  BRCA-related cancer screening. This may be done if you have a family history of breast, ovarian, tubal, or peritoneal cancers.  Pelvic exam and Pap test. This may be done every 3 years starting at age 40. Starting at age 59, this may be done every 5  years if you have a Pap test in combination with an HPV test.  Discuss your test results, treatment options, and if necessary, the need for more tests with your health care provider. Vaccines Your health care provider may recommend certain vaccines, such as:  Influenza vaccine. This is recommended every year.  Tetanus, diphtheria, and acellular pertussis (Tdap, Td) vaccine. You may need a Td booster every 10 years.  Varicella vaccine. You may need this if you have not been vaccinated.  HPV vaccine. If you are 69 or younger, you may need three doses over 6 months.  Measles, mumps, and rubella (MMR) vaccine. You may need at least one dose of MMR. You may also need a second dose.  Pneumococcal 13-valent conjugate (PCV13) vaccine. You may need this if you have certain conditions and were not previously vaccinated.  Pneumococcal polysaccharide (PPSV23) vaccine. You may need one or two doses if you smoke cigarettes or if you have certain conditions.  Meningococcal vaccine. One dose is recommended if you are age 27-21 years and a first-year college student living in a residence hall, or if you have one of several medical conditions. You may also need additional booster doses.  Hepatitis A vaccine. You may need this if you have certain conditions or if you travel or work in places where you may be exposed to hepatitis A.  Hepatitis B vaccine. You may need this if you have certain conditions or if you travel or work in places where you may be exposed to hepatitis B.  Haemophilus influenzae type b (Hib) vaccine. You may need this if  you have certain risk factors.  Talk to your health care provider about which screenings and vaccines you need and how often you need them. This information is not intended to replace advice given to you by your health care provider. Make sure you discuss any questions you have with your health care provider. Document Released: 10/04/2001 Document Revised: 04/27/2016  Document Reviewed: 06/09/2015 Elsevier Interactive Patient Education  [employer].

## 2017-11-23 NOTE — Progress Notes (Signed)
Subjective:    Tina Hughes is a 27 y.o. G65P2002 Caucasian female who presents for a postpartum visit. She is 6 weeks postpartum following a spontaneous vaginal delivery at 39+1 gestational weeks. Anesthesia: epidural. I have fully reviewed the prenatal and intrapartum course.   Postpartum course has been uncomplicated. Baby's course has been uncomplicated. Baby is feeding by breast. Bleeding no bleeding. Bowel function is normal. Bladder function is normal.   Patient is sexually active. Contraception method is condoms. Postpartum depression screening: negative. Score 1.  Last pap 2016 and was normal.  Denies difficulty breathing or respiratory distress, chest pain, abdominal pain, dysuria, and leg pain or swelling.   The following portions of the patient's history were reviewed and updated as appropriate: allergies, current medications, past medical history, past surgical history and problem list.  Review of Systems  Pertinent items are noted in HPI.   Objective:   BP 125/77   Pulse 65   Ht 5\' 6"  (1.676 m)   Wt 141 lb 4 oz (64.1 kg)   Breastfeeding? Yes   BMI 22.80 kg/m   General:  alert, cooperative and no distress   Breasts:  deferred, no complaints  Lungs: clear to auscultation bilaterally  Heart:  regular rate and rhythm  Abdomen: soft, nontender   Vulva: normal  Vagina: normal vagina  Cervix:  closed  Corpus: Well-involuted  Adnexa:  Non-palpable        Depression screen [facility] 2/9 11/23/2017  Decreased Interest 0  Down, Depressed, Hopeless 0  PHQ - 2 Score 0  Altered sleeping 0  Tired, decreased energy 0  Change in appetite 0  Feeling bad or failure about yourself  0  Trouble concentrating 1  Moving slowly or fidgety/restless 0  Suicidal thoughts 0  PHQ-9 Score 1   Assessment:   Postpartum exam Six (6) wks s/p spontaneous vaginal delivery Breastfeeding Depression screening Contraception counseling   Plan:   Reviewed all forms of birth control options  available including abstinence; fertility period awareness methods; over the counter/barrier methods; hormonal contraceptive medication including pill, patch, ring, injection,contraceptive implant; hormonal and nonhormonal IUDs; permanent sterilization options including vasectomy and the various tubal sterilization modalities. Risks and benefits reviewed.  Questions were answered.  Information was given to patient to review.   Follow up in: 6 months for Annual Exam or earlier if needed.   Diona Fanti, CNM [facility], [facility]

## 2018-05-25 ENCOUNTER — Ambulatory Visit: Payer: Medicaid Other | Admitting: Obstetrics and Gynecology

## 2018-05-25 ENCOUNTER — Other Ambulatory Visit (HOSPITAL_COMMUNITY)
Admission: RE | Admit: 2018-05-25 | Discharge: 2018-05-25 | Disposition: A | Discharge status: Home / Self Care | Payer: Medicaid Other | Source: Ambulatory Visit | Attending: Obstetrics and Gynecology | Admitting: Obstetrics and Gynecology

## 2018-05-25 ENCOUNTER — Encounter: Payer: Self-pay | Admitting: Obstetrics and Gynecology

## 2018-05-25 VITALS — BP 123/81 | HR 67 | Ht 66.0 in | Wt 124.7 lb

## 2018-05-25 DIAGNOSIS — Z789 Other specified health status: Secondary | ICD-10-CM

## 2018-05-25 DIAGNOSIS — Z01419 Encounter for gynecological examination (general) (routine) without abnormal findings: Secondary | ICD-10-CM

## 2018-05-25 DIAGNOSIS — Z23 Encounter for immunization: Secondary | ICD-10-CM

## 2018-05-25 NOTE — Progress Notes (Signed)
  Subjective:     MICKALA LATON is a married white 27 y.o. female and is here for a comprehensive physical exam. The patient reports no problems.  Social History   Socioeconomic History  . Marital status: Married    Spouse name: Not on file  . Number of children: Not on file  . Years of education: Not on file  . Highest education level: Not on file  Occupational History  . Not on file  Social Needs  . Financial resource strain: Not on file  . Food insecurity:    Worry: Not on file    Inability: Not on file  . Transportation needs:    Medical: Not on file    Non-medical: Not on file  Tobacco Use  . Smoking status: Former Research scientist (life sciences)  . Smokeless tobacco: Never Used  Substance and Sexual Activity  . Alcohol use: No    Comment: occasional  . Drug use: No  . Sexual activity: Yes    Birth control/protection: None, Condom  Lifestyle  . Physical activity:    Days per week: Not on file    Minutes per session: Not on file  . Stress: Not on file  Relationships  . Social connections:    Talks on phone: Not on file    Gets together: Not on file    Attends religious service: Not on file    Active member of club or organization: Not on file    Attends meetings of clubs or organizations: Not on file    Relationship status: Not on file  . Intimate partner violence:    Fear of current or ex partner: Not on file    Emotionally abused: Not on file    Physically abused: Not on file    Forced sexual activity: Not on file  Other Topics Concern  . Not on file  Social History Narrative  . Not on file   Health Maintenance  Topic Date Due  . PAP SMEAR  06/08/2010  . INFLUENZA VACCINE  03/22/2018  . TETANUS/TDAP  08/02/2027  . HIV Screening  Completed    The following portions of the patient's history were reviewed and updated as appropriate: allergies, current medications, past family history, past medical history, past social history, past surgical history and problem  list.  Review of Systems A comprehensive review of systems was negative.   Objective:    General appearance: alert, cooperative and appears stated age Neck: no adenopathy, no carotid bruit, no JVD, supple, symmetrical, trachea midline and thyroid not enlarged, symmetric, no tenderness/mass/nodules Lungs: clear to auscultation bilaterally Breasts: normal appearance, no masses or tenderness Heart: regular rate and rhythm, S1, S2 normal, no murmur, click, rub or gallop Abdomen: soft, non-tender; bowel sounds normal; no masses,  no organomegaly Pelvic: cervix normal in appearance, external genitalia normal, no adnexal masses or tenderness, no cervical motion tenderness, rectovaginal septum normal, uterus normal size, shape, and consistency and vagina normal without discharge    Assessment:    Healthy female exam. Needs flu vaccine, h/o anemia     Plan:   Flu vaccine given Labs obtained and will follow up accordingly. RTC 1 year or as needed   Derrill Bagnell,CNM See After Visit Summary for Counseling Recommendations

## 2018-05-25 NOTE — Patient Instructions (Signed)
Preventive Care 18-39 Years, Female Preventive care refers to lifestyle choices and visits with your health care provider that can promote health and wellness. What does preventive care include?  A yearly physical exam. This is also called an annual well check.  Dental exams once or twice a year.  Routine eye exams. Ask your health care provider how often you should have your eyes checked.  Personal lifestyle choices, including: ? Daily care of your teeth and gums. ? Regular physical activity. ? Eating a healthy diet. ? Avoiding tobacco and drug use. ? Limiting alcohol use. ? Practicing safe sex. ? Taking vitamin and mineral supplements as recommended by your health care provider. What happens during an annual well check? The services and screenings done by your health care provider during your annual well check will depend on your age, overall health, lifestyle risk factors, and family history of disease. Counseling Your health care provider may ask you questions about your:  Alcohol use.  Tobacco use.  Drug use.  Emotional well-being.  Home and relationship well-being.  Sexual activity.  Eating habits.  Work and work Statistician.  Method of birth control.  Menstrual cycle.  Pregnancy history.  Screening You may have the following tests or measurements:  Height, weight, and BMI.  Diabetes screening. This is done by checking your blood sugar (glucose) after you have not eaten for a while (fasting).  Blood pressure.  Lipid and cholesterol levels. These may be checked every 5 years starting at age 38.  Skin check.  Hepatitis C blood test.  Hepatitis B blood test.  Sexually transmitted disease (STD) testing.  BRCA-related cancer screening. This may be done if you have a family history of breast, ovarian, tubal, or peritoneal cancers.  Pelvic exam and Pap test. This may be done every 3 years starting at age 38. Starting at age 30, this may be done  every 5 years if you have a Pap test in combination with an HPV test.  Discuss your test results, treatment options, and if necessary, the need for more tests with your health care provider. Vaccines Your health care provider may recommend certain vaccines, such as:  Influenza vaccine. This is recommended every year.  Tetanus, diphtheria, and acellular pertussis (Tdap, Td) vaccine. You may need a Td booster every 10 years.  Varicella vaccine. You may need this if you have not been vaccinated.  HPV vaccine. If you are 39 or younger, you may need three doses over 6 months.  Measles, mumps, and rubella (MMR) vaccine. You may need at least one dose of MMR. You may also need a second dose.  Pneumococcal 13-valent conjugate (PCV13) vaccine. You may need this if you have certain conditions and were not previously vaccinated.  Pneumococcal polysaccharide (PPSV23) vaccine. You may need one or two doses if you smoke cigarettes or if you have certain conditions.  Meningococcal vaccine. One dose is recommended if you are age 68-21 years and a first-year college student living in a residence hall, or if you have one of several medical conditions. You may also need additional booster doses.  Hepatitis A vaccine. You may need this if you have certain conditions or if you travel or work in places where you may be exposed to hepatitis A.  Hepatitis B vaccine. You may need this if you have certain conditions or if you travel or work in places where you may be exposed to hepatitis B.  Haemophilus influenzae type b (Hib) vaccine. You may need this  if you have certain risk factors.  Talk to your health care provider about which screenings and vaccines you need and how often you need them. This information is not intended to replace advice given to you by your health care provider. Make sure you discuss any questions you have with your health care provider. Document Released: 10/04/2001 Document Revised:  04/27/2016 Document Reviewed: 06/09/2015 Elsevier Interactive Patient Education  2018 Elsevier Inc.  

## 2018-05-26 LAB — FERRITIN: Ferritin: 61 ng/mL (ref 15–150)

## 2018-05-26 LAB — VITAMIN D 25 HYDROXY (VIT D DEFICIENCY, FRACTURES): VIT D 25 HYDROXY: 38.4 ng/mL (ref 30.0–100.0)

## 2018-05-26 LAB — CBC
HEMATOCRIT: 38.8 % (ref 34.0–46.6)
HEMOGLOBIN: 13.3 g/dL (ref 11.1–15.9)
MCH: 28.4 pg (ref 26.6–33.0)
MCHC: 34.3 g/dL (ref 31.5–35.7)
MCV: 83 fL (ref 79–97)
Platelets: 190 10*3/uL (ref 150–450)
RBC: 4.69 x10E6/uL (ref 3.77–5.28)
RDW: 14.2 % (ref 12.3–15.4)
WBC: 6 10*3/uL (ref 3.4–10.8)

## 2018-05-26 LAB — B12 AND FOLATE PANEL
Folate: >20 ng/mL (ref >3.0)
VITAMIN B 12: 448 pg/mL (ref 232–1245)

## 2018-05-28 LAB — CYTOLOGY - PAP
Chlamydia: NEGATIVE
Diagnosis: NEGATIVE
Neisseria Gonorrhea: NEGATIVE

## 2018-12-10 ENCOUNTER — Encounter: Payer: Self-pay | Admitting: Emergency Medicine

## 2018-12-10 ENCOUNTER — Emergency Department
Admission: EM | Admit: 2018-12-10 | Discharge: 2018-12-11 | Disposition: A | Discharge status: Home / Self Care | Payer: Self-pay | Attending: Emergency Medicine | Admitting: Emergency Medicine

## 2018-12-10 ENCOUNTER — Emergency Department: Payer: Self-pay

## 2018-12-10 ENCOUNTER — Other Ambulatory Visit: Payer: Self-pay

## 2018-12-10 DIAGNOSIS — N938 Other specified abnormal uterine and vaginal bleeding: Secondary | ICD-10-CM | POA: Insufficient documentation

## 2018-12-10 DIAGNOSIS — F17210 Nicotine dependence, cigarettes, uncomplicated: Secondary | ICD-10-CM | POA: Insufficient documentation

## 2018-12-10 LAB — CBC
HCT: 40.4 % (ref 36.0–46.0)
Hemoglobin: 13.2 g/dL (ref 12.0–15.0)
MCH: 29 pg (ref 26.0–34.0)
MCHC: 32.7 g/dL (ref 30.0–36.0)
MCV: 88.8 fL (ref 80.0–100.0)
Platelets: 225 10*3/uL (ref 150–400)
RBC: 4.55 MIL/uL (ref 3.87–5.11)
RDW: 13.2 % (ref 11.5–15.5)
WBC: 8.3 10*3/uL (ref 4.0–10.5)
nRBC: 0 % (ref 0.0–0.2)

## 2018-12-10 LAB — BASIC METABOLIC PANEL
Anion gap: 11 (ref 5–15)
BUN: 19 mg/dL (ref 6–20)
CO2: 23 mmol/L (ref 22–32)
Calcium: 9.1 mg/dL (ref 8.9–10.3)
Chloride: 103 mmol/L (ref 98–111)
Creatinine, Ser: 0.7 mg/dL (ref 0.44–1.00)
GFR calc Af Amer: >60 mL/min (ref >60)
GFR calc non Af Amer: >60 mL/min (ref >60)
Glucose, Bld: 95 mg/dL (ref 70–99)
Potassium: 3.8 mmol/L (ref 3.5–5.1)
Sodium: 137 mmol/L (ref 135–145)

## 2018-12-10 LAB — POCT PREGNANCY, URINE: Preg Test, Ur: NEGATIVE

## 2018-12-10 NOTE — Discharge Instructions (Signed)
You have been seen in the emergency department today for vaginal bleeding.  Your work-up shows no concerning findings.  Please follow-up with your doctor or OB/GYN if you continue to have heavy bleeding.  Return to the emergency department for any abdominal pain, weakness, lightheadedness, or any other symptom personally concerning to yourself.

## 2018-12-10 NOTE — ED Provider Notes (Signed)
[facility] Emergency Department Provider Note  Time seen: 9:07 PM  I have reviewed the triage vital signs and the nursing notes.   HISTORY  Chief Complaint Vaginal Bleeding   HPI ANAB VIVAR is a 28 y.o. female with a past medical history of anxiety, presents to the emergency department for vaginal bleeding.  According to the patient she started her menstrual cycle yesterday, however today since 12 PM she has been experiencing very heavy bleeding she states up to 6 tampons per hour.  States earlier today she was experiencing very large clots as well.  States her last menstrual period was 1 month previously.  Denies any abdominal pain or significant cramping.  No cough congestion fever recent travel.   Past Medical History:  Diagnosis Date  . Anxiety   . Vegetarian     Patient Active Problem List   Diagnosis Date Noted  . History of gestational hypertension 10/11/2017  . Anxiety 03/31/2017  . Vegetarian diet 03/31/2017    History reviewed. No pertinent surgical history.  Prior to Admission medications   Medication Sig Start Date End Date Taking? Authorizing Provider  ibuprofen (ADVIL,MOTRIN) 600 MG tablet Take 1 tablet (600 mg total) by mouth every 6 (six) hours. Patient not taking: Reported on 05/25/2018 10/16/17   Diona Fanti, CNM    No Known Allergies  Family History  Problem Relation Age of Onset  . Cancer Paternal Grandmother        skin cancer  . Irritable bowel syndrome Mother     Social History Social History   Tobacco Use  . Smoking status: Current Some Day Smoker  . Smokeless tobacco: Never Used  Substance Use Topics  . Alcohol use: No    Comment: occasional  . Drug use: No    Review of Systems Constitutional: Negative for fever. ENT: Negative for recent illness/congestion Cardiovascular: Negative for chest pain. Respiratory: Negative for shortness of breath. Gastrointestinal: Negative for abdominal pain,  vomiting and diarrhea. Genitourinary: Negative for urinary compaints.  Positive for vaginal bleeding. Skin: Negative for skin complaints  Neurological: Negative for headache All other ROS negative  ____________________________________________   PHYSICAL EXAM:  VITAL SIGNS: ED Triage Vitals  Enc Vitals Group     BP 12/10/18 2052 (!) 161/92     Pulse Rate 12/10/18 2052 92     Resp 12/10/18 2052 18     Temp 12/10/18 2052 97.8 F (36.6 C)     Temp Source 12/10/18 2052 Oral     SpO2 12/10/18 2052 99 %     Weight 12/10/18 2051 130 lb (59 kg)     Height 12/10/18 2051 5\' 6"  (1.676 m)     Head Circumference --      Peak Flow --      Pain Score 12/10/18 2051 0     Pain Loc --      Pain Edu? --      Excl. in [place]? --     Constitutional: Alert and oriented. Well appearing and in no distress. Eyes: Normal exam ENT      Head: Normocephalic and atraumatic.      Mouth/Throat: Mucous membranes are moist. Cardiovascular: Normal rate, regular rhythm.  Respiratory: Normal respiratory effort without tachypnea nor retractions. Breath sounds are clear  Gastrointestinal: Soft and nontender. No distention.  Musculoskeletal: Nontender with normal range of motion in all extremities. . Neurologic:  Normal speech and language. No gross focal neurologic deficits Skin:  Skin is warm, dry and  intact.  Psychiatric: Mood and affect are normal. Speech and behavior are normal.   ____________________________________________   RADIOLOGY  Ultrasound pending  ____________________________________________   INITIAL IMPRESSION / ASSESSMENT AND PLAN / ED COURSE  Pertinent labs & imaging results that were available during my care of the patient were reviewed by me and considered in my medical decision making (see chart for details).   Patient presents emergency department for heavy vaginal bleeding starting around 12 PM today with large clots.  Overall the patient appears well nontender abdomen, very  well-appearing on physical exam, no distress.  We will check basic labs as well as a pregnancy test and obtain an ultrasound.  Differential at this time would include dysfunctional uterine bleeding, menorrhagia, miscarriage, anemia.  Patient's lab work is normal.  Pelvic ultrasound is pending.  Patient care signed out to Dr. Owens Shark.  CHINITA SCHIMPF was evaluated in Emergency Department on 12/10/2018 for the symptoms described in the history of present illness. She was evaluated in the context of the global COVID-19 pandemic, which necessitated consideration that the patient might be at risk for infection with the SARS-CoV-2 virus that causes COVID-19. Institutional protocols and algorithms that pertain to the evaluation of patients at risk for COVID-19 are in a state of rapid change based on information released by regulatory bodies including the CDC and federal and state organizations. These policies and algorithms were followed during the patient's care in the ED.  ____________________________________________   FINAL CLINICAL IMPRESSION(S) / ED DIAGNOSES  Dysfunctional uterine bleeding   Harvest Dark, MD 12/10/18 2249

## 2018-12-10 NOTE — ED Triage Notes (Signed)
Patient ambulatory to triage with steady gait, without difficulty or distress noted; pt reports heavy menstrual bleeding since noon with no hx of same; st using 6-10 tampons/hr; denies pain or accomp symptoms

## 2019-06-19 ENCOUNTER — Ambulatory Visit: Payer: Self-pay | Admitting: Obstetrics and Gynecology

## 2019-06-19 ENCOUNTER — Other Ambulatory Visit: Payer: Self-pay

## 2019-06-19 ENCOUNTER — Encounter: Payer: Self-pay | Admitting: Obstetrics and Gynecology

## 2019-06-19 VITALS — BP 124/88 | HR 74 | Ht 66.0 in | Wt 140.1 lb

## 2019-06-19 DIAGNOSIS — N912 Amenorrhea, unspecified: Secondary | ICD-10-CM

## 2019-06-19 LAB — POCT URINE PREGNANCY: Preg Test, Ur: POSITIVE — AB

## 2019-06-19 IMAGING — US TRANSVAGINAL ULTRASOUND OF PELVIS
1 series · 13 of 25 positions shown · non-contrast
Comparison: CT abdomen pelvis dated October 18, 2005.

CLINICAL DATA: Dysfunctional uterine bleeding.

EXAM:
TRANSABDOMINAL AND TRANSVAGINAL ULTRASOUND OF PELVIS
TECHNIQUE: Both transabdominal and transvaginal ultrasound examinations of the
pelvis were performed. Transabdominal technique was performed for
global imaging of the pelvis including uterus, ovaries, adnexal
regions, and pelvic cul-de-sac. It was necessary to proceed with
endovaginal exam following the transabdominal exam to visualize the
endometrium and adnexa.

[Series 1: transvaginal ultrasound of pelvis · 0.21mm/px · 13 of 110 slices shown]
[im 1/110]
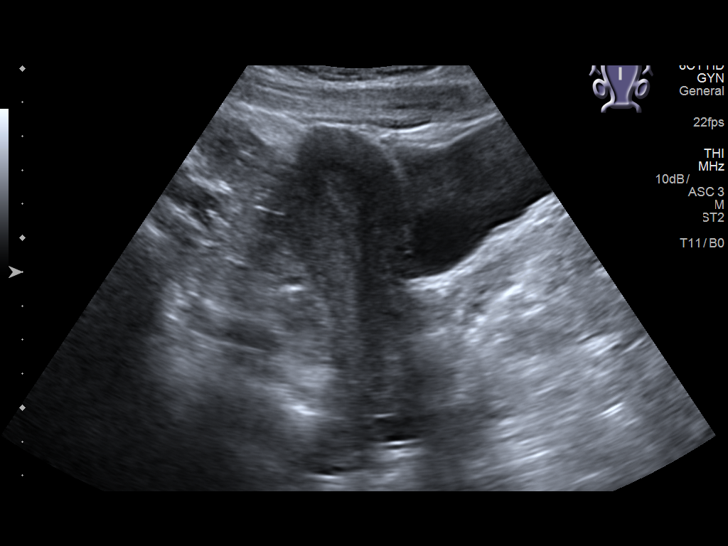
[im 10/110]
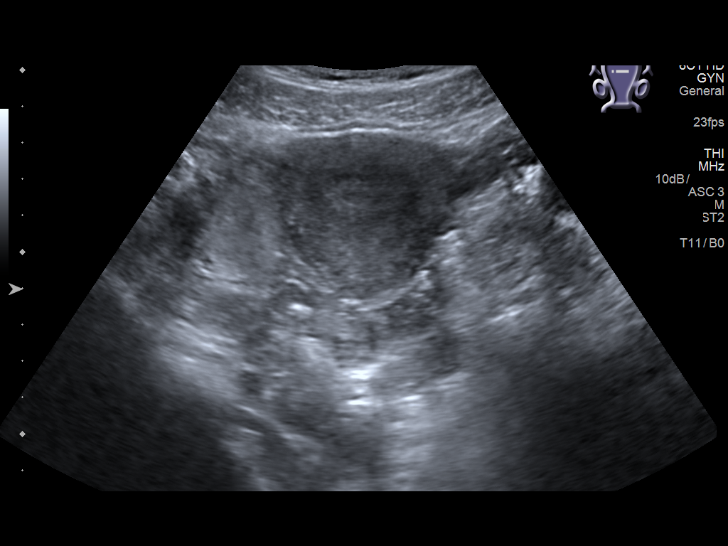
[im 19/110]
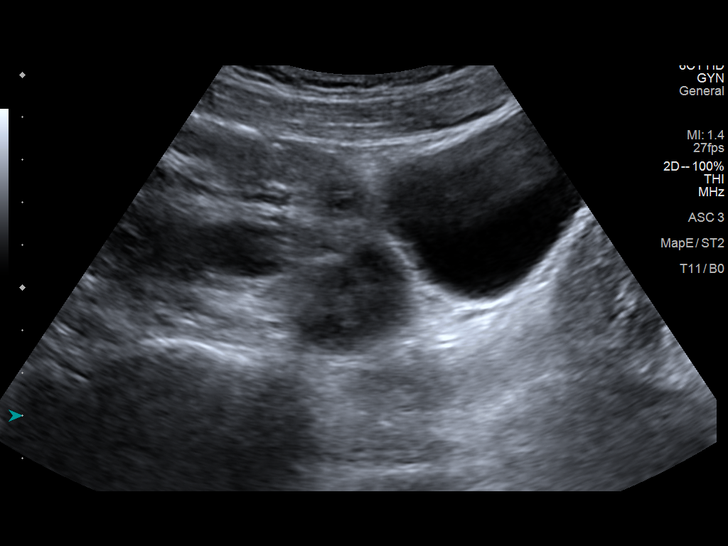
[im 28/110]
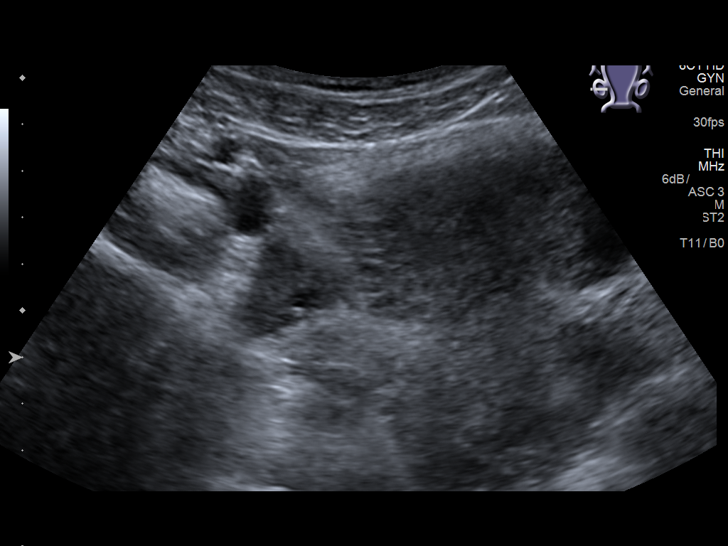
[im 37/110]
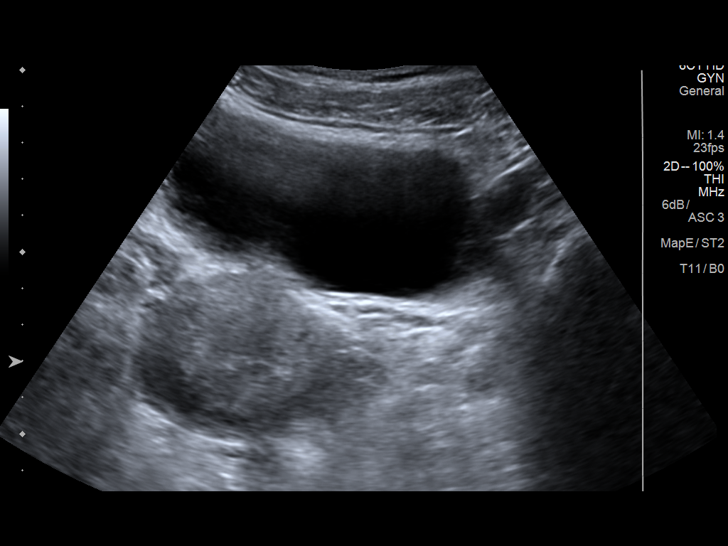
[im 46/110]
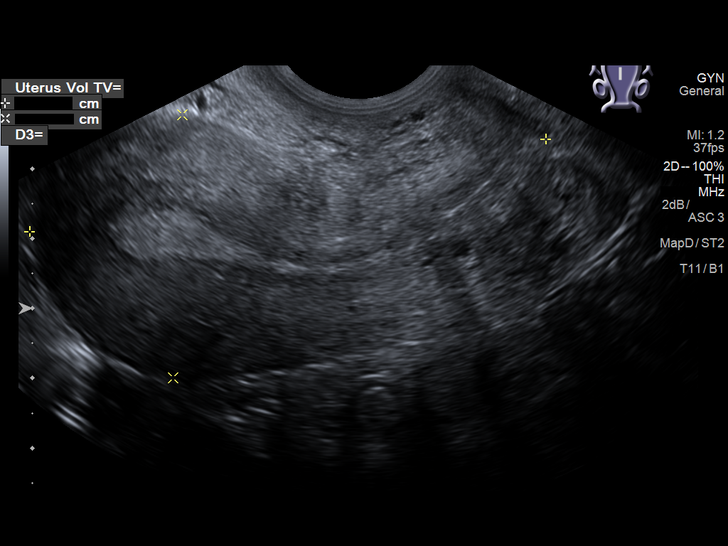
[im 55/110]
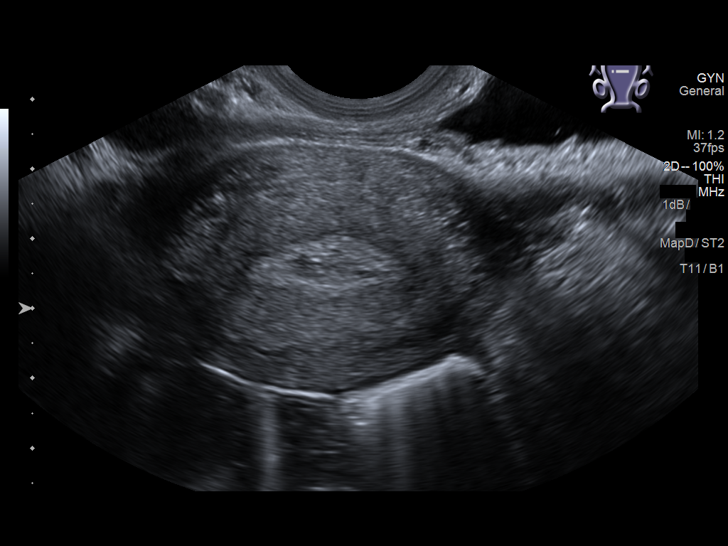
[im 64/110]
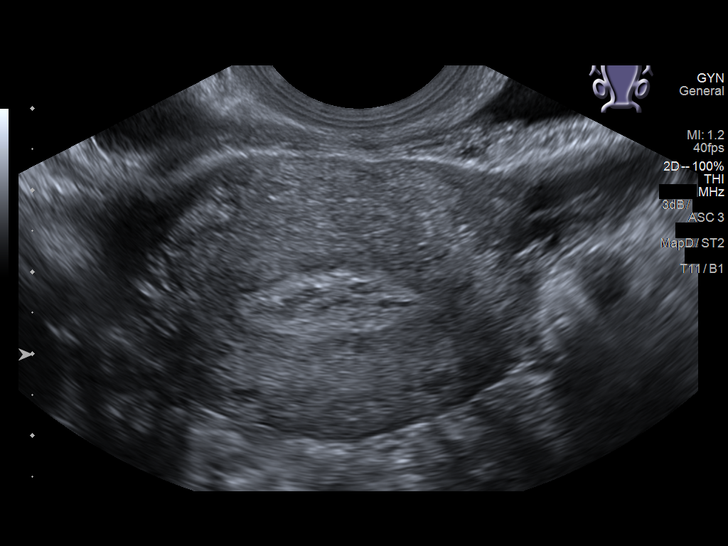
[im 73/110]
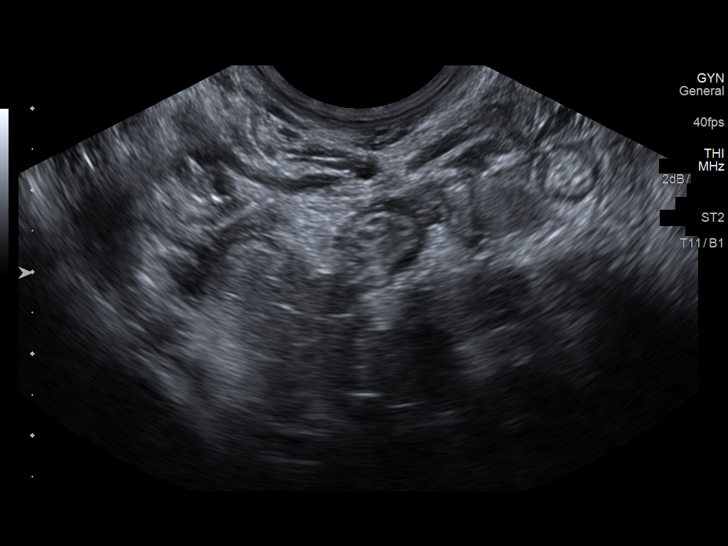
[im 82/110]
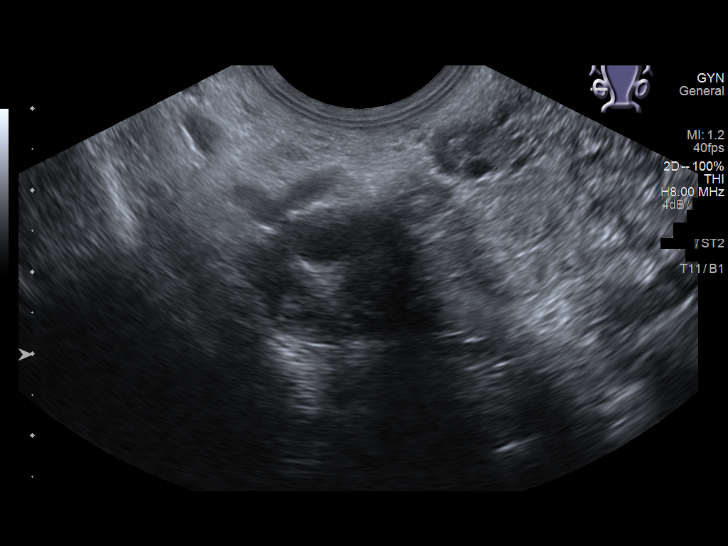
[im 91/110]
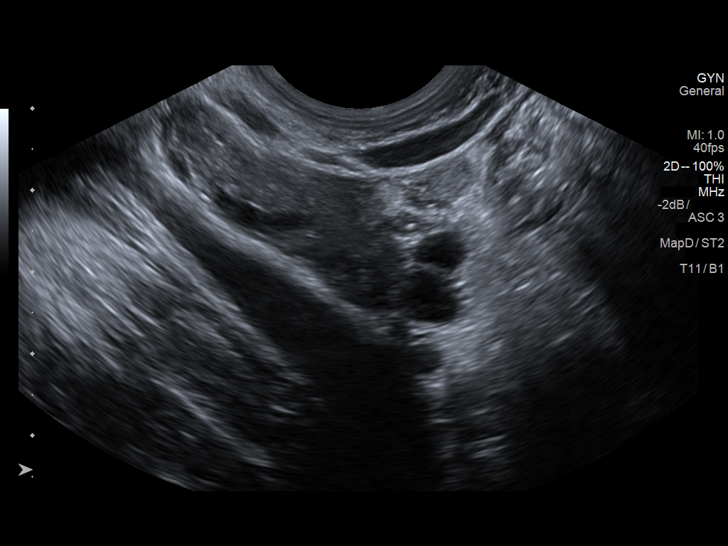
[im 100/110]
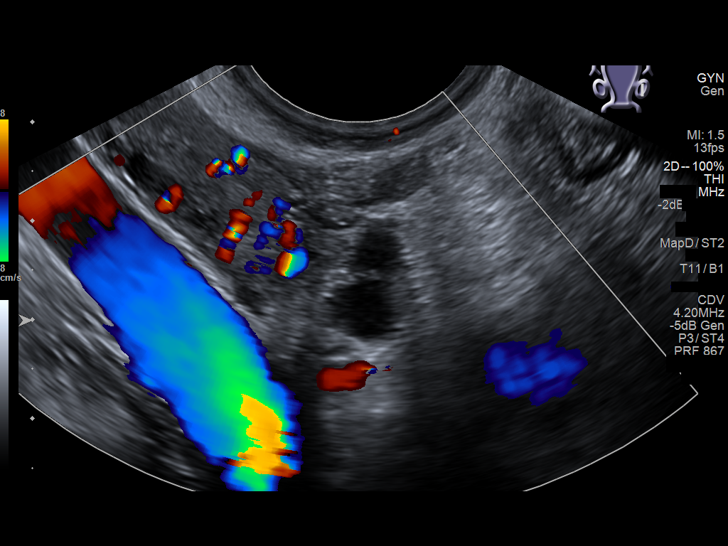
[im 110/110]
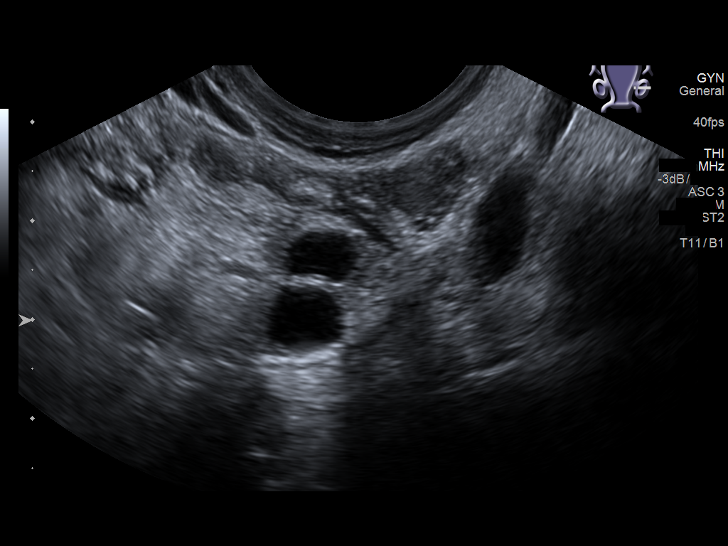

[13 of 25 positions shown; findings below may reference images not displayed]

FINDINGS: Uterus

Measurements: 8.9 x 3.5 x 4.8 cm = volume: 78 mL. No fibroids or
other mass visualized.

Endometrium

Thickness: 7 mm. Mobile echoes within the endometrial canal. No
focal abnormality visualized.

Right ovary

Measurements: 3.0 x 1.5 x 2.6 cm = volume: 6 mL. Normal
appearance/no adnexal mass.

Left ovary

Measurements: 3.7 x 1.8 x 2.4 cm = volume: 8 mL. Normal
appearance/no adnexal mass.

Other findings

Trace free fluid in the pelvis, likely physiologic.
IMPRESSION: 1. Normal endometrial thickness without focal abnormality. Mobile
echoes within the endometrial canal, consistent with active
bleeding. If bleeding remains unresponsive to hormonal or medical
therapy, sonohysterogram should be considered for focal lesion
work-up. (Ref: Radiological Reasoning: Algorithmic Workup of
Abnormal Vaginal Bleeding with Endovaginal Sonography and
Sonohysterography. AJR [id number]; 191:S68-73)

## 2019-06-19 NOTE — Progress Notes (Signed)
Patient here for pregnancy confirmation.  No complaints.  

## 2019-06-19 NOTE — Progress Notes (Signed)
Subjective:     Patient ID: Tina Hughes, female   DOB: 1991/02/21, 28 y.o.   MRN: [id number]  HPI  Here for pregnancy confirmation with LMP 05/22/2019, giving EGA 4w, & EDC 02/26/2020.   This is her 3rd pregnancy with 2 previous vaginal deliveries at term. Does have a history of postpartum depression/anxiety.  Also had gestational hypertension with first pregnancy.   Review of Systems  Constitutional: Positive for fatigue.  All other systems reviewed and are negative.      Objective:   Physical Exam A&Ox4 Well groomed female in no distress Vitals with BMI 06/19/2019 12/11/2018 12/10/2018  Height 5\' 6"  - -  Weight 140 lbs 2 oz - -  BMI 22.62 - -  Systolic 124 129 621  Diastolic 88 84 79  Pulse 74 82 71   UPT+     Assessment:     Missed menses    Plan:     Congratulated on planned pregnancy. Reviewed first trimester expectations and prenatal care. Will RTC in 3 weeks for viability scan & nurse intake/labs, then in 7 weeks for new ob PE with me.   Berania Peedin,CNM

## 2019-06-19 NOTE — Patient Instructions (Signed)
First Trimester of Pregnancy The first trimester of pregnancy is from week 1 until the end of week 13 (months 1 through 3). A week after a sperm fertilizes an egg, the egg will implant on the wall of the uterus. This embryo will begin to develop into a baby. Genes from you and your partner will form the baby. The female genes will determine whether the baby will be a boy or a girl. At 6-8 weeks, the eyes and face will be formed, and the heartbeat can be seen on ultrasound. At the end of 12 weeks, all the baby's organs will be formed. Now that you are pregnant, you will want to do everything you can to have a healthy baby. Two of the most important things are to get good prenatal care and to follow your health care provider's instructions. Prenatal care is all the medical care you receive before the baby's birth. This care will help prevent, find, and treat any problems during the pregnancy and childbirth. Body changes during your first trimester Your body goes through many changes during pregnancy. The changes vary from woman to woman.  You may gain or lose a couple of pounds at first.  You may feel sick to your stomach (nauseous) and you may throw up (vomit). If the vomiting is uncontrollable, call your health care provider.  You may tire easily.  You may develop headaches that can be relieved by medicines. All medicines should be approved by your health care provider.  You may urinate more often. Painful urination may mean you have a bladder infection.  You may develop heartburn as a result of your pregnancy.  You may develop constipation because certain hormones are causing the muscles that push stool through your intestines to slow down.  You may develop hemorrhoids or swollen veins (varicose veins).  Your breasts may begin to grow larger and become tender. Your nipples may stick out more, and the tissue that surrounds them (areola) may become darker.  Your gums may bleed and may be  sensitive to brushing and flossing.  Dark spots or blotches (chloasma, mask of pregnancy) may develop on your face. This will likely fade after the baby is born.  Your menstrual periods will stop.  You may have a loss of appetite.  You may develop cravings for certain kinds of food.  You may have changes in your emotions from day to day, such as being excited to be pregnant or being concerned that something may go wrong with the pregnancy and baby.  You may have more vivid and strange dreams.  You may have changes in your hair. These can include thickening of your hair, rapid growth, and changes in texture. Some women also have hair loss during or after pregnancy, or hair that feels dry or thin. Your hair will most likely return to normal after your baby is born. What to expect at prenatal visits During a routine prenatal visit:  You will be weighed to make sure you and the baby are growing normally.  Your blood pressure will be taken.  Your abdomen will be measured to track your baby's growth.  The fetal heartbeat will be listened to between weeks 10 and 14 of your pregnancy.  Test results from any previous visits will be discussed. Your health care provider may ask you:  How you are feeling.  If you are feeling the baby move.  If you have had any abnormal symptoms, such as leaking fluid, bleeding, severe headaches, or abdominal  cramping.  If you are using any tobacco products, including cigarettes, chewing tobacco, and electronic cigarettes.  If you have any questions. Other tests that may be performed during your first trimester include:  Blood tests to find your blood type and to check for the presence of any previous infections. The tests will also be used to check for low iron levels (anemia) and protein on red blood cells (Rh antibodies). Depending on your risk factors, or if you previously had diabetes during pregnancy, you may have tests to check for high blood sugar  that affects pregnant women (gestational diabetes).  Urine tests to check for infections, diabetes, or protein in the urine.  An ultrasound to confirm the proper growth and development of the baby.  Fetal screens for spinal cord problems (spina bifida) and Down syndrome.  HIV (human immunodeficiency virus) testing. Routine prenatal testing includes screening for HIV, unless you choose not to have this test.  You may need other tests to make sure you and the baby are doing well. Follow these instructions at home: Medicines  Follow your health care provider's instructions regarding medicine use. Specific medicines may be either safe or unsafe to take during pregnancy.  Take a prenatal vitamin that contains at least 600 micrograms (mcg) of folic acid.  If you develop constipation, try taking a stool softener if your health care provider approves. Eating and drinking   Eat a balanced diet that includes fresh fruits and vegetables, whole grains, good sources of protein such as meat, eggs, or tofu, and low-fat dairy. Your health care provider will help you determine the amount of weight gain that is right for you.  Avoid raw meat and uncooked cheese. These carry germs that can cause birth defects in the baby.  Eating four or five small meals rather than three large meals a day may help relieve nausea and vomiting. If you start to feel nauseous, eating a few soda crackers can be helpful. Drinking liquids between meals, instead of during meals, also seems to help ease nausea and vomiting.  Limit foods that are high in fat and processed sugars, such as fried and sweet foods.  To prevent constipation: ? Eat foods that are high in fiber, such as fresh fruits and vegetables, whole grains, and beans. ? Drink enough fluid to keep your urine clear or pale yellow. Activity  Exercise only as directed by your health care provider. Most women can continue their usual exercise routine during  pregnancy. Try to exercise for 30 minutes at least 5 days a week. Exercising will help you: ? Control your weight. ? Stay in shape. ? Be prepared for labor and delivery.  Experiencing pain or cramping in the lower abdomen or lower back is a good sign that you should stop exercising. Check with your health care provider before continuing with normal exercises.  Try to avoid standing for long periods of time. Move your legs often if you must stand in one place for a long time.  Avoid heavy lifting.  Wear low-heeled shoes and practice good posture.  You may continue to have sex unless your health care provider tells you not to. Relieving pain and discomfort  Wear a good support bra to relieve breast tenderness.  Take warm sitz baths to soothe any pain or discomfort caused by hemorrhoids. Use hemorrhoid cream if your health care provider approves.  Rest with your legs elevated if you have leg cramps or low back pain.  If you develop varicose veins in  your legs, wear support hose. Elevate your feet for 15 minutes, 3-4 times a day. Limit salt in your diet. Prenatal care  Schedule your prenatal visits by the twelfth week of pregnancy. They are usually scheduled monthly at first, then more often in the last 2 months before delivery.  Write down your questions. Take them to your prenatal visits.  Keep all your prenatal visits as told by your health care provider. This is important. Safety  Wear your seat belt at all times when driving.  Make a list of emergency phone numbers, including numbers for family, friends, the hospital, and police and fire departments. General instructions  Ask your health care provider for a referral to a local prenatal education class. Begin classes no later than the beginning of month 6 of your pregnancy.  Ask for help if you have counseling or nutritional needs during pregnancy. Your health care provider can offer advice or refer you to specialists for help  with various needs.  Do not use hot tubs, steam rooms, or saunas.  Do not douche or use tampons or scented sanitary pads.  Do not cross your legs for long periods of time.  Avoid cat litter boxes and soil used by cats. These carry germs that can cause birth defects in the baby and possibly loss of the fetus by miscarriage or stillbirth.  Avoid all smoking, herbs, alcohol, and medicines not prescribed by your health care provider. Chemicals in these products affect the formation and growth of the baby.  Do not use any products that contain nicotine or tobacco, such as cigarettes and e-cigarettes. If you need help quitting, ask your health care provider. You may receive counseling support and other resources to help you quit.  Schedule a dentist appointment. At home, brush your teeth with a soft toothbrush and be gentle when you floss. Contact a health care provider if:  You have dizziness.  You have mild pelvic cramps, pelvic pressure, or nagging pain in the abdominal area.  You have persistent nausea, vomiting, or diarrhea.  You have a bad smelling vaginal discharge.  You have pain when you urinate.  You notice increased swelling in your face, hands, legs, or ankles.  You are exposed to fifth disease or chickenpox.  You are exposed to [place] measles (rubella) and have never had it. Get help right away if:  You have a fever.  You are leaking fluid from your vagina.  You have spotting or bleeding from your vagina.  You have severe abdominal cramping or pain.  You have rapid weight gain or loss.  You vomit blood or material that looks like coffee grounds.  You develop a severe headache.  You have shortness of breath.  You have any kind of trauma, such as from a fall or a car accident. Summary  The first trimester of pregnancy is from week 1 until the end of week 13 (months 1 through 3).  Your body goes through many changes during pregnancy. The changes vary from  woman to woman.  You will have routine prenatal visits. During those visits, your health care provider will examine you, discuss any test results you may have, and talk with you about how you are feeling. This information is not intended to replace advice given to you by your health care provider. Make sure you discuss any questions you have with your health care provider. Document Released: 08/02/2001 Document Revised: 07/21/2017 Document Reviewed: 07/20/2016 Elsevier Patient Education  2020 [employer]. Common Medications Safe  in Pregnancy  Acne:      Constipation:  Benzoyl Peroxide     Colace  Clindamycin      Dulcolax Suppository  Topica Erythromycin     Fibercon  Salicylic Acid      Metamucil         Miralax AVOID:        Senakot   Accutane    Cough:  Retin-A       Cough Drops  Tetracycline      Phenergan w/ Codeine if Rx  Minocycline      Robitussin (Plain & DM)  Antibiotics:     Crabs/Lice:  Ceclor       RID  Cephalosporins    AVOID:  E-Mycins      Kwell  Keflex  Macrobid/Macrodantin   Diarrhea:  Penicillin      Kao-Pectate  Zithromax      Imodium AD         PUSH FLUIDS AVOID:       Cipro     Fever:  Tetracycline      Tylenol (Regular or Extra  Minocycline       Strength)  Levaquin      Extra Strength-Do not          Exceed 8 tabs/24 hrs Caffeine:        <251m/day (equiv. To 1 cup of coffee or  approx. 3 12 oz sodas)         Gas: Cold/Hayfever:       Gas-X  Benadryl      Mylicon  Claritin       Phazyme  **Claritin-D        Chlor-Trimeton    Headaches:  Dimetapp      ASA-Free Excedrin  Drixoral-Non-Drowsy     Cold Compress  Mucinex (Guaifenasin)     Tylenol (Regular or Extra  Sudafed/Sudafed-12 Hour     Strength)  **Sudafed PE Pseudoephedrine   Tylenol Cold & Sinus     Vicks Vapor Rub  Zyrtec  **AVOID if Problems With Blood Pressure         Heartburn: Avoid lying down for at least 1 hour after meals  Aciphex      Maalox     Rash:  Milk of  Magnesia     Benadryl    Mylanta       1% Hydrocortisone Cream  Pepcid  Pepcid Complete   Sleep Aids:  Prevacid      Ambien   Prilosec       Benadryl  Rolaids       Chamomile Tea  Tums (Limit 4/day)     Unisom  Zantac       Tylenol PM         Warm milk-add vanilla or  Hemorrhoids:       Sugar for taste  Anusol/Anusol H.C.  (RX: Analapram 2.5%)  Sugar Substitutes:  Hydrocortisone OTC     Ok in moderation  Preparation H      Tucks        Vaseline lotion applied to tissue with wiping    Herpes:     Throat:  Acyclovir      Oragel  Famvir  Valtrex     Vaccines:         Flu Shot Leg Cramps:       *Gardasil  Benadryl      Hepatitis A         Hepatitis B Nasal Spray:  Pneumovax  Saline Nasal Spray     Polio Booster         Tetanus Nausea:       Tuberculosis test or PPD  Vitamin B6 25 mg TID   AVOID:    Dramamine      *Gardasil  Emetrol       Live Poliovirus  Ginger Root 250 mg QID    MMR (measles, mumps &  High Complex Carbs @ Bedtime    rebella)  Sea Bands-Accupressure    Varicella (Chickenpox)  Unisom 1/2 tab TID     *No known complications           If received before Pain:         Known pregnancy;   Darvocet       Resume series after  Lortab        Delivery  Percocet    Yeast:   Tramadol      Femstat  Tylenol 3      Gyne-lotrimin  Ultram       Monistat  Vicodin           MISC:         All Sunscreens           Hair Coloring/highlights          Insect Repellant's          (Including DEET)         Mystic Tans Commonly Asked Questions During Pregnancy  Cats: A parasite can be excreted in cat feces.  To avoid exposure you need to have another person empty the little box.  If you must empty the litter box you will need to wear gloves.  Wash your hands after handling your cat.  This parasite can also be found in raw or undercooked meat so this should also be avoided.  Colds, Sore Throats, Flu: Please check your medication sheet to see what you can take for symptoms.   If your symptoms are unrelieved by these medications please call the office.  Dental Work: Most any dental work Investment banker, corporate recommends is permitted.  X-rays should only be taken during the first trimester if absolutely necessary.  Your abdomen should be shielded with a lead apron during all x-rays.  Please notify your provider prior to receiving any x-rays.  Novocaine is fine; gas is not recommended.  If your dentist requires a note from [place] prior to dental work please call the office and we will provide one for you.  Exercise: Exercise is an important part of staying healthy during your pregnancy.  You may continue most exercises you were accustomed to prior to pregnancy.  Later in your pregnancy you will most likely notice you have difficulty with activities requiring balance like riding a bicycle.  It is important that you listen to your body and avoid activities that put you at a higher risk of falling.  Adequate rest and staying well hydrated are a must!  If you have questions about the safety of specific activities ask your provider.    Exposure to Children with illness: Try to avoid obvious exposure; report any symptoms to [place] when noted,  If you have chicken pos, red measles or mumps, you should be immune to these diseases.   Please do not take any vaccines while pregnant unless you have checked with your OB provider.  Fetal Movement: After 28 weeks we recommend you do "kick counts" twice daily.  Lie or sit down in a calm  quiet environment and count your baby movements "kicks".  You should feel your baby at least 10 times per hour.  If you have not felt 10 kicks within the first hour get up, walk around and have something sweet to eat or drink then repeat for an additional hour.  If count remains less than 10 per hour notify your provider.  Fumigating: Follow your pest control agent's advice as to how long to stay out of your home.  Ventilate the area well before re-entering.  Hemorrhoids:   Most  over-the-counter preparations can be used during pregnancy.  Check your medication to see what is safe to use.  It is important to use a stool softener or fiber in your diet and to drink lots of liquids.  If hemorrhoids seem to be getting worse please call the office.   Hot Tubs:  Hot tubs Jacuzzis and saunas are not recommended while pregnant.  These increase your internal body temperature and should be avoided.  Intercourse:  Sexual intercourse is safe during pregnancy as long as you are comfortable, unless otherwise advised by your provider.  Spotting may occur after intercourse; report any bright red bleeding that is heavier than spotting.  Labor:  If you know that you are in labor, please go to the hospital.  If you are unsure, please call the office and let us help you decide what to do.  Lifting, straining, etc:  If your job requires heavy lifting or straining please check with your provider for any limitations.  Generally, you should not lift items heavier than that you can lift simply with your hands and arms (no back muscles)  Painting:  Paint fumes do not harm your pregnancy, but may make you ill and should be avoided if possible.  Latex or water based paints have less odor than oils.  Use adequate ventilation while painting.  Permanents & Hair Color:  Chemicals in hair dyes are not recommended as they cause increase hair dryness which can increase hair loss during pregnancy.  " Highlighting" and permanents are allowed.  Dye may be absorbed differently and permanents may not hold as well during pregnancy.  Sunbathing:  Use a sunscreen, as skin burns easily during pregnancy.  Drink plenty of fluids; avoid over heating.  Tanning Beds:  Because their possible side effects are still unknown, tanning beds are not recommended.  Ultrasound Scans:  Routine ultrasounds are performed at approximately 20 weeks.  You will be able to see your baby's general anatomy an if you would like to know the  gender this can usually be determined as well.  If it is questionable when you conceived you may also receive an ultrasound early in your pregnancy for dating purposes.  Otherwise ultrasound exams are not routinely performed unless there is a medical necessity.  Although you can request a scan we ask that you pay for it when conducted because insurance does not cover " patient request" scans.  Work: If your pregnancy proceeds without complications you may work until your due date, unless your physician or employer advises otherwise.  Round Ligament Pain/Pelvic Discomfort:  Sharp, shooting pains not associated with bleeding are fairly common, usually occurring in the second trimester of pregnancy.  They tend to be worse when standing up or when you remain standing for long periods of time.  These are the result of pressure of certain pelvic ligaments called "round ligaments".  Rest, Tylenol and heat seem to be the most effective relief.  As the  womb and fetus grow, they rise out of the pelvis and the discomfort improves.  Please notify the office if your pain seems different than that described.  It may represent a more serious condition.

## 2019-07-01 ENCOUNTER — Telehealth: Payer: Self-pay

## 2019-07-01 MED ORDER — DOXYLAMINE-PYRIDOXINE 10-10 MG PO TBEC: 2.0000 | DELAYED_RELEASE_TABLET | Freq: Every day | ORAL | 5 refills | Status: DC

## 2019-07-01 NOTE — Telephone Encounter (Signed)
diclegis sent to [facility] in [place] per patient request for something for N/V. Patient informed via mychart.

## 2019-07-11 ENCOUNTER — Other Ambulatory Visit: Payer: Self-pay

## 2019-07-11 ENCOUNTER — Ambulatory Visit (INDEPENDENT_AMBULATORY_CARE_PROVIDER_SITE_OTHER): Payer: Medicaid Other

## 2019-07-11 ENCOUNTER — Ambulatory Visit: Payer: Medicaid Other | Admitting: Certified Nurse Midwife

## 2019-07-11 VITALS — BP 107/74 | HR 68 | Wt 142.1 lb

## 2019-07-11 DIAGNOSIS — N8311 Corpus luteum cyst of right ovary: Secondary | ICD-10-CM | POA: Diagnosis not present

## 2019-07-11 DIAGNOSIS — N912 Amenorrhea, unspecified: Secondary | ICD-10-CM

## 2019-07-11 DIAGNOSIS — Z3481 Encounter for supervision of other normal pregnancy, first trimester: Secondary | ICD-10-CM | POA: Diagnosis not present

## 2019-07-11 DIAGNOSIS — Z3A01 Less than 8 weeks gestation of pregnancy: Secondary | ICD-10-CM | POA: Diagnosis not present

## 2019-07-11 DIAGNOSIS — O3481 Maternal care for other abnormalities of pelvic organs, first trimester: Secondary | ICD-10-CM

## 2019-07-11 NOTE — Progress Notes (Signed)
Tina Hughes presents for NOB nurse interview visit. Pregnancy confirmation done 06/19/19 MS.  G-3 .  P-2 0 0 2    . Pregnancy education material explained and given. 1 cats in the home. NOB labs ordered. HIV labs and Drug screen were explained optional and she did not decline. Drug screen ordered. PNV encouraged. Genetic screening options discussed. Genetic testing: Ordered/Declined/Unsure.  Pt may discuss with provider. Pt. To follow up with provider in _3_ weeks for NOB physical.  All questions answered. FMLA paper signed and the financial policed was reviewed.

## 2019-07-12 LAB — MONITOR DRUG PROFILE 14(MW)
Amphetamine Scrn, Ur: NEGATIVE ng/mL
BARBITURATE SCREEN URINE: NEGATIVE ng/mL
BENZODIAZEPINE SCREEN, URINE: NEGATIVE ng/mL
Buprenorphine, Urine: NEGATIVE ng/mL
CANNABINOIDS UR QL SCN: NEGATIVE ng/mL
Cocaine (Metab) Scrn, Ur: NEGATIVE ng/mL
Creatinine(Crt), U: 39.7 mg/dL (ref 20.0–300.0)
Fentanyl, Urine: NEGATIVE pg/mL
Meperidine Screen, Urine: NEGATIVE ng/mL
Methadone Screen, Urine: NEGATIVE ng/mL
OXYCODONE+OXYMORPHONE UR QL SCN: NEGATIVE ng/mL
Opiate Scrn, Ur: NEGATIVE ng/mL
Ph of Urine: 6.1 (ref 4.5–8.9)
Phencyclidine Qn, Ur: NEGATIVE ng/mL
Propoxyphene Scrn, Ur: NEGATIVE ng/mL
SPECIFIC GRAVITY: 1.01
Tramadol Screen, Urine: NEGATIVE ng/mL

## 2019-07-12 LAB — URINALYSIS, ROUTINE W REFLEX MICROSCOPIC
Bilirubin, UA: NEGATIVE
Glucose, UA: NEGATIVE
Ketones, UA: NEGATIVE
Leukocytes,UA: NEGATIVE
Nitrite, UA: NEGATIVE
Protein,UA: NEGATIVE
RBC, UA: NEGATIVE
Specific Gravity, UA: 1.01 (ref 1.005–1.030)
Urobilinogen, Ur: 0.2 mg/dL (ref 0.2–1.0)
pH, UA: 6.5 (ref 5.0–7.5)

## 2019-07-13 LAB — ABO AND RH: Rh Factor: NEGATIVE

## 2019-07-13 LAB — HIV ANTIBODY (ROUTINE TESTING W REFLEX): HIV Screen 4th Generation wRfx: NONREACTIVE

## 2019-07-13 LAB — URINE CULTURE: Organism ID, Bacteria: NO GROWTH

## 2019-07-13 LAB — ANTIBODY SCREEN: Antibody Screen: NEGATIVE

## 2019-07-13 LAB — HEPATITIS B SURFACE ANTIGEN: Hepatitis B Surface Ag: NEGATIVE

## 2019-07-13 LAB — VARICELLA ZOSTER ANTIBODY, IGG: Varicella zoster IgG: 917 index (ref >165)

## 2019-07-13 LAB — RUBELLA SCREEN: Rubella Antibodies, IGG: 2.74 index (ref >0.99)

## 2019-07-13 LAB — RPR: RPR Ser Ql: NONREACTIVE

## 2019-07-15 LAB — GC/CHLAMYDIA PROBE AMP
Chlamydia trachomatis, NAA: NEGATIVE
Neisseria Gonorrhoeae by PCR: NEGATIVE

## 2019-08-01 ENCOUNTER — Telehealth: Payer: Self-pay

## 2019-08-01 NOTE — Telephone Encounter (Signed)
mychart message sent

## 2019-08-01 NOTE — Telephone Encounter (Signed)
-----   Message from Philip Aspen, [place] sent at 08/01/2019  9:54 AM EST ----- This pt is scheduled for NOB she wont even be 11 wks tomorrow. She will be 10.6- I will not be able to hear heart tones which means she will have to come back in 2 wks for fetal heart tones?? This may need to be pushed back?   Thanks Deneise Lever

## 2019-08-01 NOTE — Telephone Encounter (Signed)
Patient was unaware her appt was rescheduled. Wanted to at least a have lab completed to determine the gender of her baby?

## 2019-08-02 ENCOUNTER — Other Ambulatory Visit: Payer: Self-pay

## 2019-08-02 ENCOUNTER — Encounter: Payer: Medicaid Other | Admitting: Certified Nurse Midwife

## 2019-08-02 ENCOUNTER — Other Ambulatory Visit: Payer: Medicaid Other

## 2019-08-02 DIAGNOSIS — Z3481 Encounter for supervision of other normal pregnancy, first trimester: Secondary | ICD-10-CM

## 2019-08-03 LAB — CBC
Hematocrit: 37.9 % (ref 34.0–46.6)
Hemoglobin: 12.8 g/dL (ref 11.1–15.9)
MCH: 28.4 pg (ref 26.6–33.0)
MCHC: 33.8 g/dL (ref 31.5–35.7)
MCV: 84 fL (ref 79–97)
Platelets: 229 10*3/uL (ref 150–450)
RBC: 4.5 x10E6/uL (ref 3.77–5.28)
RDW: 13.8 % (ref 11.7–15.4)
WBC: 7.9 10*3/uL (ref 3.4–10.8)

## 2019-08-07 ENCOUNTER — Encounter: Payer: Self-pay | Admitting: Obstetrics and Gynecology

## 2019-08-09 ENCOUNTER — Encounter: Payer: Self-pay | Admitting: Certified Nurse Midwife

## 2019-08-12 ENCOUNTER — Telehealth: Payer: Self-pay

## 2019-08-12 NOTE — Telephone Encounter (Signed)
mychart message sent

## 2019-08-12 NOTE — Telephone Encounter (Signed)
Phone call placed to patient informed her she is having a boy!!

## 2019-08-19 ENCOUNTER — Ambulatory Visit (INDEPENDENT_AMBULATORY_CARE_PROVIDER_SITE_OTHER): Payer: Medicaid Other | Admitting: Certified Nurse Midwife

## 2019-08-19 ENCOUNTER — Encounter: Payer: Self-pay | Admitting: Certified Nurse Midwife

## 2019-08-19 ENCOUNTER — Other Ambulatory Visit: Payer: Self-pay

## 2019-08-19 VITALS — BP 121/73 | HR 74 | Wt 145.5 lb

## 2019-08-19 DIAGNOSIS — Z3481 Encounter for supervision of other normal pregnancy, first trimester: Secondary | ICD-10-CM

## 2019-08-19 DIAGNOSIS — Z3A13 13 weeks gestation of pregnancy: Secondary | ICD-10-CM | POA: Diagnosis not present

## 2019-08-19 LAB — POCT URINALYSIS DIPSTICK OB
Bilirubin, UA: NEGATIVE
Blood, UA: NEGATIVE
Glucose, UA: NEGATIVE
Ketones, UA: NEGATIVE
Leukocytes, UA: NEGATIVE
Nitrite, UA: NEGATIVE
POC,PROTEIN,UA: NEGATIVE
Spec Grav, UA: 1.02 (ref 1.010–1.025)
Urobilinogen, UA: 0.2 E.U./dL
pH, UA: 5 (ref 5.0–8.0)

## 2019-08-19 NOTE — Progress Notes (Signed)
NEW OB HISTORY AND PHYSICAL  SUBJECTIVE:       Tina Hughes is a 28 y.o. G10P2002 female, Patient's last menstrual period was 05/22/2019 (exact date)., Estimated Date of Delivery: 02/22/20, [redacted]w[redacted]d, presents today for establishment of Prenatal Care. She has no unusual complaints   Gynecologic History Patient's last menstrual period was 05/22/2019 (exact date). Normal Contraception: none Last Pap: 05/25/18. Results were: normal  Obstetric History OB History  Gravida Para Term Preterm AB Living  3 2 2     2   SAB TAB Ectopic Multiple Live Births        0 2    # Outcome Date GA Lbr Len/2nd Weight Sex Delivery Anes PTL Lv  3 Current           2 Term 10/14/17 [redacted]w[redacted]d 03:14 / 00:26 7 lb 13.6 oz (3.56 kg) F Vag-Spont EPI N LIV  1 Term 11/09/14   7 lb 7 oz (3.374 kg) M Vag-Spont EPI N LIV    Past Medical History:  Diagnosis Date  . Anxiety   . Vegetarian     No past surgical history on file.  Current Outpatient Medications on File Prior to Visit  Medication Sig Dispense Refill  . prenatal vitamin w/FE, FA (PRENATAL 1 + 1) 27-1 MG TABS tablet Take 1 tablet by mouth daily at 12 noon.    . Doxylamine-Pyridoxine (DICLEGIS) 10-10 MG TBEC Take 2 tablets by mouth at bedtime. If symptoms persist, add one tablet in the morning and one in the afternoon (Patient not taking: Reported on 08/19/2019) 100 tablet 5   No current facility-administered medications on file prior to visit.    No Known Allergies  Social History   Socioeconomic History  . Marital status: Single    Spouse name: Not on file  . Number of children: Not on file  . Years of education: Not on file  . Highest education level: Not on file  Occupational History  . Not on file  Tobacco Use  . Smoking status: Former Research scientist (life sciences)  . Smokeless tobacco: Never Used  Substance and Sexual Activity  . Alcohol use: Not Currently    Comment: occasional  . Drug use: No  . Sexual activity: Yes    Birth control/protection: None  Other  Topics Concern  . Not on file  Social History Narrative  . Not on file   Social Determinants of Health   Financial Resource Strain:   . Difficulty of Paying Living Expenses: Not on file  Food Insecurity:   . Worried About Charity fundraiser in the Last Year: Not on file  . Ran Out of Food in the Last Year: Not on file  Transportation Needs:   . Lack of Transportation (Medical): Not on file  . Lack of Transportation (Non-Medical): Not on file  Physical Activity:   . Days of Exercise per Week: Not on file  . Minutes of Exercise per Session: Not on file  Stress:   . Feeling of Stress : Not on file  Social Connections:   . Frequency of Communication with Friends and Family: Not on file  . Frequency of Social Gatherings with Friends and Family: Not on file  . Attends Religious Services: Not on file  . Active Member of Clubs or Organizations: Not on file  . Attends Archivist Meetings: Not on file  . Marital Status: Not on file  Intimate Partner Violence:   . Fear of Current or Ex-Partner: Not on file  .  Emotionally Abused: Not on file  . Physically Abused: Not on file  . Sexually Abused: Not on file    Family History  Problem Relation Age of Onset  . Cancer Paternal Grandmother        skin cancer  . Irritable bowel syndrome Mother   . Breast cancer Neg Hx   . Ovarian cancer Neg Hx   . Colon cancer Neg Hx     The following portions of the patient's history were reviewed and updated as appropriate: allergies, current medications, past OB history, past medical history, past surgical history, past family history, past social history, and problem list.    OBJECTIVE: Initial Physical Exam (New OB)  GENERAL APPEARANCE: alert, well appearing, in no apparent distress, oriented to person, place and time HEAD: normocephalic, atraumatic MOUTH: mucous membranes moist, pharynx normal without lesions THYROID: no thyromegaly or masses present BREASTS: no masses noted, no  significant tenderness, no palpable axillary nodes, no skin changes LUNGS: clear to auscultation, no wheezes, rales or rhonchi, symmetric air entry HEART: regular rate and rhythm, no murmurs ABDOMEN: soft, nontender, nondistended, no abnormal masses, no epigastric pain and FHT present EXTREMITIES: no redness or tenderness in the calves or thighs, no edema, no limitation in range of motion, intact peripheral pulses SKIN: normal coloration and turgor, no rashes LYMPH NODES: no adenopathy palpable NEUROLOGIC: alert, oriented, normal speech, no focal findings or movement disorder noted  PELVIC EXAM EXTERNAL GENITALIA: normal appearing vulva with no masses, tenderness or lesions VAGINA: no abnormal discharge or lesions CERVIX: no lesions or cervical motion tenderness UTERUS: gravid ADNEXA: no masses palpable and nontender OB EXAM PELVIMETRY: appears adequate RECTUM: exam not indicated  ASSESSMENT: Normal pregnancy  PLAN: Prenatal care See ordersNew OB counseling: The patient has been given an overview regarding routine prenatal care. Recommendations regarding diet, weight gain, and exercise in pregnancy were given. Prenatal testing, optional genetic testing, carrier screening test, and ultrasound use in pregnancy were reviewed. Genetic screening completed. Low risk , female. Benefits of Breast Feeding were discussed. The patient is encouraged to consider nursing her baby post partum. Follow up 3 wks.   Philip Aspen, CNM

## 2019-08-19 NOTE — Patient Instructions (Signed)

## 2019-08-23 NOTE — L&D Delivery Note (Signed)
Delivery Note  0457 In room to seep patient, bulging bag of water visible on perineum. Effective coached maternal pushing efforts.   Spontaneous vaginal birth of liveborn female infant over intact perineum in left occiput anterior position with spontaneous rupture of membranes at [place]. Infant immediately to maternal abdomen. Sac cleared from face, skin to skin, three (3) vessel cord, and tub of cord blood collected. APGARs 8, 9. Weight: 9 pounds 1 ounce. Receiving nurse present at bedside for birth.   Pitocin infusing. Spontaneous delivery of intact placenta at 0510. Bilateral symmetric labial lacerations hemostatic, right side repaired with 3-0 rapide to promote bilateral healing. Anesthesia: epidural. EBL: 350 ml. Uterus firm. Lochia: small. Vault check completed. Counts correct x 2.   Initiate routine postpartum care and orders. Mom to postpartum.  Baby to Couplet care / Skin to Skin.  FOB present and bedside and assisted CNM hand in hand with birth of [place].    Dani Gobble, CNM [facility], [facility] 02/22/2020, 5:31 AM

## 2019-09-09 ENCOUNTER — Ambulatory Visit (INDEPENDENT_AMBULATORY_CARE_PROVIDER_SITE_OTHER): Payer: Medicaid Other | Admitting: Certified Nurse Midwife

## 2019-09-09 ENCOUNTER — Other Ambulatory Visit: Payer: Self-pay

## 2019-09-09 ENCOUNTER — Encounter: Payer: Self-pay | Admitting: Certified Nurse Midwife

## 2019-09-09 VITALS — BP 125/70 | HR 80 | Wt 147.0 lb

## 2019-09-09 DIAGNOSIS — Z23 Encounter for immunization: Secondary | ICD-10-CM | POA: Diagnosis not present

## 2019-09-09 DIAGNOSIS — Z3481 Encounter for supervision of other normal pregnancy, first trimester: Secondary | ICD-10-CM

## 2019-09-09 LAB — POCT URINALYSIS DIPSTICK OB
Bilirubin, UA: NEGATIVE
Blood, UA: NEGATIVE
Glucose, UA: NEGATIVE
Ketones, UA: NEGATIVE
Leukocytes, UA: NEGATIVE
Nitrite, UA: NEGATIVE
POC,PROTEIN,UA: NEGATIVE
Spec Grav, UA: 1.02 (ref 1.010–1.025)
Urobilinogen, UA: 0.2 E.U./dL
pH, UA: 5 (ref 5.0–8.0)

## 2019-09-09 NOTE — Patient Instructions (Signed)

## 2019-09-09 NOTE — Progress Notes (Signed)
ROB doing well. Feels flutters , discussed u/s next visit. Follow up 4 wks.   Philip Aspen, CNM

## 2019-09-26 ENCOUNTER — Telehealth: Payer: Self-pay | Admitting: Physician Assistant

## 2019-09-26 DIAGNOSIS — N898 Other specified noninflammatory disorders of vagina: Secondary | ICD-10-CM

## 2019-09-26 NOTE — Progress Notes (Signed)
Based on what you shared with me, I feel your condition warrants further evaluation and I recommend that you be seen for a face to face visit.  Because you are pregnant I want you to have the safest and best care.  Please contact your OB/GYN to be seen concerning this. Many offices offer virtual options to be seen via video if you are not comfortable going in person to a medical facility at this time.  If you do not have a PCP, [facility] offers a free physician referral service available at [phone]. Our trained staff has the experience, knowledge and resources to put you in touch with a physician who is right for you.   You also have the option of a video visit through [email or web address]  If you are having a true medical emergency please call 911.  NOTE: If you entered your credit card information for this eVisit, you will not be charged. You may see a "hold" on your card for the $35 but that hold will drop off and you will not have a charge processed.  Your e-visit answers were reviewed by a board certified advanced clinical practitioner to complete your personal care plan.  Thank you for using e-Visits.  Particia Nearing PA-C

## 2019-09-27 ENCOUNTER — Other Ambulatory Visit: Payer: Self-pay | Admitting: Certified Nurse Midwife

## 2019-09-27 ENCOUNTER — Telehealth: Payer: Self-pay | Admitting: Certified Nurse Midwife

## 2019-09-27 MED ORDER — METRONIDAZOLE 500 MG PO TABS: 500.0000 mg | ORAL_TABLET | Freq: Two times a day (BID) | ORAL | 0 refills | Status: AC

## 2019-09-27 NOTE — Telephone Encounter (Signed)
Pt called in and stated that she has been messaging annie on my chart and that she informed her to come in and get tested for BV. The pt was wanting to be seen today, annie  Informed the pt to try  over the counter meds.

## 2019-09-27 NOTE — Progress Notes (Signed)
Pt has malodorous d/c and has hx of BV in past. Orders placed for treatment.   Philip Aspen, CNM

## 2019-10-09 ENCOUNTER — Encounter: Payer: Self-pay | Admitting: Certified Nurse Midwife

## 2019-10-09 ENCOUNTER — Ambulatory Visit (INDEPENDENT_AMBULATORY_CARE_PROVIDER_SITE_OTHER): Payer: Medicaid Other

## 2019-10-09 ENCOUNTER — Other Ambulatory Visit: Payer: Self-pay

## 2019-10-09 ENCOUNTER — Ambulatory Visit (INDEPENDENT_AMBULATORY_CARE_PROVIDER_SITE_OTHER): Payer: Medicaid Other | Admitting: Certified Nurse Midwife

## 2019-10-09 VITALS — BP 128/68 | HR 94 | Wt 150.2 lb

## 2019-10-09 DIAGNOSIS — Z3481 Encounter for supervision of other normal pregnancy, first trimester: Secondary | ICD-10-CM | POA: Diagnosis not present

## 2019-10-09 LAB — POCT URINALYSIS DIPSTICK OB
Bilirubin, UA: NEGATIVE
Blood, UA: NEGATIVE
Glucose, UA: NEGATIVE
Ketones, UA: NEGATIVE
Leukocytes, UA: NEGATIVE
Nitrite, UA: NEGATIVE
POC,PROTEIN,UA: NEGATIVE
Spec Grav, UA: 1.015 (ref 1.010–1.025)
Urobilinogen, UA: 0.2 E.U./dL
pH, UA: 5 (ref 5.0–8.0)

## 2019-10-09 NOTE — Patient Instructions (Signed)

## 2019-10-09 NOTE — Progress Notes (Signed)
ROB doing well, Anatomy u/s today. Results reviewed (see below). Round ligament pain reviewed. Self help measures discussed. Follow up 4 wk for ROB with Sharyn Lull.   Philip Aspen, CNM   Patient Name: Tina Hughes DOB: Dec 23, 1990 MRN: [id number] ULTRASOUND REPORT  Location: [facility] Date of Service: 10/09/2019   Indications:Anatomy Ultrasound Findings:  Nelda [place] intrauterine pregnancy is visualized with FHR at 146 BPM. Biometrics give an (U/S) Gestational age of [redacted]w[redacted]d and an (U/S) EDD of 02/22/2020; this correlates with the clinically established Estimated Date of Delivery: 02/22/20  Fetal presentation is Variable.  EFW: 370 g ( 13 oz).  Placenta: posterior. Grade: 1 AFI: subjectively normal.  Anatomic survey is complete and normal; Gender - female.    Right Ovary is normal in appearance. Left Ovary is normal appearance. Survey of the adnexa demonstrates no adnexal masses. There is no free peritoneal fluid in the cul de sac.  Impression: 1. [redacted]w[redacted]d Viable Singleton Intrauterine pregnancy by U/S. 2. (U/S) EDD is consistent with Clinically established Estimated Date of Delivery: 02/22/20 . 3. Normal Anatomy Scan  Recommendations: 1.Clinical correlation with the patient's History and Physical Exam.   Jenine M. Albertine Grates    RDMS

## 2019-11-08 ENCOUNTER — Ambulatory Visit (INDEPENDENT_AMBULATORY_CARE_PROVIDER_SITE_OTHER): Payer: Medicaid Other | Admitting: Certified Nurse Midwife

## 2019-11-08 ENCOUNTER — Other Ambulatory Visit: Payer: Self-pay

## 2019-11-08 VITALS — BP 108/71 | HR 71 | Wt 155.3 lb

## 2019-11-08 DIAGNOSIS — Z3A24 24 weeks gestation of pregnancy: Secondary | ICD-10-CM | POA: Diagnosis not present

## 2019-11-08 LAB — POCT URINALYSIS DIPSTICK OB
Bilirubin, UA: NEGATIVE
Blood, UA: NEGATIVE
Glucose, UA: NEGATIVE
Ketones, UA: NEGATIVE
Leukocytes, UA: NEGATIVE
Nitrite, UA: NEGATIVE
POC,PROTEIN,UA: NEGATIVE
Spec Grav, UA: 1.01 (ref 1.010–1.025)
Urobilinogen, UA: 0.2 E.U./dL
pH, UA: 7 (ref 5.0–8.0)

## 2019-11-08 NOTE — Patient Instructions (Signed)
Healthy Weight Gain During Pregnancy, Adult A certain amount of weight gain during pregnancy is normal and healthy. How much weight you should gain depends on your overall health and a measurement called BMI (body mass index). BMI is an estimate of your body fat based on your height and weight. You can use an online calculator to figure out your BMI, or you can ask your health care provider to calculate it for you at your next visit. Your recommended pregnancy weight gain is based on your pre-pregnancy BMI. General guidelines for a healthy total weight gain during pregnancy are listed below. If your BMI at or before the start of your pregnancy is:  Less than 18.5 (underweight), you should gain 28-40 lb (13-18 kg).  18.5-24.9 (normal weight), you should gain 25-35 lb (11-16 kg).  25-29.9 (overweight), you should gain 15-25 lb (7-11 kg).  30 or higher (obese), you should gain 11-20 lb (5-9 kg). These ranges vary depending on your individual health. If you are carrying more than one baby (multiples), it may be safe to gain more weight than these recommendations. If you gain less weight than recommended, that may be safe as long as your baby is growing and developing normally. How can unhealthy weight gain affect me and my baby? Gaining too much weight during pregnancy can lead to pregnancy complications, such as:  A temporary form of diabetes that develops during pregnancy (gestational diabetes).  High blood pressure during pregnancy and protein in your urine (preeclampsia).  High blood pressure during pregnancy without protein in your urine (gestational hypertension).  Your baby having a high weight at birth, which may: ? Raise your risk of having a more difficult delivery or a surgical delivery (cesarean delivery, or C-section). ? Raise your child's risk of developing obesity during childhood. Not gaining enough weight can be life-threatening for your baby, and it may raise your baby's chances  of:  Being born early (preterm).  Growing more slowly than normal during pregnancy (growth restriction).  Having a low weight at birth. What actions can I take to gain a healthy amount of weight during pregnancy? General instructions  Keep track of your weight gain during pregnancy.  Take over-the-counter and prescription medicines only as told by your health care provider. Take all prenatal supplements as directed.  Keep all health care visits during pregnancy (prenatal visits). These visits are a good time to discuss your weight gain. Your health care provider will weigh you at each visit to make sure you are gaining a healthy amount of weight. Nutrition   Eat a balanced, nutrient-rich diet. Eat plenty of: ? Fruits and vegetables, such as berries and broccoli. ? Whole grains, such as millet, barley, whole-wheat breads and cereals, and oatmeal. ? Low-fat dairy products or non-dairy products such as almond milk or rice milk. ? Protein foods, such as lean meat, chicken, eggs, and legumes (such as peas, beans, soybeans, and lentils).  Avoid foods that are fried or have a lot of fat, salt (sodium), or sugar.  Drink enough fluid to keep your urine pale yellow.  Choose healthy snack and drink options when you are at work or on the go: ? Drink water. Avoid soda, sports drinks, and juices that have added sugar. ? Avoid drinks with caffeine, such as coffee and energy drinks. ? Eat snacks that are high in protein, such as nuts, protein bars, and low-fat yogurt. ? Carry convenient snacks in your purse that do not need refrigeration, such as a pack of   trail mix, an apple, or a granola bar.  If you need help improving your diet, work with a health care provider or a diet and nutrition specialist (dietitian). Activity   Exercise regularly, as told by your health care provider. ? If you were active before becoming pregnant, you may be able to continue your regular fitness activities. ? If  you were not active before pregnancy, you may gradually build up to exercising for 30 or more minutes on most days of the week. This may include walking, swimming, or yoga.  Ask your health care provider what activities are safe for you. Talk with your health care provider about whether you may need to be excused from certain school or work activities. Where to find more information Learn more about managing your weight gain during pregnancy from:  American Pregnancy Association: www.americanpregnancy.org  [place] Department of Agriculture pregnancy weight gain calculator: [employer] Summary  Too much weight gain during pregnancy can lead to complications for you and your baby.  Find out your pre-pregnancy BMI to determine how much weight gain is healthy for you.  Eat nutritious foods and stay active.  Keep all of your prenatal visits as told by your health care provider. This information is not intended to replace advice given to you by your health care provider. Make sure you discuss any questions you have with your health care provider. Document Revised: 05/01/2019 Document Reviewed: 04/28/2017 Elsevier Patient Education  [place].   Exercise During Pregnancy Exercise is an important part of being healthy for people of all ages. Exercise improves the function of your heart and lungs and helps you maintain strength, flexibility, and a healthy body weight. Exercise also boosts energy levels and elevates mood. Most women should exercise regularly during pregnancy. In rare cases, women with certain medical conditions or complications may be asked to limit or avoid exercise during pregnancy. How does this affect me? Along with maintaining general strength and flexibility, exercising during pregnancy can help:  Keep strength in muscles that are used during labor and childbirth.  Decrease low back pain.  Reduce symptoms of depression.  Control weight gain during  pregnancy.  Reduce the risk of needing insulin if you develop diabetes during pregnancy.  Decrease the risk of cesarean delivery.  Speed up your recovery after giving birth. How does this affect my baby? Exercise can help you have a healthy pregnancy. Exercise does not cause premature birth. It will not cause your baby to weigh less at birth. What exercises can I do? Many exercises are safe for you to do during pregnancy. Do a variety of exercises that safely increase your heart and breathing rates and help you build and maintain muscle strength. Do exercises exactly as told by your health care provider. You may do these exercises:  Walking or hiking.  Swimming.  Water aerobics.  Riding a stationary bike.  Strength training.  Modified yoga or Pilates. Tell your instructor that you are pregnant. Avoid overstretching, and avoid lying on your back for long periods of time.  Running or jogging. Only choose this type of exercise if you: ? Ran or jogged regularly before your pregnancy. ? Can run or jog and still talk in complete sentences. What exercises should I avoid? Depending on your level of fitness and whether you exercised regularly before your pregnancy, you may be told to limit high-intensity exercise. You can tell that you are exercising at a high intensity if you are breathing much harder and faster and  cannot hold a conversation while exercising. You must avoid:  Contact sports.  Activities that put you at risk for falling on or being hit in the belly, such as downhill skiing, water skiing, surfing, rock climbing, cycling, gymnastics, and horseback riding.  Scuba diving.  Skydiving.  Yoga or Pilates in a room that is heated to high temperatures.  Jogging or running, unless you ran or jogged regularly before your pregnancy. While jogging or running, you should always be able to talk in full sentences. Do not run or jog so fast that you are unable to have a  conversation.  Do not exercise at more than 6,000 feet above sea level (high elevation) if you are not used to exercising at high elevation. How do I exercise in a safe way?   Avoid overheating. Do not exercise in very high temperatures.  Wear loose-fitting, breathable clothes.  Avoid dehydration. Drink enough water before, during, and after exercise to keep your urine pale yellow.  Avoid overstretching. Because of hormone changes during pregnancy, it is easy to overstretch muscles, tendons, and ligaments during pregnancy.  Start slowly and ask your health care provider to recommend the types of exercise that are safe for you.  Do not exercise to lose weight. Follow these instructions at home:  Exercise on most days or all days of the week. Try to exercise for 30 minutes a day, 5 days a week, unless your health care provider tells you not to.  If you actively exercised before your pregnancy and you are healthy, your health care provider may tell you to continue to do moderate to high-intensity exercise.  If you are just starting to exercise or did not exercise much before your pregnancy, your health care provider may tell you to do low to moderate-intensity exercise. Questions to ask your health care provider  Is exercise safe for me?  What are signs that I should stop exercising?  Does my health condition mean that I should not exercise during pregnancy?  When should I avoid exercising during pregnancy? Stop exercising and contact a health care provider if: You have any unusual symptoms, such as:  Mild contractions of the uterus or cramps in the abdomen.  Dizziness that does not go away when you rest. Stop exercising and get help right away if: You have any unusual symptoms, such as:  Sudden, severe pain in your low back or your belly.  Mild contractions of the uterus or cramps in the abdomen that do not improve with rest and drinking fluids.  Chest pain.  Bleeding or  fluid leaking from your vagina.  Shortness of breath. These symptoms may represent a serious problem that is an emergency. Do not wait to see if the symptoms will go away. Get medical help right away. Call your local emergency services (911 in the [place] Do not drive yourself to the hospital. Summary  Most women should exercise regularly throughout pregnancy. In rare cases, women with certain medical conditions or complications may be asked to limit or avoid exercise during pregnancy.  Do not exercise to lose weight during pregnancy.  Your health care provider will tell you what level of physical activity is right for you.  Stop exercising and contact a health care provider if you have mild contractions of the uterus or cramps in the abdomen. Get help right away if these contractions or cramps do not improve with rest and drinking fluids.  Stop exercising and get help right away if you have sudden, severe  pain in your low back or belly, chest pain, shortness of breath, or bleeding or leaking of fluid from your vagina. This information is not intended to replace advice given to you by your health care provider. Make sure you discuss any questions you have with your health care provider. Document Revised: 11/29/2018 Document Reviewed: 09/12/2018 Elsevier Patient Education  [place].   Common Medications Safe in Pregnancy  Acne:      Constipation:  Benzoyl Peroxide     Colace  Clindamycin      Dulcolax Suppository  Topica Erythromycin     Fibercon  Salicylic Acid      Metamucil         Miralax AVOID:        Senakot   Accutane    Cough:  Retin-A       Cough Drops  Tetracycline      Phenergan w/ Codeine if Rx  Minocycline      Robitussin (Plain & DM)  Antibiotics:     Crabs/Lice:  Ceclor       RID  Cephalosporins    AVOID:  E-Mycins      Kwell  Keflex  Macrobid/Macrodantin   Diarrhea:  Penicillin      Kao-Pectate  Zithromax      Imodium AD         PUSH  FLUIDS AVOID:       Cipro     Fever:  Tetracycline      Tylenol (Regular or Extra  Minocycline       Strength)  Levaquin      Extra Strength-Do not          Exceed 8 tabs/24 hrs Caffeine:        <248m/day (equiv. To 1 cup of coffee or  approx. 3 12 oz sodas)         Gas: Cold/Hayfever:       Gas-X  Benadryl      Mylicon  Claritin       Phazyme  **Claritin-D        Chlor-Trimeton    Headaches:  Dimetapp      ASA-Free Excedrin  Drixoral-Non-Drowsy     Cold Compress  Mucinex (Guaifenasin)     Tylenol (Regular or Extra  Sudafed/Sudafed-12 Hour     Strength)  **Sudafed PE Pseudoephedrine   Tylenol Cold & Sinus     Vicks Vapor Rub  Zyrtec  **AVOID if Problems With Blood Pressure         Heartburn: Avoid lying down for at least 1 hour after meals  Aciphex      Maalox     Rash:  Milk of Magnesia     Benadryl    Mylanta       1% Hydrocortisone Cream  Pepcid  Pepcid Complete   Sleep Aids:  Prevacid      Ambien   Prilosec       Benadryl  Rolaids       Chamomile Tea  Tums (Limit 4/day)     Unisom  Zantac       Tylenol PM         Warm milk-add vanilla or  Hemorrhoids:       Sugar for taste  Anusol/Anusol H.C.  (RX: Analapram 2.5%)  Sugar Substitutes:  Hydrocortisone OTC     Ok in moderation  Preparation H      Tucks        Vaseline lotion applied to tissue with wiping  Herpes:     Throat:  Acyclovir      Oragel  Famvir  Valtrex     Vaccines:         Flu Shot Leg Cramps:       *Gardasil  Benadryl      Hepatitis A         Hepatitis B Nasal Spray:       Pneumovax  Saline Nasal Spray     Polio Booster         Tetanus Nausea:       Tuberculosis test or PPD  Vitamin B6 25 mg TID   AVOID:    Dramamine      *Gardasil  Emetrol       Live Poliovirus  Ginger Root 250 mg QID    MMR (measles, mumps &  High Complex Carbs @ Bedtime    rebella)  Sea Bands-Accupressure    Varicella (Chickenpox)  Unisom 1/2 tab TID     *No known complications           If received  before Pain:         Known pregnancy;   Darvocet       Resume series after  Lortab        Delivery  Percocet    Yeast:   Tramadol      Femstat  Tylenol 3      Gyne-lotrimin  Ultram       Monistat  Vicodin           MISC:         All Sunscreens           Hair Coloring/highlights          Insect Repellant's          (Including DEET)         Mystic Tans    Second Trimester of Pregnancy  The second trimester is from week 14 through week 27 (month 4 through 6). This is often the time in pregnancy that you feel your best. Often times, morning sickness has lessened or quit. You may have more energy, and you may get hungry more often. Your unborn baby is growing rapidly. At the end of the sixth month, he or she is about 9 inches long and weighs about 1 pounds. You will likely feel the baby move between 18 and 20 weeks of pregnancy. Follow these instructions at home: Medicines  Take over-the-counter and prescription medicines only as told by your doctor. Some medicines are safe and some medicines are not safe during pregnancy.  Take a prenatal vitamin that contains at least 600 micrograms (mcg) of folic acid.  If you have trouble pooping (constipation), take medicine that will make your stool soft (stool softener) if your doctor approves. Eating and drinking   Eat regular, healthy meals.  Avoid raw meat and uncooked cheese.  If you get low calcium from the food you eat, talk to your doctor about taking a daily calcium supplement.  Avoid foods that are high in fat and sugars, such as fried and sweet foods.  If you feel sick to your stomach (nauseous) or throw up (vomit): ? Eat 4 or 5 small meals a day instead of 3 large meals. ? Try eating a few soda crackers. ? Drink liquids between meals instead of during meals.  To prevent constipation: ? Eat foods that are high in fiber, like fresh fruits and vegetables, whole grains, and beans. ? Drink enough fluids   keep your pee (urine)  clear or pale yellow. Activity  Exercise only as told by your doctor. Stop exercising if you start to have cramps.  Do not exercise if it is too hot, too humid, or if you are in a place of great height (high altitude).  Avoid heavy lifting.  Wear low-heeled shoes. Sit and stand up straight.  You can continue to have sex unless your doctor tells you not to. Relieving pain and discomfort  Wear a good support bra if your breasts are tender.  Take warm water baths (sitz baths) to soothe pain or discomfort caused by hemorrhoids. Use hemorrhoid cream if your doctor approves.  Rest with your legs raised if you have leg cramps or low back pain.  If you develop puffy, bulging veins (varicose veins) in your legs: ? Wear support hose or compression stockings as told by your doctor. ? Raise (elevate) your feet for 15 minutes, 3-4 times a day. ? Limit salt in your food. Prenatal care  Write down your questions. Take them to your prenatal visits.  Keep all your prenatal visits as told by your doctor. This is important. Safety  Wear your seat belt when driving.  Make a list of emergency phone numbers, including numbers for family, friends, the hospital, and police and fire departments. General instructions  Ask your doctor about the right foods to eat or for help finding a counselor, if you need these services.  Ask your doctor about local prenatal classes. Begin classes before month 6 of your pregnancy.  Do not use hot tubs, steam rooms, or saunas.  Do not douche or use tampons or scented sanitary pads.  Do not cross your legs for long periods of time.  Visit your dentist if you have not done so. Use a soft toothbrush to brush your teeth. Floss gently.  Avoid all smoking, herbs, and alcohol. Avoid drugs that are not approved by your doctor.  Do not use any products that contain nicotine or tobacco, such as cigarettes and e-cigarettes. If you need help quitting, ask your  doctor.  Avoid cat litter boxes and soil used by cats. These carry germs that can cause birth defects in the baby and can cause a loss of your baby (miscarriage) or stillbirth. Contact a doctor if:  You have mild cramps or pressure in your lower belly.  You have pain when you pee (urinate).  You have bad smelling fluid coming from your vagina.  You continue to feel sick to your stomach (nauseous), throw up (vomit), or have watery poop (diarrhea).  You have a nagging pain in your belly area.  You feel dizzy. Get help right away if:  You have a fever.  You are leaking fluid from your vagina.  You have spotting or bleeding from your vagina.  You have severe belly cramping or pain.  You lose or gain weight rapidly.  You have trouble catching your breath and have chest pain.  You notice sudden or extreme puffiness (swelling) of your face, hands, ankles, feet, or legs.  You have not felt the baby move in over an hour.  You have severe headaches that do not go away when you take medicine.  You have trouble seeing. Summary  The second trimester is from week 14 through week 27 (months 4 through 6). This is often the time in pregnancy that you feel your best.  To take care of yourself and your unborn baby, you will need to eat healthy meals, take  medicines only if your doctor tells you to do so, and do activities that are safe for you and your baby.  Call your doctor if you get sick or if you notice anything unusual about your pregnancy. Also, call your doctor if you need help with the right food to eat, or if you want to know what activities are safe for you. This information is not intended to replace advice given to you by your health care provider. Make sure you discuss any questions you have with your health care provider. Document Revised: 11/30/2018 Document Reviewed: 09/13/2016 Elsevier Patient Education  [place].

## 2019-11-08 NOTE — Progress Notes (Signed)
ROB-Reports feeling light headed and warm to touch with intermittent increased work of breathing for the last three (3) months. O2 sat: 99%, FSBG: 77. Discussed home treatment measures for decreased blood pressure in the second trimester. Advised cardiology consultation if symptoms continue. Education regarding TWG recommendation and healthy eating in pregnancy; see AVS. Anticipatory guidance regarding course of prenatal care. Reviewed red flag symptoms and when to call. RTC x 4 weeks for 28 wk labs, TDaP/Rhogam, and ROB or sooner if needed.

## 2019-11-08 NOTE — Progress Notes (Signed)
ROB-Patient c/o multiple episodes of feeling light headed, hot, difficulty breathing and nausea x3 months.  Sat O2-99.  Blood sugar reading-77.

## 2019-11-29 ENCOUNTER — Telehealth: Payer: Self-pay

## 2019-11-29 ENCOUNTER — Telehealth: Payer: Self-pay | Admitting: Certified Nurse Midwife

## 2019-11-29 NOTE — Telephone Encounter (Signed)
Please contact patient, is pain located on right or left side? If unresponsive to home treatment measures, then have her go to birthing suites at [facility] for further evaluation. Thanks, JML

## 2019-11-29 NOTE — Telephone Encounter (Signed)
Attempted to contact patient via telephone- no answer- mailbox is full. Mychart message sent to patient.

## 2019-11-29 NOTE — Telephone Encounter (Signed)
Patient stated she's having sharp upper abdominal pain and that it hurts to stand straight tup. Patient denies loosing mucus plug, vaginal bleeding, or contractions. Could you please advise?

## 2019-12-02 ENCOUNTER — Telehealth: Payer: Self-pay

## 2019-12-02 NOTE — Telephone Encounter (Signed)
Error

## 2019-12-03 ENCOUNTER — Ambulatory Visit (INDEPENDENT_AMBULATORY_CARE_PROVIDER_SITE_OTHER): Payer: Medicaid Other | Admitting: Certified Nurse Midwife

## 2019-12-03 ENCOUNTER — Other Ambulatory Visit: Payer: Self-pay

## 2019-12-03 ENCOUNTER — Encounter: Payer: Self-pay | Admitting: Certified Nurse Midwife

## 2019-12-03 ENCOUNTER — Other Ambulatory Visit: Payer: Medicaid Other

## 2019-12-03 VITALS — BP 110/58 | HR 85 | Wt 155.3 lb

## 2019-12-03 DIAGNOSIS — Z23 Encounter for immunization: Secondary | ICD-10-CM

## 2019-12-03 DIAGNOSIS — Z6741 Type O blood, Rh negative: Secondary | ICD-10-CM

## 2019-12-03 DIAGNOSIS — O360130 Maternal care for anti-D [Rh] antibodies, third trimester, not applicable or unspecified: Secondary | ICD-10-CM | POA: Diagnosis not present

## 2019-12-03 DIAGNOSIS — Z3A28 28 weeks gestation of pregnancy: Secondary | ICD-10-CM

## 2019-12-03 LAB — POCT URINALYSIS DIPSTICK OB
Bilirubin, UA: NEGATIVE
Blood, UA: NEGATIVE
Glucose, UA: NEGATIVE
Ketones, UA: NEGATIVE
Leukocytes, UA: NEGATIVE
Nitrite, UA: NEGATIVE
POC,PROTEIN,UA: NEGATIVE
Spec Grav, UA: 1.01 (ref 1.010–1.025)
Urobilinogen, UA: 0.2 E.U./dL
pH, UA: 5 (ref 5.0–8.0)

## 2019-12-03 MED ORDER — TETANUS-DIPHTH-ACELL PERTUSSIS 5-2.5-18.5 LF-MCG/0.5 IM SUSP
0.5000 mL | Freq: Once | INTRAMUSCULAR | Status: AC
  Administered 2019-12-03: 0.5 mL via INTRAMUSCULAR

## 2019-12-03 MED ORDER — RHO D IMMUNE GLOBULIN 1500 UNITS IM SOSY
1500.0000 [IU] | PREFILLED_SYRINGE | Freq: Once | INTRAMUSCULAR | Status: AC
  Administered 2019-12-03: 1500 [IU] via INTRAMUSCULAR

## 2019-12-03 NOTE — Progress Notes (Signed)
ROB doing ell. Feels good movement. 28 wk labs today. TDAP/BTC/RR/CBC/Glucose screen. Discussed BC after delivery and birth plan. Sample birth plan given. Discussed RSB, see pregnancy check list for topics reviewed. Follow up 2 wk rob with Sharyn Lull.   Philip Aspen, CNM

## 2019-12-03 NOTE — Patient Instructions (Signed)
Glucose Tolerance Test During Pregnancy Why am I having this test? The glucose tolerance test (GTT) is done to check how your body processes sugar (glucose). This is one of several tests used to diagnose diabetes that develops during pregnancy (gestational diabetes mellitus). Gestational diabetes is a temporary form of diabetes that some women develop during pregnancy. It usually occurs during the second trimester of pregnancy and goes away after delivery. Testing (screening) for gestational diabetes usually occurs between 24 and 28 weeks of pregnancy. You may have the GTT test after having a 1-hour glucose screening test if the results from that test indicate that you may have gestational diabetes. You may also have this test if:  You have a history of gestational diabetes.  You have a history of giving birth to very large babies or have experienced repeated fetal loss (stillbirth).  You have signs and symptoms of diabetes, such as: ? Changes in your vision. ? Tingling or numbness in your hands or feet. ? Changes in hunger, thirst, and urination that are not otherwise explained by your pregnancy. What is being tested? This test measures the amount of glucose in your blood at different times during a period of 3 hours. This indicates how well your body is able to process glucose. What kind of sample is taken?  Blood samples are required for this test. They are usually collected by inserting a needle into a blood vessel. How do I prepare for this test?  For 3 days before your test, eat normally. Have plenty of carbohydrate-rich foods.  Follow instructions from your health care provider about: ? Eating or drinking restrictions on the day of the test. You may be asked to not eat or drink anything other than water (fast) starting 8-10 hours before the test. ? Changing or stopping your regular medicines. Some medicines may interfere with this test. Tell a health care provider about:  All  medicines you are taking, including vitamins, herbs, eye drops, creams, and over-the-counter medicines.  Any blood disorders you have.  Any surgeries you have had.  Any medical conditions you have. What happens during the test? First, your blood glucose will be measured. This is referred to as your fasting blood glucose, since you fasted before the test. Then, you will drink a glucose solution that contains a certain amount of glucose. Your blood glucose will be measured again 1, 2, and 3 hours after drinking the solution. This test takes about 3 hours to complete. You will need to stay at the testing location during this time. During the testing period:  Do not eat or drink anything other than the glucose solution.  Do not exercise.  Do not use any products that contain nicotine or tobacco, such as cigarettes and e-cigarettes. If you need help stopping, ask your health care provider. The testing procedure may vary among health care providers and hospitals. How are the results reported? Your results will be reported as milligrams of glucose per deciliter of blood (mg/dL) or millimoles per liter (mmol/L). Your health care provider will compare your results to normal ranges that were established after testing a large group of people (reference ranges). Reference ranges may vary among labs and hospitals. For this test, common reference ranges are:  Fasting: less than 95-105 mg/dL (5.3-5.8 mmol/L).  1 hour after drinking glucose: less than 180-190 mg/dL (10.0-10.5 mmol/L).  2 hours after drinking glucose: less than 155-165 mg/dL (8.6-9.2 mmol/L).  3 hours after drinking glucose: 140-145 mg/dL (7.8-8.1 mmol/L). What do the   results mean? Results within reference ranges are considered normal, meaning that your glucose levels are well-controlled. If two or more of your blood glucose levels are high, you may be diagnosed with gestational diabetes. If only one level is high, your health care  provider may suggest repeat testing or other tests to confirm a diagnosis. Talk with your health care provider about what your results mean. Questions to ask your health care provider Ask your health care provider, or the department that is doing the test:  When will my results be ready?  How will I get my results?  What are my treatment options?  What other tests do I need?  What are my next steps? Summary  The glucose tolerance test (GTT) is one of several tests used to diagnose diabetes that develops during pregnancy (gestational diabetes mellitus). Gestational diabetes is a temporary form of diabetes that some women develop during pregnancy.  You may have the GTT test after having a 1-hour glucose screening test if the results from that test indicate that you may have gestational diabetes. You may also have this test if you have any symptoms or risk factors for gestational diabetes.  Talk with your health care provider about what your results mean. This information is not intended to replace advice given to you by your health care provider. Make sure you discuss any questions you have with your health care provider. Document Revised: 11/29/2018 Document Reviewed: 03/20/2017 Elsevier Patient Education  2020 Elsevier Inc.  

## 2019-12-04 LAB — CBC
Hematocrit: 34.3 % (ref 34.0–46.6)
Hemoglobin: 11.5 g/dL (ref 11.1–15.9)
MCH: 29.3 pg (ref 26.6–33.0)
MCHC: 33.5 g/dL (ref 31.5–35.7)
MCV: 88 fL (ref 79–97)
Platelets: 194 10*3/uL (ref 150–450)
RBC: 3.92 x10E6/uL (ref 3.77–5.28)
RDW: 13.8 % (ref 11.7–15.4)
WBC: 6.5 10*3/uL (ref 3.4–10.8)

## 2019-12-04 LAB — GLUCOSE, 1 HOUR GESTATIONAL: Gestational Diabetes Screen: 130 mg/dL (ref 65–139)

## 2019-12-04 LAB — RPR: RPR Ser Ql: NONREACTIVE

## 2019-12-05 ENCOUNTER — Telehealth: Payer: Self-pay

## 2019-12-05 NOTE — Telephone Encounter (Signed)
Pt was calling in asking about her message from [place].  I informed the pt that michelle in with pts. The pt is requesting a call back. Please advise   HERE IS HER [place], so last Friday, Saturday and Sunday I had cramps and back pains and multiple bowel movements. This morning I am having these pains again. Theres a family history of gi issues. My mother has had several surgeries on her small intestines and an ileostomy bag. Her father had similar issues and I just want it to be noted. Im hoping Im not having the same problems as my mother did but I just want to see if there is anything that I can do or what I should do from here

## 2019-12-05 NOTE — Telephone Encounter (Signed)
Please see several other mychart messages.

## 2019-12-06 ENCOUNTER — Telehealth: Payer: Self-pay | Admitting: Certified Nurse Midwife

## 2019-12-06 DIAGNOSIS — Z3493 Encounter for supervision of normal pregnancy, unspecified, third trimester: Secondary | ICD-10-CM

## 2019-12-06 DIAGNOSIS — Z8379 Family history of other diseases of the digestive system: Secondary | ICD-10-CM

## 2019-12-06 NOTE — Telephone Encounter (Signed)
Cramps from rib cage to her back and the pain comes and goes, patient would like a call from provider. Patient doesn't want to keep going back forth through MyChart as she said she isn't getting a clear answer.

## 2019-12-06 NOTE — Telephone Encounter (Signed)
30 Telephone call to patient, verified full name and date of birth.   Reports intermittent back and abdominal pain since last week. Mother had similar symptoms and she has a lot of GI issues.   No pain today. Recommend Imodium as needed.   GI referral placed per patient request, see chart.    Diona Fanti, CNM [facility], [facility] 12/06/19 10:51 AM

## 2019-12-09 ENCOUNTER — Other Ambulatory Visit: Payer: Medicaid Other

## 2019-12-09 ENCOUNTER — Encounter: Payer: Medicaid Other | Admitting: Certified Nurse Midwife

## 2019-12-17 ENCOUNTER — Ambulatory Visit (INDEPENDENT_AMBULATORY_CARE_PROVIDER_SITE_OTHER): Payer: Medicaid Other | Admitting: Certified Nurse Midwife

## 2019-12-17 ENCOUNTER — Encounter: Payer: Self-pay | Admitting: Certified Nurse Midwife

## 2019-12-17 ENCOUNTER — Other Ambulatory Visit: Payer: Self-pay

## 2019-12-17 VITALS — BP 112/69 | HR 78 | Wt 158.3 lb

## 2019-12-17 DIAGNOSIS — Z3A30 30 weeks gestation of pregnancy: Secondary | ICD-10-CM

## 2019-12-17 NOTE — Progress Notes (Signed)
ROB doing well. Feels good movement. Plans breastfeeding and epidural. Possible IV medications if needed. She is unsure on birth control. We talked about a couple of methods. She is considering tubal , IUD, discussed depo injection, nexplanon, tubal, and IUD. She is still unsure. Follow up 2 wks with Sharyn Lull.   Philip Aspen, CNM

## 2019-12-17 NOTE — Patient Instructions (Signed)
[place] Pediatrician List  [facility]  [address], [place], [place] [address]  Phone: [phone]  [facility] (second location)  [address] [place], [place] [address]  Phone: [phone]  [facility] ([place]) [address], [place], [place] [address] Phone: [phone]  [facility]  [address] [place], [place] [address]  Phone: [phone] 

## 2020-01-02 ENCOUNTER — Other Ambulatory Visit: Payer: Self-pay

## 2020-01-02 ENCOUNTER — Ambulatory Visit (INDEPENDENT_AMBULATORY_CARE_PROVIDER_SITE_OTHER): Payer: Medicaid Other | Admitting: Certified Nurse Midwife

## 2020-01-02 ENCOUNTER — Encounter: Payer: Self-pay | Admitting: Certified Nurse Midwife

## 2020-01-02 VITALS — BP 119/73 | HR 73 | Wt 160.3 lb

## 2020-01-02 DIAGNOSIS — O99613 Diseases of the digestive system complicating pregnancy, third trimester: Secondary | ICD-10-CM

## 2020-01-02 DIAGNOSIS — Z3493 Encounter for supervision of normal pregnancy, unspecified, third trimester: Secondary | ICD-10-CM

## 2020-01-02 DIAGNOSIS — K5900 Constipation, unspecified: Secondary | ICD-10-CM

## 2020-01-02 DIAGNOSIS — Z3A32 32 weeks gestation of pregnancy: Secondary | ICD-10-CM

## 2020-01-02 LAB — POCT URINALYSIS DIPSTICK OB
Bilirubin, UA: NEGATIVE
Glucose, UA: NEGATIVE
Ketones, UA: NEGATIVE
Nitrite, UA: NEGATIVE
Odor: NEGATIVE
POC,PROTEIN,UA: NEGATIVE
Spec Grav, UA: 1.01 (ref 1.010–1.025)
Urobilinogen, UA: 0.2 E.U./dL
pH, UA: 8 (ref 5.0–8.0)

## 2020-01-02 MED ORDER — POLYETHYLENE GLYCOL 3350 17 GM/SCOOP PO POWD: 1.0000 | Freq: Once | ORAL | 0 refills | Status: AC

## 2020-01-02 NOTE — Progress Notes (Signed)
I have seen, interviewed, and examined the patient in conjunction with the [facility] Nurse Practitioner student and affirm the diagnosis and management plan.   Diona Fanti, CNM [facility], [facility] 01/02/20 4:08 PM

## 2020-01-02 NOTE — Progress Notes (Signed)
ROB- Doing well. Reports blurred vision yesterday to left eye when working in her garden. Symptoms have resolved. Reports constipation. Rx'd Miralax. See orders. Denies headache, epigastric pain and leg swelling. Unsure of last eye exam. Encouraged patient to also follow up with the eye doctor. Encouraged to increase water intake to 80-100 oz of water daily and increase protein. Anticipatory guidance given and reviewed red flags and when to call the office.   RTC x 2 weeks for ROB or sooner if needed.   Fransico Him RN [place] 01/02/20 12:18 PM

## 2020-01-02 NOTE — Progress Notes (Signed)
ROB- pt complains of constipation. X 3days. Has tried bran cereal and prune juice. No relief. Safe med list  Given.

## 2020-01-02 NOTE — Patient Instructions (Signed)

## 2020-01-07 NOTE — Telephone Encounter (Signed)
Please contact patient to schedule NST and OB problem visit later this week. Thanks, JML

## 2020-01-10 ENCOUNTER — Other Ambulatory Visit: Payer: Medicaid Other

## 2020-01-10 ENCOUNTER — Ambulatory Visit (INDEPENDENT_AMBULATORY_CARE_PROVIDER_SITE_OTHER): Payer: Medicaid Other | Admitting: Certified Nurse Midwife

## 2020-01-10 ENCOUNTER — Other Ambulatory Visit: Payer: Self-pay

## 2020-01-10 VITALS — BP 125/83 | HR 68 | Wt 161.5 lb

## 2020-01-10 DIAGNOSIS — Z3A33 33 weeks gestation of pregnancy: Secondary | ICD-10-CM | POA: Diagnosis not present

## 2020-01-10 DIAGNOSIS — R319 Hematuria, unspecified: Secondary | ICD-10-CM | POA: Diagnosis not present

## 2020-01-10 DIAGNOSIS — R82998 Other abnormal findings in urine: Secondary | ICD-10-CM

## 2020-01-10 DIAGNOSIS — Z3493 Encounter for supervision of normal pregnancy, unspecified, third trimester: Secondary | ICD-10-CM

## 2020-01-10 LAB — POCT URINALYSIS DIPSTICK OB
Bilirubin, UA: NEGATIVE
Blood, UA: NEGATIVE
Glucose, UA: NEGATIVE
Ketones, UA: NEGATIVE
Leukocytes, UA: NEGATIVE
Nitrite, UA: NEGATIVE
POC,PROTEIN,UA: NEGATIVE
Spec Grav, UA: 1.01 (ref 1.010–1.025)
Urobilinogen, UA: 0.2 E.U./dL
pH, UA: 5 (ref 5.0–8.0)

## 2020-01-10 NOTE — Patient Instructions (Signed)
Third Trimester of Pregnancy  The third trimester is from week 28 through week 40 (months 7 through 9). This trimester is when your unborn baby (fetus) is growing very fast. At the end of the ninth month, the unborn baby is about 20 inches in length. It weighs about 6-10 pounds. Follow these instructions at home: Medicines  Take over-the-counter and prescription medicines only as told by your doctor. Some medicines are safe and some medicines are not safe during pregnancy.  Take a prenatal vitamin that contains at least 600 micrograms (mcg) of folic acid.  If you have trouble pooping (constipation), take medicine that will make your stool soft (stool softener) if your doctor approves. Eating and drinking   Eat regular, healthy meals.  Avoid raw meat and uncooked cheese.  If you get low calcium from the food you eat, talk to your doctor about taking a daily calcium supplement.  Eat four or five small meals rather than three large meals a day.  Avoid foods that are high in fat and sugars, such as fried and sweet foods.  To prevent constipation: ? Eat foods that are high in fiber, like fresh fruits and vegetables, whole grains, and beans. ? Drink enough fluids to keep your pee (urine) clear or pale yellow. Activity  Exercise only as told by your doctor. Stop exercising if you start to have cramps.  Avoid heavy lifting, wear low heels, and sit up straight.  Do not exercise if it is too hot, too humid, or if you are in a place of great height (high altitude).  You may continue to have sex unless your doctor tells you not to. Relieving pain and discomfort  Wear a good support bra if your breasts are tender.  Take frequent breaks and rest with your legs raised if you have leg cramps or low back pain.  Take warm water baths (sitz baths) to soothe pain or discomfort caused by hemorrhoids. Use hemorrhoid cream if your doctor approves.  If you develop puffy, bulging veins (varicose  veins) in your legs: ? Wear support hose or compression stockings as told by your doctor. ? Raise (elevate) your feet for 15 minutes, 3-4 times a day. ? Limit salt in your food. Safety  Wear your seat belt when driving.  Make a list of emergency phone numbers, including numbers for family, friends, the hospital, and police and fire departments. Preparing for your baby's arrival To prepare for the arrival of your baby:  Take prenatal classes.  Practice driving to the hospital.  Visit the hospital and tour the maternity area.  Talk to your work about taking leave once the baby comes.  Pack your hospital bag.  Prepare the baby's room.  Go to your doctor visits.  Buy a rear-facing car seat. Learn how to install it in your car. General instructions  Do not use hot tubs, steam rooms, or saunas.  Do not use any products that contain nicotine or tobacco, such as cigarettes and e-cigarettes. If you need help quitting, ask your doctor.  Do not drink alcohol.  Do not douche or use tampons or scented sanitary pads.  Do not cross your legs for long periods of time.  Do not travel for long distances unless you must. Only do so if your doctor says it is okay.  Visit your dentist if you have not gone during your pregnancy. Use a soft toothbrush to brush your teeth. Be gentle when you floss.  Avoid cat litter boxes and soil   used by cats. These carry germs that can cause birth defects in the baby and can cause a loss of your baby (miscarriage) or stillbirth.  Keep all your prenatal visits as told by your doctor. This is important. Contact a doctor if:  You are not sure if you are in labor or if your water has broken.  You are dizzy.  You have mild cramps or pressure in your lower belly.  You have a nagging pain in your belly area.  You continue to feel sick to your stomach, you throw up, or you have watery poop.  You have bad smelling fluid coming from your vagina.  You have  pain when you pee. Get help right away if:  You have a fever.  You are leaking fluid from your vagina.  You are spotting or bleeding from your vagina.  You have severe belly cramps or pain.  You lose or gain weight quickly.  You have trouble catching your breath and have chest pain.  You notice sudden or extreme puffiness (swelling) of your face, hands, ankles, feet, or legs.  You have not felt the baby move in over an hour.  You have severe headaches that do not go away with medicine.  You have trouble seeing.  You are leaking, or you are having a gush of fluid, from your vagina before you are 37 weeks.  You have regular belly spasms (contractions) before you are 37 weeks. Summary  The third trimester is from week 28 through week 40 (months 7 through 9). This time is when your unborn baby is growing very fast.  Follow your doctor's advice about medicine, food, and activity.  Get ready for the arrival of your baby by taking prenatal classes, getting all the baby items ready, preparing the baby's room, and visiting your doctor to be checked.  Get help right away if you are bleeding from your vagina, or you have chest pain and trouble catching your breath, or if you have not felt your baby move in over an hour. This information is not intended to replace advice given to you by your health care provider. Make sure you discuss any questions you have with your health care provider. Document Revised: 11/29/2018 Document Reviewed: 09/13/2016 Elsevier Patient Education  2020 [place].   Fetal Movement Counts Patient Name: ________________________________________________ Patient Due Date: ____________________ What is a fetal movement count?  A fetal movement count is the number of times that you feel your baby move during a certain amount of time. This may also be called a fetal kick count. A fetal movement count is recommended for every pregnant woman. You may be asked to  start counting fetal movements as early as week 28 of your pregnancy. Pay attention to when your baby is most active. You may notice your baby's sleep and wake cycles. You may also notice things that make your baby move more. You should do a fetal movement count:  When your baby is normally most active.  At the same time each day. A good time to count movements is while you are resting, after having something to eat and drink. How do I count fetal movements? 1. Find a quiet, comfortable area. Sit, or lie down on your side. 2. Write down the date, the start time and stop time, and the number of movements that you felt between those two times. Take this information with you to your health care visits. 3. Write down your start time when you feel  the first movement. 4. Count kicks, flutters, swishes, rolls, and jabs. You should feel at least 10 movements. 5. You may stop counting after you have felt 10 movements, or if you have been counting for 2 hours. Write down the stop time. 6. If you do not feel 10 movements in 2 hours, contact your health care provider for further instructions. Your health care provider may want to do additional tests to assess your baby's well-being. Contact a health care provider if:  You feel fewer than 10 movements in 2 hours.  Your baby is not moving like he or she usually does. Date: ____________ Start time: ____________ Stop time: ____________ Movements: ____________ Date: ____________ Start time: ____________ Stop time: ____________ Movements: ____________ Date: ____________ Start time: ____________ Stop time: ____________ Movements: ____________ Date: ____________ Start time: ____________ Stop time: ____________ Movements: ____________ Date: ____________ Start time: ____________ Stop time: ____________ Movements: ____________ Date: ____________ Start time: ____________ Stop time: ____________ Movements: ____________ Date: ____________ Start time: ____________  Stop time: ____________ Movements: ____________ Date: ____________ Start time: ____________ Stop time: ____________ Movements: ____________ Date: ____________ Start time: ____________ Stop time: ____________ Movements: ____________ This information is not intended to replace advice given to you by your health care provider. Make sure you discuss any questions you have with your health care provider. Document Revised: 03/28/2019 Document Reviewed: 03/28/2019 Elsevier Patient Education  [employer].

## 2020-01-10 NOTE — Progress Notes (Signed)
I have seen, interviewed, and examined the patient in conjunction with the [facility] Nurse Practitioner student and affirm the diagnosis and management plan.   Diona Fanti, CNM [facility], [facility] 01/10/20 5:57 PM

## 2020-01-10 NOTE — Progress Notes (Signed)
OB Problem Visit  Subjective:   ANALECIA YTUARTE is a 29 y.o. [id number] [redacted]w[redacted]d being seen today for work in problem visit.  Patient reports fetal hiccupsand has concerns for fetal safety since [employer] states that hiccups are associated with umbilical cord compression.   Denies contractions, vaginal bleeding or leaking of fluid.  Reports good fetal movement.  Denies difficulty breathing or respiratory distress, chest pain, abdominal pain, excessive vaginal bleeding, dysuria, leg pain or swelling  The following portions of the patient's history were reviewed and updated as appropriate: allergies, current medications, past family history, past medical history, past social history, past surgical history and problem list.   Review of Systems:  ROS negative except as noted above. Information obtained from patient.   Objective:   BP 125/83   Pulse 68   Wt 161 lb 8 oz (73.3 kg)   LMP 05/22/2019 (Exact Date)   BMI 26.07 kg/m   FHT: Fetal Heart Rate (bpm): 140  Fetal Movement: Movement: Present    Abdomen:  soft, gravid, appropriate for gestational age,non-tender  Vaginal:  Discharge, none    Cervix: Cervical exam deferred   Results for orders placed or performed in visit on 01/10/20 (from the past 24 hour(s))  POC Urinalysis Dipstick OB     Status: None   Collection Time: 01/10/20  3:27 PM  Result Value Ref Range   Color, UA yellow    Clarity, UA clear    Glucose, UA Negative Negative   Bilirubin, UA neg    Ketones, UA neg    Spec Grav, UA 1.010 1.010 - 1.025   Blood, UA neg    pH, UA 5.0 5.0 - 8.0   POC,PROTEIN,UA Negative Negative, Trace, Small (1+), Moderate (2+), Large (3+), 4+   Urobilinogen, UA 0.2 0.2 or 1.0 E.U./dL   Nitrite, UA neg    Leukocytes, UA Negative Negative   Appearance     Odor      Assessment:   Pregnancy:  G3P2002 at [redacted]w[redacted]d  1. [redacted] weeks gestation of pregnancy  - POC Urinalysis Dipstick OB - Urine Culture  2. Third trimester pregnancy  - Urine  Culture  3. Hematuria, unspecified type  - Urine Culture  4. Leukocytes in urine  - Urine Culture  Assessment and Plan:    Preterm labor symptoms: vaginal bleeding, contractions and leaking of fluid reviewed in detail.  Fetal movement precautions reviewed.  POC urine in office at last visit showed leukocytosis and hematuria. Urine culture sent today.  NST completed in office for patient reassurance and found to be reactive. Reviewed with patient. See chart.  Reviewed Red flags and when to call the office.  RTC as previously scheduled.   Fransico Him RN [place] 01/10/20 4:02 PM

## 2020-01-16 ENCOUNTER — Ambulatory Visit (INDEPENDENT_AMBULATORY_CARE_PROVIDER_SITE_OTHER): Payer: Medicaid Other | Admitting: Gastroenterology

## 2020-01-16 ENCOUNTER — Other Ambulatory Visit: Payer: Self-pay

## 2020-01-16 ENCOUNTER — Encounter: Payer: Self-pay | Admitting: Gastroenterology

## 2020-01-16 VITALS — BP 131/77 | HR 93 | Temp 97.8°F | Ht 66.0 in | Wt 164.0 lb

## 2020-01-16 DIAGNOSIS — R1013 Epigastric pain: Secondary | ICD-10-CM

## 2020-01-16 NOTE — Progress Notes (Signed)
Tina Darby, MD [address]  [place]  [place], [place] [address]  Main: [phone]  Fax: [phone]    Gastroenterology Consultation  Referring Provider:     Sharlette Dense* Primary Care Physician:  Diona Fanti, CNM Primary Gastroenterologist:  Dr. Cephas Hughes Reason for Consultation: Family history of GI problem, epigastric pain        HPI:   Tina Hughes is a 29 y.o. female referred by Dr. Verdene Rio, Lara Mulch, CNM  for consultation & management of family history of GI problem.  Patient is currently [redacted] weeks pregnant, in her third pregnancy.  When she was 28 weeks of gestation, she experienced epigastric pain radiating to the back which was self-limited.  She reports her previous 2 pregnancies were uneventful and normal.  She is concerned if she has any underlying GI problem because of her mother was diagnosed with intestinal malrotation, had to undergo multiple surgeries most recently.  Patient has heartburn and mild epigastric pain for which she takes Tums, provides relief.  She also has constipation for which she takes prune juice and coffee, keeps her bowels regular.  This pregnancy so far has been uneventful.  Her labs including CBC normal.  Mild elevated alkaline phosphatase in 09/2017 during her second pregnancy  NSAIDs: None  Antiplts/Anticoagulants/Anti thrombotics: None  GI Procedures: None  Past Medical History:  Diagnosis Date  . Anxiety   . Vegetarian     History reviewed. No pertinent surgical history.  Current Outpatient Medications:  .  prenatal vitamin w/FE, FA (PRENATAL 1 + 1) 27-1 MG TABS tablet, Take 1 tablet by mouth daily at 12 noon., Disp: , Rfl:    Family History  Problem Relation Age of Onset  . Cancer Paternal Grandmother        skin cancer  . Irritable bowel syndrome Mother   . Breast cancer Neg Hx   . Ovarian cancer Neg Hx   . Colon cancer Neg Hx      Social History   Tobacco Use  .  Smoking status: Former Research scientist (life sciences)  . Smokeless tobacco: Never Used  Substance Use Topics  . Alcohol use: Not Currently    Comment: occasional  . Drug use: No    Allergies as of 01/16/2020  . (No Known Allergies)    Review of Systems:    All systems reviewed and negative except where noted in HPI.   Physical Exam:  LMP 05/22/2019 (Exact Date)  Patient's last menstrual period was 05/22/2019 (exact date).  General:   Alert,  Well-developed, well-nourished, pleasant and cooperative in NAD Head:  Normocephalic and atraumatic. Eyes:  Sclera clear, no icterus.   Conjunctiva pink. Ears:  Normal auditory acuity. Nose:  No deformity, discharge, or lesions. Mouth:  No deformity or lesions,oropharynx pink & moist. Neck:  Supple; no masses or thyromegaly. Lungs:  Respirations even and unlabored.  Clear throughout to auscultation.   No wheezes, crackles, or rhonchi. No acute distress. Heart:  Regular rate and rhythm; no murmurs, clicks, rubs, or gallops. Abdomen:  Normal bowel sounds. Soft, non-tender and non-distended without masses, hepatosplenomegaly or hernias noted.  No guarding or rebound tenderness.   Rectal: Not performed Msk:  Symmetrical without gross deformities. Good, equal movement & strength bilaterally. Pulses:  Normal pulses noted. Extremities:  No clubbing or edema.  No cyanosis. Neurologic:  Alert and oriented x3;  grossly normal neurologically. Skin:  Intact without significant lesions or rashes. No jaundice. Psych:  Alert and cooperative.  Normal mood and affect.  Imaging Studies: No recent abdominal imaging  Assessment and Plan:   Tina Hughes is a 29 y.o. female with family history of intestinal malrotation in her mother, self-limited episode of epigastric pain radiating to the back at 28 weeks of gestation  Patient is currently 35 weeks of gestation and has been uneventful.  I reassured patient that unless she develops any epigastric pain, I do not recommend  imaging at this time.  She may had an episode of biliary colic.  If her symptoms recur, we can investigate further with  CT abdomen and pelvis, right upper quadrant ultrasound I also reassured her that we can do cross-sectional imaging after her pregnancy to know the anatomy and rule out intestinal malrotation   Follow up as needed   Tina Darby, MD

## 2020-01-17 ENCOUNTER — Ambulatory Visit (INDEPENDENT_AMBULATORY_CARE_PROVIDER_SITE_OTHER): Payer: Medicaid Other | Admitting: Certified Nurse Midwife

## 2020-01-17 ENCOUNTER — Encounter: Payer: Self-pay | Admitting: Certified Nurse Midwife

## 2020-01-17 VITALS — BP 112/85 | HR 90 | Wt 163.0 lb

## 2020-01-17 DIAGNOSIS — Z3493 Encounter for supervision of normal pregnancy, unspecified, third trimester: Secondary | ICD-10-CM

## 2020-01-17 DIAGNOSIS — Z3A34 34 weeks gestation of pregnancy: Secondary | ICD-10-CM

## 2020-01-17 LAB — POCT URINALYSIS DIPSTICK OB
Bilirubin, UA: NEGATIVE
Blood, UA: NEGATIVE
Glucose, UA: NEGATIVE
Ketones, UA: NEGATIVE
Leukocytes, UA: NEGATIVE
Nitrite, UA: NEGATIVE
POC,PROTEIN,UA: NEGATIVE
Spec Grav, UA: 1.01 (ref 1.010–1.025)
Urobilinogen, UA: 0.2 E.U./dL
pH, UA: 8 (ref 5.0–8.0)

## 2020-01-17 NOTE — Progress Notes (Signed)
ROB- Doing well. Reports braxton hick contractions and fatigue. Encouraged home treatment measures including water intake to 80-100 oz a day and increase protein. Discussed thirty-six (36) week labs. Requests information on tubal ligation after pregnancy. Consent signed in office today. see chart. Anticipatory guidance given. Reviewed red flags and when to call the office.  RTC x 2 weeks for ROB and 36 week cultures or sooner if needed.   Fransico Him RN [place] 01/17/20 12:17 PM

## 2020-01-17 NOTE — Progress Notes (Signed)
ROB-Pt present for routine prenatal care. Pt c/o abd pressure and tightness along with braxton hick contractions.

## 2020-01-17 NOTE — Progress Notes (Signed)
I have seen, interviewed, and examined the patient in conjunction with the [place] Nurse [facility] Practitioner student and affirm the diagnosis and management plan.   Diona Fanti, CNM [facility], [facility] 01/17/20 12:56 PM

## 2020-01-17 NOTE — Patient Instructions (Addendum)
Postpartum Tubal Ligation Postpartum tubal ligation (PPTL) is a procedure to close the fallopian tubes. This is done so that you cannot get pregnant. When the fallopian tubes are closed, the eggs that the ovaries release cannot enter the uterus, and sperm cannot reach the eggs. PPTL is done right after childbirth or 1-2 days after childbirth, before the uterus returns to its normal location. If you have a cesarean section, it can be performed at the same time as the procedure. Having this done after childbirth does not make your stay in the hospital longer. PPTL is sometimes called "getting your tubes tied." You should not have this procedure if you want to get pregnant again or if you are unsure about having more children. Tell a health care provider about:  Any allergies you have.  All medicines you are taking, including vitamins, herbs, eye drops, creams, and over-the-counter medicines.  Any problems you or family members have had with anesthetic medicines.  Any blood disorders you have.  Any surgeries you have had.  Any medical conditions you have or have had.  Any past pregnancies. What are the risks? Generally, this is a safe procedure. However, problems may occur, including:  Infection.  Bleeding.  Injury to other organs in the abdomen.  Side effects from anesthetic medicines.  Failure of the procedure. If this happens, you could get pregnant.  Having a fertilized egg attach outside the uterus (ectopic pregnancy). What happens before the procedure?  Ask your health care provider about: ? How much pain you can expect to have. ? What medicines you will be given for pain, especially if you are planning to breastfeed. What happens during the procedure? If you had a vaginal delivery:  You will be given one or more of the following: ? A medicine to help you relax (sedative). ? A medicine to numb the area (local anesthetic). ? A medicine to make you fall asleep (general  anesthetic). ? A medicine that is injected into an area of your body to numb everything below the injection site (regional anesthetic).  If you have been given a general anesthetic, a tube will be put down your throat to help you breathe.  An IV will be inserted into one of your veins.  Your bladder may be emptied with a small tube (catheter).  An incision will be made just below your belly button.  Your fallopian tubes will be located and brought up through the incision.  Your fallopian tubes will be tied off, burned (cauterized), or blocked with a clip, ring, or clamp. A small part in the center of each fallopian tube may be removed.  The incision will be closed with stitches (sutures).  A bandage (dressing) will be placed over the incision. If you had a cesarean delivery:  Tubal ligation will be done through the incision that was used for the cesarean delivery of your baby.  The incision will be closed with sutures.  A dressing will be placed over the incision. The procedure may vary among health care providers and hospitals. What happens after the procedure?  Your blood pressure, heart rate, breathing rate, and blood oxygen level will be monitored until you leave the hospital.  You will be given pain medicine as needed.  Do not drive for 24 hours if you were given a sedative during your procedure. Summary  Postpartum tubal ligation is a procedure that closes the fallopian tubes so you cannot get pregnant anymore.  This procedure is done while you are still   in the hospital after childbirth. If you have a cesarean section, it can be performed at the same time.  Having this done after childbirth does not make your stay in the hospital longer.  Postpartum tubal ligation is considered permanent. You should not have this procedure if you want to get pregnant again or if you are unsure about having more children.  Talk to your health care provider to see if this procedure is  right for you. This information is not intended to replace advice given to you by your health care provider. Make sure you discuss any questions you have with your health care provider. Document Revised: 01/21/2019 Document Reviewed: 06/28/2018 Elsevier Patient Education  2020 Else[employer] Inc[employer]   [employer] of the uterus can occur throughout pregnancy, but they are not always a sign that you are in labor. You may have practice contractions called Braxton Hicks contractions. These false labor contractions are sometimes confused with true labor. What are [place] contractions? Braxton Hicks contractions are tightening movements that occur in the muscles of the uterus before labor. Unlike true labor contractions, these contractions do not result in opening (dilation) and thinning of the cervix. Toward the end of pregnancy (32-34 weeks), Braxton Hicks contractions can happen more often and may become stronger. These contractions are sometimes difficult to tell apart from true labor because they can be very uncomfortable. You should not feel embarrassed if you go to the hospital with false labor. Sometimes, the only way to tell if you are in true labor is for your health care provider to look for changes in the cervix. The health care provider will do a physical exam and may monitor your contractions. If you are not in true labor, the exam should show that your cervix is not dilating and your water has not broken. If there are no other health problems associated with your pregnancy, it is completely safe for you to be sent home with false labor. You may continue to have Braxton Hicks contractions until you go into true labor. How to tell the difference between true labor and false labor True labor  Contractions last 30-70 seconds.  Contractions become very regular.  Discomfort is usually felt in the top of the uterus, and it spreads to the lower abdomen and low  back.  Contractions do not go away with walking.  Contractions usually become more intense and increase in frequency.  The cervix dilates and gets thinner. False labor  Contractions are usually shorter and not as strong as true labor contractions.  Contractions are usually irregular.  Contractions are often felt in the front of the lower abdomen and in the groin.  Contractions may go away when you walk around or change positions while lying down.  Contractions get weaker and are shorter-lasting as time goes on.  The cervix usually does not dilate or become thin. Follow these instructions at home:   Take over-the-counter and prescription medicines only as told by your health care provider.  Keep up with your usual exercises and follow other instructions from your health care provider.  Eat and drink lightly if you think you are going into labor.  If Braxton Hicks contractions are making you uncomfortable: ? Change your position from lying down or resting to walking, or change from walking to resting. ? Sit and rest in a tub of warm water. ? Drink enough fluid to keep your urine pale yellow. Dehydration may cause these contractions. ? Do slow and deep  breathing several times an hour.  Keep all follow-up prenatal visits as told by your health care provider. This is important. Contact a health care provider if:  You have a fever.  You have continuous pain in your abdomen. Get help right away if:  Your contractions become stronger, more regular, and closer together.  You have fluid leaking or gushing from your vagina.  You pass blood-tinged mucus (bloody show).  You have bleeding from your vagina.  You have low back pain that you never had before.  You feel your baby's head pushing down and causing pelvic pressure.  Your baby is not moving inside you as much as it used to. Summary  Contractions that occur before labor are called Braxton Hicks contractions, false  labor, or practice contractions.  Braxton Hicks contractions are usually shorter, weaker, farther apart, and less regular than true labor contractions. True labor contractions usually become progressively stronger and regular, and they become more frequent.  Manage discomfort from [facility] contractions by changing position, resting in a warm bath, drinking plenty of water, or practicing deep breathing. This information is not intended to replace advice given to you by your health care provider. Make sure you discuss any questions you have with your health care provider. Document Revised: 07/21/2017 Document Reviewed: 12/22/2016 Elsevier Patient Education  [employer].   Group B Streptococcus Test During Pregnancy Why am I having this test? Routine testing, also called screening, for group B streptococcus (GBS) is recommended for all pregnant women between the 36th and 37th week of pregnancy. GBS is a type of bacteria that can be passed from mother to baby during childbirth. Screening will help guide whether or not you will need treatment during labor and delivery to prevent complications such as:  An infection in your uterus during labor.  An infection in your uterus after delivery.  A serious infection in your baby after delivery, such as pneumonia, meningitis, or sepsis. GBS screening is not often done before 36 weeks of pregnancy unless you go into labor prematurely. What happens if I have group B streptococcus? If testing shows that you have GBS, your health care provider will recommend treatment with IV antibiotics during labor and delivery. This treatment significantly decreases the risk of complications for you and your baby. If you have a planned C-section and you have GBS, you may not need to be treated with antibiotics because GBS is usually passed to babies after labor starts and your water breaks. If you are in labor or your water breaks before your C-section, it is  possible for GBS to get into your uterus and be passed to your baby, so you might need treatment. Is there a chance I may not need to be tested? You may not need to be tested for GBS if:  You have a urine test that shows GBS before 36 to 37 weeks.  You had a baby with GBS infection after a previous delivery. In these cases, you will automatically be treated for GBS during labor and delivery. What is being tested? This test is done to check if you have group B streptococcus in your vagina or rectum. What kind of sample is taken? To collect samples for this test, your health care provider will swab your vagina and rectum with a cotton swab. The sample is then sent to the lab to see if GBS is present. What happens during the test?   You will remove your clothing from the waist down.  You will lie down on an exam table in the same position as you would for a pelvic exam.  Your health care provider will swab your vagina and rectum to collect samples for a culture test.  You will be able to go home after the test and do all your usual activities. How are the results reported? The test results are reported as positive or negative. What do the results mean?  A positive test means you are at risk for passing GBS to your baby during labor and delivery. Your health care provider will recommend that you are treated with an IV antibiotic during labor and delivery.  A negative test means you are at very low risk of passing GBS to your baby. There is still a low risk of passing GBS to your baby because sometimes test results may report that you do not have a condition when you do (false-negative result) or there is a chance that you may become infected with GBS after the test is done. You most likely will not need to be treated with an antibiotic during labor and delivery. Talk with your health care provider about what your results mean. Questions to ask your health care provider Ask your health care  provider, or the department that is doing the test:  When will my results be ready?  How will I get my results?  What are my treatment options? Summary  Routine testing (screening) for group B streptococcus (GBS) is recommended for all pregnant women between the 36th and 37th week of pregnancy.  GBS is a type of bacteria that can be passed from mother to baby during childbirth.  If testing shows that you have GBS, your health care provider will recommend that you are treated with IV antibiotics during labor and delivery. This treatment almost always prevents infection in newborns. This information is not intended to replace advice given to you by your health care provider. Make sure you discuss any questions you have with your health care provider. Document Revised: 11/29/2018 Document Reviewed: 09/05/2018 Elsevier Patient Education  2020 [employer].   Vaginal Delivery  Vaginal delivery means that you give birth by pushing your baby out of your birth canal (vagina). A team of health care providers will help you before, during, and after vaginal delivery. Birth experiences are unique for every woman and every pregnancy, and birth experiences vary depending on where you choose to give birth. What happens when I arrive at the birth center or hospital? Once you are in labor and have been admitted into the hospital or birth center, your health care provider may:  Review your pregnancy history and any concerns that you have.  Insert an IV into one of your veins. This may be used to give you fluids and medicines.  Check your blood pressure, pulse, temperature, and heart rate (vital signs).  Check whether your bag of water (amniotic sac) has broken (ruptured).  Talk with you about your birth plan and discuss pain control options. Monitoring Your health care provider may monitor your contractions (uterine monitoring) and your baby's heart rate (fetal monitoring). You may need to be  monitored:  Often, but not continuously (intermittently).  All the time or for long periods at a time (continuously). Continuous monitoring may be needed if: ? You are taking certain medicines, such as medicine to relieve pain or make your contractions stronger. ? You have pregnancy or labor complications. Monitoring may be done by:  Placing a special stethoscope or a  handheld monitoring device on your abdomen to check your baby's heartbeat and to check for contractions.  Placing monitors on your abdomen (external monitors) to record your baby's heartbeat and the frequency and length of contractions.  Placing monitors inside your uterus through your vagina (internal monitors) to record your baby's heartbeat and the frequency, length, and strength of your contractions. Depending on the type of monitor, it may remain in your uterus or on your baby's head until birth.  Telemetry. This is a type of continuous monitoring that can be done with external or internal monitors. Instead of having to stay in bed, you are able to move around during telemetry. Physical exam Your health care provider may perform frequent physical exams. This may include:  Checking how and where your baby is positioned in your uterus.  Checking your cervix to determine: ? Whether it is thinning out (effacing). ? Whether it is opening up (dilating). What happens during labor and delivery?  Normal labor and delivery is divided into the following three stages: Stage 1  This is the longest stage of labor.  This stage can last for hours or days.  Throughout this stage, you will feel contractions. Contractions generally feel mild, infrequent, and irregular at first. They get stronger, more frequent (about every 2-3 minutes), and more regular as you move through this stage.  This stage ends when your cervix is completely dilated to 4 inches (10 cm) and completely effaced. Stage 2  This stage starts once your cervix is  completely effaced and dilated and lasts until the delivery of your baby.  This stage may last from 20 minutes to 2 hours.  This is the stage where you will feel an urge to push your baby out of your vagina.  You may feel stretching and burning pain, especially when the widest part of your baby's head passes through the vaginal opening (crowning).  Once your baby is delivered, the umbilical cord will be clamped and cut. This usually occurs after waiting a period of 1-2 minutes after delivery.  Your baby will be placed on your bare chest (skin-to-skin contact) in an upright position and covered with a warm blanket. Watch your baby for feeding cues, like rooting or sucking, and help the baby to your breast for his or her first feeding. Stage 3  This stage starts immediately after the birth of your baby and ends after you deliver the placenta.  This stage may take anywhere from 5 to 30 minutes.  After your baby has been delivered, you will feel contractions as your body expels the placenta and your uterus contracts to control bleeding. What can I expect after labor and delivery?  After labor is over, you and your baby will be monitored closely until you are ready to go home to ensure that you are both healthy. Your health care team will teach you how to care for yourself and your baby.  You and your baby will stay in the same room (rooming in) during your hospital stay. This will encourage early bonding and successful breastfeeding.  You may continue to receive fluids and medicines through an IV.  Your uterus will be checked and massaged regularly (fundal massage).  You will have some soreness and pain in your abdomen, vagina, and the area of skin between your vaginal opening and your anus (perineum).  If an incision was made near your vagina (episiotomy) or if you had some vaginal tearing during delivery, cold compresses may be placed on your  episiotomy or your tear. This helps to reduce  pain and swelling.  You may be given a squirt bottle to use instead of wiping when you go to the bathroom. To use the squirt bottle, follow these steps: ? Before you urinate, fill the squirt bottle with warm water. Do not use hot water. ? After you urinate, while you are sitting on the toilet, use the squirt bottle to rinse the area around your urethra and vaginal opening. This rinses away any urine and blood. ? Fill the squirt bottle with clean water every time you use the bathroom.  It is normal to have vaginal bleeding after delivery. Wear a sanitary pad for vaginal bleeding and discharge. Summary  Vaginal delivery means that you will give birth by pushing your baby out of your birth canal (vagina).  Your health care provider may monitor your contractions (uterine monitoring) and your baby's heart rate (fetal monitoring).  Your health care provider may perform a physical exam.  Normal labor and delivery is divided into three stages.  After labor is over, you and your baby will be monitored closely until you are ready to go home. This information is not intended to replace advice given to you by your health care provider. Make sure you discuss any questions you have with your health care provider. Document Revised: 09/12/2017 Document Reviewed: 09/12/2017 Elsevier Patient Education  2020 [place].   Fetal Movement Counts Patient Name: ________________________________________________ Patient Due Date: ____________________ What is a fetal movement count?  A fetal movement count is the number of times that you feel your baby move during a certain amount of time. This may also be called a fetal kick count. A fetal movement count is recommended for every pregnant woman. You may be asked to start counting fetal movements as early as week 28 of your pregnancy. Pay attention to when your baby is most active. You may notice your baby's sleep and wake cycles. You may also notice things that  make your baby move more. You should do a fetal movement count:  When your baby is normally most active.  At the same time each day. A good time to count movements is while you are resting, after having something to eat and drink. How do I count fetal movements? 1. Find a quiet, comfortable area. Sit, or lie down on your side. 2. Write down the date, the start time and stop time, and the number of movements that you felt between those two times. Take this information with you to your health care visits. 3. Write down your start time when you feel the first movement. 4. Count kicks, flutters, swishes, rolls, and jabs. You should feel at least 10 movements. 5. You may stop counting after you have felt 10 movements, or if you have been counting for 2 hours. Write down the stop time. 6. If you do not feel 10 movements in 2 hours, contact your health care provider for further instructions. Your health care provider may want to do additional tests to assess your baby's well-being. Contact a health care provider if:  You feel fewer than 10 movements in 2 hours.  Your baby is not moving like he or she usually does. Date: ____________ Start time: ____________ Stop time: ____________ Movements: ____________ Date: ____________ Start time: ____________ Stop time: ____________ Movements: ____________ Date: ____________ Start time: ____________ Stop time: ____________ Movements: ____________ Date: ____________ Start time: ____________ Stop time: ____________ Movements: ____________ Date: ____________ Start time: ____________ Stop time: ____________  Movements: ____________ Date: ____________ Start time: ____________ Stop time: ____________ Movements: ____________ Date: ____________ Start time: ____________ Stop time: ____________ Movements: ____________ Date: ____________ Start time: ____________ Stop time: ____________ Movements: ____________ Date: ____________ Start time: ____________ Stop time:  ____________ Movements: ____________ This information is not intended to replace advice given to you by your health care provider. Make sure you discuss any questions you have with your health care provider. Document Revised: 03/28/2019 Document Reviewed: 03/28/2019 Elsevier Patient Education  [place].

## 2020-01-29 ENCOUNTER — Other Ambulatory Visit: Payer: Self-pay

## 2020-01-29 ENCOUNTER — Ambulatory Visit (INDEPENDENT_AMBULATORY_CARE_PROVIDER_SITE_OTHER): Payer: Medicaid Other | Admitting: Certified Nurse Midwife

## 2020-01-29 VITALS — BP 117/79 | HR 73 | Wt 165.4 lb

## 2020-01-29 DIAGNOSIS — Z3A36 36 weeks gestation of pregnancy: Secondary | ICD-10-CM | POA: Diagnosis not present

## 2020-01-29 LAB — POCT URINALYSIS DIPSTICK OB
Bilirubin, UA: NEGATIVE
Blood, UA: NEGATIVE
Glucose, UA: NEGATIVE
Ketones, UA: NEGATIVE
Leukocytes, UA: NEGATIVE
Nitrite, UA: NEGATIVE
POC,PROTEIN,UA: NEGATIVE
Spec Grav, UA: 1.01 (ref 1.010–1.025)
Urobilinogen, UA: 0.2 E.U./dL
pH, UA: 5 (ref 5.0–8.0)

## 2020-01-29 NOTE — Patient Instructions (Signed)
Group B Streptococcus Infection During Pregnancy °Group B Streptococcus (GBS) is a type of bacteria that is often found in healthy people. It is commonly found in the rectum, vagina, and intestines. In people who are healthy and not pregnant, the bacteria rarely cause serious illness or complications. However, women who test positive for GBS during pregnancy can pass the bacteria to the baby during childbirth. This can cause serious infection in the baby after birth. °Women with GBS may also have infections during their pregnancy or soon after childbirth. The infections include urinary tract infections (UTIs) or infections of the uterus. GBS also increases a woman's risk of complications during pregnancy, such as early labor or delivery, miscarriage, or stillbirth. Routine testing for GBS is recommended for all pregnant women. °What are the causes? °This condition is caused by bacteria called Streptococcus agalactiae. °What increases the risk? °You may have a higher risk for GBS infection during pregnancy if you had one during a past pregnancy. °What are the signs or symptoms? °In most cases, GBS infection does not cause symptoms in pregnant women. If symptoms exist, they may include: °· Labor that starts before the 37th week of pregnancy. °· A UTI or bladder infection. This may cause a fever, frequent urination, or pain and burning during urination. °· Fever during labor. There can also be a rapid heartbeat in the mother or baby. °Rare but serious symptoms of a GBS infection in women include: °· Blood infection (septicemia). This may cause fever, chills, or confusion. °· Lung infection (pneumonia). This may cause fever, chills, cough, rapid breathing, chest pain, or difficulty breathing. °· Bone, joint, skin, or soft tissue infection. °How is this diagnosed? °You may be screened for GBS between week 35 and week 37 of pregnancy. If you have symptoms of preterm labor, you may be screened earlier. This condition is  diagnosed based on lab test results from: °· A swab of fluid from the vagina and rectum. °· A urine sample. °How is this treated? °This condition is treated with antibiotic medicine. Antibiotic medicine may be given: °· To you when you go into labor, or as soon as your water breaks. The medicines will continue until after you give birth. If you are having a cesarean delivery, you do not need antibiotics unless your water has broken. °· To your baby, if he or she requires treatment. Your health care provider will check your baby to decide if he or she needs antibiotics to prevent a serious infection. °Follow these instructions at home: °· Take over-the-counter and prescription medicines only as told by your health care provider. °· Take your antibiotic medicine as told by your health care provider. Do not stop taking the antibiotic even if you start to feel better. °· Keep all pre-birth (prenatal) visits and follow-up visits as told by your health care provider. This is important. °Contact a health care provider if: °· You have pain or burning when you urinate. °· You have to urinate more often than usual. °· You have a fever or chills. °· You develop a bad-smelling vaginal discharge. °Get help right away if: °· Your water breaks. °· You go into labor. °· You have severe pain in your abdomen. °· You have difficulty breathing. °· You have chest pain. °These symptoms may represent a serious problem that is an emergency. Do not wait to see if the symptoms will go away. Get medical help right away. Call your local emergency services (911 in the [place] Do not drive yourself to   the hospital. °Summary °· GBS is a type of bacteria that is common in healthy people. °· During pregnancy, colonization with GBS can cause serious complications for you or your baby. °· Your health care provider will screen you between 35 and 37 weeks of pregnancy to determine if you are colonized with GBS. °· If you are colonized with GBS during  pregnancy, your health care provider will recommend antibiotics through an IV during labor. °· After delivery, your baby will be evaluated for complications related to potential GBS infection and may require antibiotics to prevent a serious infection. °This information is not intended to replace advice given to you by your health care provider. Make sure you discuss any questions you have with your health care provider. °Document Revised: 03/04/2019 Document Reviewed: 03/04/2019 °Elsevier Patient Education © 2020 [employer]. ° °

## 2020-01-29 NOTE — Progress Notes (Signed)
ROB doing well. Feels good movement. Thinks she lost some of her mucus plug. GBS and cultures today. Herbal prep handout given. Follow up 1 wk with Sharyn Lull.   Philip Aspen, CNM

## 2020-01-31 LAB — STREP GP B NAA: Strep Gp B NAA: NEGATIVE

## 2020-02-01 LAB — GC/CHLAMYDIA PROBE AMP
Chlamydia trachomatis, NAA: NEGATIVE
Neisseria Gonorrhoeae by PCR: NEGATIVE

## 2020-02-06 ENCOUNTER — Other Ambulatory Visit: Payer: Self-pay

## 2020-02-06 ENCOUNTER — Ambulatory Visit (INDEPENDENT_AMBULATORY_CARE_PROVIDER_SITE_OTHER): Payer: Medicaid Other | Admitting: Certified Nurse Midwife

## 2020-02-06 VITALS — BP 120/72 | HR 69 | Wt 169.6 lb

## 2020-02-06 DIAGNOSIS — Z3493 Encounter for supervision of normal pregnancy, unspecified, third trimester: Secondary | ICD-10-CM

## 2020-02-06 DIAGNOSIS — Z3A37 37 weeks gestation of pregnancy: Secondary | ICD-10-CM

## 2020-02-06 LAB — POCT URINALYSIS DIPSTICK OB
Bilirubin, UA: NEGATIVE
Blood, UA: NEGATIVE
Glucose, UA: NEGATIVE
Ketones, UA: NEGATIVE
Leukocytes, UA: NEGATIVE
Nitrite, UA: NEGATIVE
POC,PROTEIN,UA: NEGATIVE
Spec Grav, UA: 1.02 (ref 1.010–1.025)
Urobilinogen, UA: 0.2 E.U./dL
pH, UA: 5 (ref 5.0–8.0)

## 2020-02-06 NOTE — Progress Notes (Signed)
ROB-Doing well except for increased pelvic pressure. Discussed home treatment measures. Drinking and eating protein daily to help with lower extremity swelling. Request SVE. Advised of GBS negative status, verbalized understanding. Anticipatory guidance regarding course of prenatal care. Reviewed red flag symptoms and when to call. RTC x 1 week for ROB or sooner if needed.

## 2020-02-06 NOTE — Patient Instructions (Signed)
Fetal Movement Counts Patient Name: ________________________________________________ Patient Due Date: ____________________ What is a fetal movement count?  A fetal movement count is the number of times that you feel your baby move during a certain amount of time. This may also be called a fetal kick count. A fetal movement count is recommended for every pregnant woman. You may be asked to start counting fetal movements as early as week 28 of your pregnancy. Pay attention to when your baby is most active. You may notice your baby's sleep and wake cycles. You may also notice things that make your baby move more. You should do a fetal movement count:  When your baby is normally most active.  At the same time each day. A good time to count movements is while you are resting, after having something to eat and drink. How do I count fetal movements? 1. Find a quiet, comfortable area. Sit, or lie down on your side. 2. Write down the date, the start time and stop time, and the number of movements that you felt between those two times. Take this information with you to your health care visits. 3. Write down your start time when you feel the first movement. 4. Count kicks, flutters, swishes, rolls, and jabs. You should feel at least 10 movements. 5. You may stop counting after you have felt 10 movements, or if you have been counting for 2 hours. Write down the stop time. 6. If you do not feel 10 movements in 2 hours, contact your health care provider for further instructions. Your health care provider may want to do additional tests to assess your baby's well-being. Contact a health care provider if:  You feel fewer than 10 movements in 2 hours.  Your baby is not moving like he or she usually does. Date: ____________ Start time: ____________ Stop time: ____________ Movements: ____________ Date: ____________ Start time: ____________ Stop time: ____________ Movements: ____________ Date: ____________  Start time: ____________ Stop time: ____________ Movements: ____________ Date: ____________ Start time: ____________ Stop time: ____________ Movements: ____________ Date: ____________ Start time: ____________ Stop time: ____________ Movements: ____________ Date: ____________ Start time: ____________ Stop time: ____________ Movements: ____________ Date: ____________ Start time: ____________ Stop time: ____________ Movements: ____________ Date: ____________ Start time: ____________ Stop time: ____________ Movements: ____________ Date: ____________ Start time: ____________ Stop time: ____________ Movements: ____________ This information is not intended to replace advice given to you by your health care provider. Make sure you discuss any questions you have with your health care provider. Document Revised: 03/28/2019 Document Reviewed: 03/28/2019 Elsevier Patient Education  2020 [employer] Inc[employer]  

## 2020-02-12 ENCOUNTER — Ambulatory Visit (INDEPENDENT_AMBULATORY_CARE_PROVIDER_SITE_OTHER): Payer: Medicaid Other | Admitting: Certified Nurse Midwife

## 2020-02-12 ENCOUNTER — Other Ambulatory Visit: Payer: Self-pay

## 2020-02-12 ENCOUNTER — Encounter: Payer: Self-pay | Admitting: Certified Nurse Midwife

## 2020-02-12 VITALS — BP 134/82 | HR 78 | Wt 170.1 lb

## 2020-02-12 DIAGNOSIS — Z3A38 38 weeks gestation of pregnancy: Secondary | ICD-10-CM

## 2020-02-12 LAB — POCT URINALYSIS DIPSTICK OB
Bilirubin, UA: NEGATIVE
Blood, UA: NEGATIVE
Glucose, UA: NEGATIVE
Ketones, UA: NEGATIVE
Leukocytes, UA: NEGATIVE
Nitrite, UA: NEGATIVE
POC,PROTEIN,UA: NEGATIVE
Spec Grav, UA: 1.015 (ref 1.010–1.025)
Urobilinogen, UA: 0.2 E.U./dL
pH, UA: 5 (ref 5.0–8.0)

## 2020-02-12 NOTE — Progress Notes (Signed)
ROB doing well, no complaints . Feels good movement. Labor precautions reviewed.SVE per pt request 3/50/-2. Follow up 1 wk with Sharyn Lull for [place].   Philip Aspen, CNM

## 2020-02-12 NOTE — Patient Instructions (Signed)
Braxton Hicks Contractions °Contractions of the uterus can occur throughout pregnancy, but they are not always a sign that you are in labor. You may have practice contractions called Braxton Hicks contractions. These false labor contractions are sometimes confused with true labor. °What are Braxton Hicks contractions? °Braxton Hicks contractions are tightening movements that occur in the muscles of the uterus before labor. Unlike true labor contractions, these contractions do not result in opening (dilation) and thinning of the cervix. Toward the end of pregnancy (32-34 weeks), Braxton Hicks contractions can happen more often and may become stronger. These contractions are sometimes difficult to tell apart from true labor because they can be very uncomfortable. You should not feel embarrassed if you go to the hospital with false labor. °Sometimes, the only way to tell if you are in true labor is for your health care provider to look for changes in the cervix. The health care provider will do a physical exam and may monitor your contractions. If you are not in true labor, the exam should show that your cervix is not dilating and your water has not broken. °If there are no other health problems associated with your pregnancy, it is completely safe for you to be sent home with false labor. You may continue to have Braxton Hicks contractions until you go into true labor. °How to tell the difference between true labor and false labor °True labor °· Contractions last 30-70 seconds. °· Contractions become very regular. °· Discomfort is usually felt in the top of the uterus, and it spreads to the lower abdomen and low back. °· Contractions do not go away with walking. °· Contractions usually become more intense and increase in frequency. °· The cervix dilates and gets thinner. °False labor °· Contractions are usually shorter and not as strong as true labor contractions. °· Contractions are usually irregular. °· Contractions  are often felt in the front of the lower abdomen and in the groin. °· Contractions may go away when you walk around or change positions while lying down. °· Contractions get weaker and are shorter-lasting as time goes on. °· The cervix usually does not dilate or become thin. °Follow these instructions at home: ° °· Take over-the-counter and prescription medicines only as told by your health care provider. °· Keep up with your usual exercises and follow other instructions from your health care provider. °· Eat and drink lightly if you think you are going into labor. °· If Braxton Hicks contractions are making you uncomfortable: °? Change your position from lying down or resting to walking, or change from walking to resting. °? Sit and rest in a tub of warm water. °? Drink enough fluid to keep your urine pale yellow. Dehydration may cause these contractions. °? Do slow and deep breathing several times an hour. °· Keep all follow-up prenatal visits as told by your health care provider. This is important. °Contact a health care provider if: °· You have a fever. °· You have continuous pain in your abdomen. °Get help right away if: °· Your contractions become stronger, more regular, and closer together. °· You have fluid leaking or gushing from your vagina. °· You pass blood-tinged mucus (bloody show). °· You have bleeding from your vagina. °· You have low back pain that you never had before. °· You feel your baby’s head pushing down and causing pelvic pressure. °· Your baby is not moving inside you as much as it used to. °Summary °· Contractions that occur before labor are   called Braxton Hicks contractions, false labor, or practice contractions. °· Braxton Hicks contractions are usually shorter, weaker, farther apart, and less regular than true labor contractions. True labor contractions usually become progressively stronger and regular, and they become more frequent. °· Manage discomfort from Braxton Hicks contractions  by changing position, resting in a warm bath, drinking plenty of water, or practicing deep breathing. °This information is not intended to replace advice given to you by your health care provider. Make sure you discuss any questions you have with your health care provider. °Document Revised: 07/21/2017 Document Reviewed: 12/22/2016 °Elsevier Patient Education © 2020 Elsevier Inc. ° °

## 2020-02-17 ENCOUNTER — Other Ambulatory Visit: Payer: Self-pay

## 2020-02-17 ENCOUNTER — Ambulatory Visit (INDEPENDENT_AMBULATORY_CARE_PROVIDER_SITE_OTHER): Payer: Medicaid Other | Admitting: Certified Nurse Midwife

## 2020-02-17 VITALS — BP 135/89 | HR 82 | Wt 170.5 lb

## 2020-02-17 DIAGNOSIS — Z3A39 39 weeks gestation of pregnancy: Secondary | ICD-10-CM

## 2020-02-17 DIAGNOSIS — Z3493 Encounter for supervision of normal pregnancy, unspecified, third trimester: Secondary | ICD-10-CM

## 2020-02-17 LAB — POCT URINALYSIS DIPSTICK OB
Bilirubin, UA: NEGATIVE
Blood, UA: NEGATIVE
Glucose, UA: NEGATIVE
Ketones, UA: NEGATIVE
Leukocytes, UA: NEGATIVE
Nitrite, UA: NEGATIVE
POC,PROTEIN,UA: NEGATIVE
Spec Grav, UA: 1.015 (ref 1.010–1.025)
Urobilinogen, UA: 0.2 E.U./dL
pH, UA: 6 (ref 5.0–8.0)

## 2020-02-17 NOTE — Patient Instructions (Signed)
Vaginal Delivery  Vaginal delivery means that you give birth by pushing your baby out of your birth canal (vagina). A team of health care providers will help you before, during, and after vaginal delivery. Birth experiences are unique for every woman and every pregnancy, and birth experiences vary depending on where you choose to give birth. What happens when I arrive at the birth center or hospital? Once you are in labor and have been admitted into the hospital or birth center, your health care provider may:  Review your pregnancy history and any concerns that you have.  Insert an IV into one of your veins. This may be used to give you fluids and medicines.  Check your blood pressure, pulse, temperature, and heart rate (vital signs).  Check whether your bag of water (amniotic sac) has broken (ruptured).  Talk with you about your birth plan and discuss pain control options. Monitoring Your health care provider may monitor your contractions (uterine monitoring) and your baby's heart rate (fetal monitoring). You may need to be monitored:  Often, but not continuously (intermittently).  All the time or for long periods at a time (continuously). Continuous monitoring may be needed if: ? You are taking certain medicines, such as medicine to relieve pain or make your contractions stronger. ? You have pregnancy or labor complications. Monitoring may be done by:  Placing a special stethoscope or a handheld monitoring device on your abdomen to check your baby's heartbeat and to check for contractions.  Placing monitors on your abdomen (external monitors) to record your baby's heartbeat and the frequency and length of contractions.  Placing monitors inside your uterus through your vagina (internal monitors) to record your baby's heartbeat and the frequency, length, and strength of your contractions. Depending on the type of monitor, it may remain in your uterus or on your baby's head until  birth.  Telemetry. This is a type of continuous monitoring that can be done with external or internal monitors. Instead of having to stay in bed, you are able to move around during telemetry. Physical exam Your health care provider may perform frequent physical exams. This may include:  Checking how and where your baby is positioned in your uterus.  Checking your cervix to determine: ? Whether it is thinning out (effacing). ? Whether it is opening up (dilating). What happens during labor and delivery?  Normal labor and delivery is divided into the following three stages: Stage 1  This is the longest stage of labor.  This stage can last for hours or days.  Throughout this stage, you will feel contractions. Contractions generally feel mild, infrequent, and irregular at first. They get stronger, more frequent (about every 2-3 minutes), and more regular as you move through this stage.  This stage ends when your cervix is completely dilated to 4 inches (10 cm) and completely effaced. Stage 2  This stage starts once your cervix is completely effaced and dilated and lasts until the delivery of your baby.  This stage may last from 20 minutes to 2 hours.  This is the stage where you will feel an urge to push your baby out of your vagina.  You may feel stretching and burning pain, especially when the widest part of your baby's head passes through the vaginal opening (crowning).  Once your baby is delivered, the umbilical cord will be clamped and cut. This usually occurs after waiting a period of 1-2 minutes after delivery.  Your baby will be placed on your bare chest (  skin-to-skin contact) in an upright position and covered with a warm blanket. Watch your baby for feeding cues, like rooting or sucking, and help the baby to your breast for his or her first feeding. Stage 3  This stage starts immediately after the birth of your baby and ends after you deliver the placenta.  This stage may  take anywhere from 5 to 30 minutes.  After your baby has been delivered, you will feel contractions as your body expels the placenta and your uterus contracts to control bleeding. What can I expect after labor and delivery?  After labor is over, you and your baby will be monitored closely until you are ready to go home to ensure that you are both healthy. Your health care team will teach you how to care for yourself and your baby.  You and your baby will stay in the same room (rooming in) during your hospital stay. This will encourage early bonding and successful breastfeeding.  You may continue to receive fluids and medicines through an IV.  Your uterus will be checked and massaged regularly (fundal massage).  You will have some soreness and pain in your abdomen, vagina, and the area of skin between your vaginal opening and your anus (perineum).  If an incision was made near your vagina (episiotomy) or if you had some vaginal tearing during delivery, cold compresses may be placed on your episiotomy or your tear. This helps to reduce pain and swelling.  You may be given a squirt bottle to use instead of wiping when you go to the bathroom. To use the squirt bottle, follow these steps: ? Before you urinate, fill the squirt bottle with warm water. Do not use hot water. ? After you urinate, while you are sitting on the toilet, use the squirt bottle to rinse the area around your urethra and vaginal opening. This rinses away any urine and blood. ? Fill the squirt bottle with clean water every time you use the bathroom.  It is normal to have vaginal bleeding after delivery. Wear a sanitary pad for vaginal bleeding and discharge. Summary  Vaginal delivery means that you will give birth by pushing your baby out of your birth canal (vagina).  Your health care provider may monitor your contractions (uterine monitoring) and your baby's heart rate (fetal monitoring).  Your health care provider may  perform a physical exam.  Normal labor and delivery is divided into three stages.  After labor is over, you and your baby will be monitored closely until you are ready to go home. This information is not intended to replace advice given to you by your health care provider. Make sure you discuss any questions you have with your health care provider. Document Revised: 09/12/2017 Document Reviewed: 09/12/2017 Elsevier Patient Education  2020 [employer] Inc[employer]    Fetal Movement Counts Patient Name: ________________________________________________ Patient Due Date: ____________________ What is a fetal movement count?  A fetal movement count is the number of times that you feel your baby move during a certain amount of time. This may also be called a fetal kick count. A fetal movement count is recommended for every pregnant woman. You may be asked to start counting fetal movements as early as week 28 of your pregnancy. Pay attention to when your baby is most active. You may notice your baby's sleep and wake cycles. You may also notice things that make your baby move more. You should do a fetal movement count:  When your baby is normally most   active.  At the same time each day. A good time to count movements is while you are resting, after having something to eat and drink. How do I count fetal movements? 1. Find a quiet, comfortable area. Sit, or lie down on your side. 2. Write down the date, the start time and stop time, and the number of movements that you felt between those two times. Take this information with you to your health care visits. 3. Write down your start time when you feel the first movement. 4. Count kicks, flutters, swishes, rolls, and jabs. You should feel at least 10 movements. 5. You may stop counting after you have felt 10 movements, or if you have been counting for 2 hours. Write down the stop time. 6. If you do not feel 10 movements in 2 hours, contact your health care  provider for further instructions. Your health care provider may want to do additional tests to assess your baby's well-being. Contact a health care provider if:  You feel fewer than 10 movements in 2 hours.  Your baby is not moving like he or she usually does. Date: ____________ Start time: ____________ Stop time: ____________ Movements: ____________ Date: ____________ Start time: ____________ Stop time: ____________ Movements: ____________ Date: ____________ Start time: ____________ Stop time: ____________ Movements: ____________ Date: ____________ Start time: ____________ Stop time: ____________ Movements: ____________ Date: ____________ Start time: ____________ Stop time: ____________ Movements: ____________ Date: ____________ Start time: ____________ Stop time: ____________ Movements: ____________ Date: ____________ Start time: ____________ Stop time: ____________ Movements: ____________ Date: ____________ Start time: ____________ Stop time: ____________ Movements: ____________ Date: ____________ Start time: ____________ Stop time: ____________ Movements: ____________ This information is not intended to replace advice given to you by your health care provider. Make sure you discuss any questions you have with your health care provider. Document Revised: 03/28/2019 Document Reviewed: 03/28/2019 Elsevier Patient Education  2020 [employer] Inc.  

## 2020-02-19 ENCOUNTER — Ambulatory Visit (INDEPENDENT_AMBULATORY_CARE_PROVIDER_SITE_OTHER): Payer: Medicaid Other | Admitting: Certified Nurse Midwife

## 2020-02-19 ENCOUNTER — Other Ambulatory Visit: Payer: Self-pay

## 2020-02-19 VITALS — BP 124/75 | HR 73 | Wt 171.4 lb

## 2020-02-19 DIAGNOSIS — Z3A39 39 weeks gestation of pregnancy: Secondary | ICD-10-CM

## 2020-02-19 LAB — POCT URINALYSIS DIPSTICK OB
Bilirubin, UA: NEGATIVE
Blood, UA: NEGATIVE
Glucose, UA: NEGATIVE
Ketones, UA: NEGATIVE
Leukocytes, UA: NEGATIVE
Nitrite, UA: NEGATIVE
POC,PROTEIN,UA: NEGATIVE
Spec Grav, UA: 1.01 (ref 1.010–1.025)
Urobilinogen, UA: 0.2 E.U./dL
pH, UA: 5 (ref 5.0–8.0)

## 2020-02-19 NOTE — Progress Notes (Signed)
Rob doing well , feels good movement. Discussed u/s next visit for growth and AFI due to post dates. Discussed reamainder of [place]. Labor precautions reviewed. Follow up u/s and ROB.   Philip Aspen, CNM

## 2020-02-19 NOTE — Patient Instructions (Signed)
Braxton Hicks Contractions °Contractions of the uterus can occur throughout pregnancy, but they are not always a sign that you are in labor. You may have practice contractions called Braxton Hicks contractions. These false labor contractions are sometimes confused with true labor. °What are Braxton Hicks contractions? °Braxton Hicks contractions are tightening movements that occur in the muscles of the uterus before labor. Unlike true labor contractions, these contractions do not result in opening (dilation) and thinning of the cervix. Toward the end of pregnancy (32-34 weeks), Braxton Hicks contractions can happen more often and may become stronger. These contractions are sometimes difficult to tell apart from true labor because they can be very uncomfortable. You should not feel embarrassed if you go to the hospital with false labor. °Sometimes, the only way to tell if you are in true labor is for your health care provider to look for changes in the cervix. The health care provider will do a physical exam and may monitor your contractions. If you are not in true labor, the exam should show that your cervix is not dilating and your water has not broken. °If there are no other health problems associated with your pregnancy, it is completely safe for you to be sent home with false labor. You may continue to have Braxton Hicks contractions until you go into true labor. °How to tell the difference between true labor and false labor °True labor °· Contractions last 30-70 seconds. °· Contractions become very regular. °· Discomfort is usually felt in the top of the uterus, and it spreads to the lower abdomen and low back. °· Contractions do not go away with walking. °· Contractions usually become more intense and increase in frequency. °· The cervix dilates and gets thinner. °False labor °· Contractions are usually shorter and not as strong as true labor contractions. °· Contractions are usually irregular. °· Contractions  are often felt in the front of the lower abdomen and in the groin. °· Contractions may go away when you walk around or change positions while lying down. °· Contractions get weaker and are shorter-lasting as time goes on. °· The cervix usually does not dilate or become thin. °Follow these instructions at home: ° °· Take over-the-counter and prescription medicines only as told by your health care provider. °· Keep up with your usual exercises and follow other instructions from your health care provider. °· Eat and drink lightly if you think you are going into labor. °· If Braxton Hicks contractions are making you uncomfortable: °? Change your position from lying down or resting to walking, or change from walking to resting. °? Sit and rest in a tub of warm water. °? Drink enough fluid to keep your urine pale yellow. Dehydration may cause these contractions. °? Do slow and deep breathing several times an hour. °· Keep all follow-up prenatal visits as told by your health care provider. This is important. °Contact a health care provider if: °· You have a fever. °· You have continuous pain in your abdomen. °Get help right away if: °· Your contractions become stronger, more regular, and closer together. °· You have fluid leaking or gushing from your vagina. °· You pass blood-tinged mucus (bloody show). °· You have bleeding from your vagina. °· You have low back pain that you never had before. °· You feel your baby’s head pushing down and causing pelvic pressure. °· Your baby is not moving inside you as much as it used to. °Summary °· Contractions that occur before labor are   called Braxton Hicks contractions, false labor, or practice contractions. °· Braxton Hicks contractions are usually shorter, weaker, farther apart, and less regular than true labor contractions. True labor contractions usually become progressively stronger and regular, and they become more frequent. °· Manage discomfort from Braxton Hicks contractions  by changing position, resting in a warm bath, drinking plenty of water, or practicing deep breathing. °This information is not intended to replace advice given to you by your health care provider. Make sure you discuss any questions you have with your health care provider. °Document Revised: 07/21/2017 Document Reviewed: 12/22/2016 °Elsevier Patient Education © 2020 Elsevier Inc. ° °

## 2020-02-21 ENCOUNTER — Inpatient Hospital Stay
Admission: EM | Admit: 2020-02-21 | Discharge: 2020-02-23 | DRG: 798 | Disposition: A | Discharge status: Home / Self Care | Payer: Medicaid Other | Attending: Certified Nurse Midwife | Admitting: Certified Nurse Midwife

## 2020-02-21 ENCOUNTER — Encounter: Payer: Self-pay | Admitting: Obstetrics and Gynecology

## 2020-02-21 ENCOUNTER — Other Ambulatory Visit: Payer: Self-pay

## 2020-02-21 DIAGNOSIS — Z302 Encounter for sterilization: Secondary | ICD-10-CM

## 2020-02-21 DIAGNOSIS — Z6791 Unspecified blood type, Rh negative: Secondary | ICD-10-CM

## 2020-02-21 DIAGNOSIS — O26893 Other specified pregnancy related conditions, third trimester: Principal | ICD-10-CM | POA: Diagnosis present

## 2020-02-21 DIAGNOSIS — Z20822 Contact with and (suspected) exposure to covid-19: Secondary | ICD-10-CM | POA: Diagnosis present

## 2020-02-21 DIAGNOSIS — Z3A40 40 weeks gestation of pregnancy: Secondary | ICD-10-CM | POA: Diagnosis not present

## 2020-02-21 DIAGNOSIS — Z87891 Personal history of nicotine dependence: Secondary | ICD-10-CM | POA: Diagnosis not present

## 2020-02-21 LAB — SARS CORONAVIRUS 2 BY RT PCR (HOSPITAL ORDER, PERFORMED IN ~~LOC~~ HOSPITAL LAB): SARS Coronavirus 2: NEGATIVE

## 2020-02-21 MED ORDER — SOD CITRATE-CITRIC ACID 500-334 MG/5ML PO SOLN: 30.0000 mL | ORAL | Status: DC | PRN

## 2020-02-21 MED ORDER — OXYTOCIN BOLUS FROM INFUSION: 333.0000 mL | Freq: Once | INTRAVENOUS | Status: DC

## 2020-02-21 MED ORDER — ONDANSETRON HCL 4 MG/2ML IJ SOLN: 4.0000 mg | Freq: Four times a day (QID) | INTRAMUSCULAR | Status: DC | PRN

## 2020-02-21 MED ORDER — OXYCODONE-ACETAMINOPHEN 5-325 MG PO TABS: 1.0000 | ORAL_TABLET | ORAL | Status: DC | PRN

## 2020-02-21 MED ORDER — OXYCODONE-ACETAMINOPHEN 5-325 MG PO TABS: 2.0000 | ORAL_TABLET | ORAL | Status: DC | PRN

## 2020-02-21 MED ORDER — LIDOCAINE HCL (PF) 1 % IJ SOLN: 30.0000 mL | INTRAMUSCULAR | Status: DC | PRN

## 2020-02-21 MED ORDER — LACTATED RINGERS IV SOLN
500.0000 mL | INTRAVENOUS | Status: DC | PRN
  Administered 2020-02-22: 1000 mL via INTRAVENOUS

## 2020-02-21 MED ORDER — OXYTOCIN-SODIUM CHLORIDE 30-0.9 UT/500ML-% IV SOLN
2.5000 [IU]/h | INTRAVENOUS | Status: DC
  Filled 2020-02-21: qty 500

## 2020-02-21 MED ORDER — FENTANYL CITRATE (PF) 100 MCG/2ML IJ SOLN
50.0000 ug | INTRAMUSCULAR | Status: DC | PRN
  Filled 2020-02-21: qty 2

## 2020-02-21 MED ORDER — ACETAMINOPHEN 325 MG PO TABS: 650.0000 mg | ORAL_TABLET | ORAL | Status: DC | PRN

## 2020-02-21 MED ORDER — LACTATED RINGERS IV SOLN: INTRAVENOUS | Status: DC

## 2020-02-21 NOTE — OB Triage Note (Signed)
Pt presents to L&D with c/o contractions q 8-10 min. Denies LOF, Vag Bleeding, or decresaed fetal movements. EFM asnd toco applied and explained. Plan to monitor fetal and maternal well being and assess for labor.

## 2020-02-22 ENCOUNTER — Inpatient Hospital Stay: Payer: Medicaid Other | Admitting: Anesthesiology

## 2020-02-22 ENCOUNTER — Other Ambulatory Visit: Payer: Self-pay

## 2020-02-22 ENCOUNTER — Encounter: Payer: Self-pay | Admitting: Certified Nurse Midwife

## 2020-02-22 ENCOUNTER — Encounter
Admission: EM | Disposition: A | Discharge status: Home / Self Care | Payer: Self-pay | Source: Home / Self Care | Attending: Certified Nurse Midwife

## 2020-02-22 DIAGNOSIS — Z3A40 40 weeks gestation of pregnancy: Secondary | ICD-10-CM

## 2020-02-22 DIAGNOSIS — Z302 Encounter for sterilization: Secondary | ICD-10-CM

## 2020-02-22 HISTORY — PX: TUBAL LIGATION: SHX77

## 2020-02-22 LAB — TYPE AND SCREEN
ABO/RH(D): O NEG
Antibody Screen: NEGATIVE

## 2020-02-22 LAB — CBC
HCT: 36.3 % (ref 36.0–46.0)
Hemoglobin: 12.4 g/dL (ref 12.0–15.0)
MCH: 29 pg (ref 26.0–34.0)
MCHC: 34.2 g/dL (ref 30.0–36.0)
MCV: 84.8 fL (ref 80.0–100.0)
Platelets: 209 10*3/uL (ref 150–400)
RBC: 4.28 MIL/uL (ref 3.87–5.11)
RDW: 13.4 % (ref 11.5–15.5)
WBC: 10.5 10*3/uL (ref 4.0–10.5)
nRBC: 0 % (ref 0.0–0.2)

## 2020-02-22 LAB — RPR: RPR Ser Ql: NONREACTIVE

## 2020-02-22 SURGERY — LIGATION, FALLOPIAN TUBE, POSTPARTUM
Anesthesia: General | Site: Abdomen | Laterality: Bilateral

## 2020-02-22 MED ORDER — SUCCINYLCHOLINE CHLORIDE 20 MG/ML IJ SOLN
INTRAMUSCULAR | Status: DC | PRN
  Administered 2020-02-22: 100 mg via INTRAVENOUS

## 2020-02-22 MED ORDER — ACETAMINOPHEN 325 MG PO TABS
650.0000 mg | ORAL_TABLET | ORAL | Status: DC | PRN
  Administered 2020-02-22 – 2020-02-23 (×3): 650 mg via ORAL
  Filled 2020-02-22 (×3): qty 2

## 2020-02-22 MED ORDER — ONDANSETRON HCL 4 MG/2ML IJ SOLN: 4.0000 mg | INTRAMUSCULAR | Status: DC | PRN

## 2020-02-22 MED ORDER — DEXAMETHASONE SODIUM PHOSPHATE 10 MG/ML IJ SOLN
INTRAMUSCULAR | Status: DC | PRN
  Administered 2020-02-22: 4 mg via INTRAVENOUS

## 2020-02-22 MED ORDER — LIDOCAINE HCL (CARDIAC) PF 100 MG/5ML IV SOSY
PREFILLED_SYRINGE | INTRAVENOUS | Status: DC | PRN
  Administered 2020-02-22: 60 mg via INTRAVENOUS

## 2020-02-22 MED ORDER — OXYCODONE HCL 5 MG PO TABS: 5.0000 mg | ORAL_TABLET | Freq: Once | ORAL | Status: DC | PRN

## 2020-02-22 MED ORDER — DIPHENHYDRAMINE HCL 25 MG PO CAPS: 25.0000 mg | ORAL_CAPSULE | Freq: Four times a day (QID) | ORAL | Status: DC | PRN

## 2020-02-22 MED ORDER — PHENYLEPHRINE 40 MCG/ML (10ML) SYRINGE FOR IV PUSH (FOR BLOOD PRESSURE SUPPORT): 80.0000 ug | PREFILLED_SYRINGE | INTRAVENOUS | Status: DC | PRN

## 2020-02-22 MED ORDER — MISOPROSTOL 200 MCG PO TABS
ORAL_TABLET | ORAL | Status: AC
  Filled 2020-02-22: qty 4

## 2020-02-22 MED ORDER — COCONUT OIL OIL
1.0000 "application " | TOPICAL_OIL | Status: DC | PRN
  Administered 2020-02-23: 1 via TOPICAL
  Filled 2020-02-22: qty 120

## 2020-02-22 MED ORDER — SIMETHICONE 80 MG PO CHEW
80.0000 mg | CHEWABLE_TABLET | ORAL | Status: DC | PRN
  Administered 2020-02-22 – 2020-02-23 (×6): 80 mg via ORAL
  Filled 2020-02-22 (×6): qty 1

## 2020-02-22 MED ORDER — FAMOTIDINE 20 MG PO TABS
40.0000 mg | ORAL_TABLET | Freq: Once | ORAL | Status: AC
  Administered 2020-02-22: 40 mg via ORAL

## 2020-02-22 MED ORDER — FENTANYL 2.5 MCG/ML W/ROPIVACAINE 0.15% IN NS 100 ML EPIDURAL (ARMC)
EPIDURAL | Status: AC
  Filled 2020-02-22: qty 100

## 2020-02-22 MED ORDER — BUPIVACAINE HCL (PF) 0.5 % IJ SOLN
INTRAMUSCULAR | Status: AC
  Filled 2020-02-22: qty 30

## 2020-02-22 MED ORDER — OXYTOCIN 10 UNIT/ML IJ SOLN
INTRAMUSCULAR | Status: AC
  Filled 2020-02-22: qty 2

## 2020-02-22 MED ORDER — EPHEDRINE SULFATE 50 MG/ML IJ SOLN
INTRAMUSCULAR | Status: DC | PRN
  Administered 2020-02-22: 5 mg via INTRAVENOUS

## 2020-02-22 MED ORDER — METOCLOPRAMIDE HCL 10 MG PO TABS
10.0000 mg | ORAL_TABLET | Freq: Once | ORAL | Status: AC
  Administered 2020-02-22: 10 mg via ORAL

## 2020-02-22 MED ORDER — SODIUM CHLORIDE 0.9% FLUSH
3.0000 mL | Freq: Two times a day (BID) | INTRAVENOUS | Status: DC
  Administered 2020-02-23: 3 mL via INTRAVENOUS

## 2020-02-22 MED ORDER — IBUPROFEN 600 MG PO TABS
600.0000 mg | ORAL_TABLET | Freq: Four times a day (QID) | ORAL | Status: DC
  Administered 2020-02-22 – 2020-02-23 (×5): 600 mg via ORAL
  Filled 2020-02-22 (×5): qty 1

## 2020-02-22 MED ORDER — BENZOCAINE-MENTHOL 20-0.5 % EX AERO: 1.0000 "application " | INHALATION_SPRAY | CUTANEOUS | Status: DC | PRN

## 2020-02-22 MED ORDER — SUGAMMADEX SODIUM 500 MG/5ML IV SOLN
INTRAVENOUS | Status: DC | PRN
  Administered 2020-02-22: 200 mg via INTRAVENOUS

## 2020-02-22 MED ORDER — EPHEDRINE 5 MG/ML INJ: 10.0000 mg | INTRAVENOUS | Status: DC | PRN

## 2020-02-22 MED ORDER — FENTANYL CITRATE (PF) 100 MCG/2ML IJ SOLN
INTRAMUSCULAR | Status: AC
  Filled 2020-02-22: qty 2

## 2020-02-22 MED ORDER — LIDOCAINE-EPINEPHRINE (PF) 1.5 %-1:200000 IJ SOLN
INTRAMUSCULAR | Status: DC | PRN
  Administered 2020-02-22: 4 mL via PERINEURAL

## 2020-02-22 MED ORDER — LIDOCAINE HCL (PF) 1 % IJ SOLN
INTRAMUSCULAR | Status: AC
  Filled 2020-02-22: qty 30

## 2020-02-22 MED ORDER — BUPIVACAINE HCL 0.5 % IJ SOLN
INTRAMUSCULAR | Status: DC | PRN
  Administered 2020-02-22: 20 mL

## 2020-02-22 MED ORDER — LACTATED RINGERS IV SOLN: 500.0000 mL | Freq: Once | INTRAVENOUS | Status: DC

## 2020-02-22 MED ORDER — FENTANYL 2.5 MCG/ML W/ROPIVACAINE 0.15% IN NS 100 ML EPIDURAL (ARMC)
12.0000 mL/h | EPIDURAL | Status: DC
  Administered 2020-02-22: 12 mL/h via EPIDURAL

## 2020-02-22 MED ORDER — SODIUM CHLORIDE 0.9% FLUSH
3.0000 mL | INTRAVENOUS | Status: DC | PRN
  Administered 2020-02-23: 3 mL via INTRAVENOUS

## 2020-02-22 MED ORDER — ONDANSETRON HCL 4 MG/2ML IJ SOLN
INTRAMUSCULAR | Status: DC | PRN
  Administered 2020-02-22: 4 mg via INTRAVENOUS

## 2020-02-22 MED ORDER — PRENATAL MULTIVITAMIN CH
1.0000 | ORAL_TABLET | Freq: Every day | ORAL | Status: DC
  Administered 2020-02-22 – 2020-02-23 (×2): 1 via ORAL
  Filled 2020-02-22 (×2): qty 1

## 2020-02-22 MED ORDER — BUPIVACAINE HCL (PF) 0.25 % IJ SOLN
INTRAMUSCULAR | Status: DC | PRN
  Administered 2020-02-22: 3 mL via EPIDURAL

## 2020-02-22 MED ORDER — DIPHENHYDRAMINE HCL 50 MG/ML IJ SOLN: 12.5000 mg | INTRAMUSCULAR | Status: DC | PRN

## 2020-02-22 MED ORDER — FENTANYL CITRATE (PF) 100 MCG/2ML IJ SOLN
INTRAMUSCULAR | Status: DC | PRN
  Administered 2020-02-22: 100 ug via INTRAVENOUS

## 2020-02-22 MED ORDER — SENNOSIDES-DOCUSATE SODIUM 8.6-50 MG PO TABS
2.0000 | ORAL_TABLET | ORAL | Status: DC
  Administered 2020-02-22: 2 via ORAL
  Filled 2020-02-22: qty 2

## 2020-02-22 MED ORDER — WITCH HAZEL-GLYCERIN EX PADS: 1.0000 "application " | MEDICATED_PAD | CUTANEOUS | Status: DC | PRN

## 2020-02-22 MED ORDER — HYDROCODONE-ACETAMINOPHEN 5-325 MG PO TABS
1.0000 | ORAL_TABLET | Freq: Four times a day (QID) | ORAL | Status: DC | PRN
  Administered 2020-02-22 – 2020-02-23 (×3): 2 via ORAL
  Filled 2020-02-22 (×3): qty 2

## 2020-02-22 MED ORDER — MIDAZOLAM HCL 2 MG/2ML IJ SOLN
INTRAMUSCULAR | Status: DC | PRN
  Administered 2020-02-22: 2 mg via INTRAVENOUS

## 2020-02-22 MED ORDER — OXYCODONE HCL 5 MG/5ML PO SOLN: 5.0000 mg | Freq: Once | ORAL | Status: DC | PRN

## 2020-02-22 MED ORDER — LIDOCAINE HCL (PF) 1 % IJ SOLN
INTRAMUSCULAR | Status: DC | PRN
  Administered 2020-02-22: 4 mL via SUBCUTANEOUS

## 2020-02-22 MED ORDER — PROPOFOL 10 MG/ML IV BOLUS
INTRAVENOUS | Status: DC | PRN
  Administered 2020-02-22: 200 mg via INTRAVENOUS

## 2020-02-22 MED ORDER — DIBUCAINE (PERIANAL) 1 % EX OINT: 1.0000 "application " | TOPICAL_OINTMENT | CUTANEOUS | Status: DC | PRN

## 2020-02-22 MED ORDER — ONDANSETRON HCL 4 MG PO TABS: 4.0000 mg | ORAL_TABLET | ORAL | Status: DC | PRN

## 2020-02-22 MED ORDER — FENTANYL CITRATE (PF) 100 MCG/2ML IJ SOLN
25.0000 ug | INTRAMUSCULAR | Status: DC | PRN
  Administered 2020-02-22 (×2): 50 ug via INTRAVENOUS

## 2020-02-22 MED ORDER — LACTATED RINGERS IV SOLN: INTRAVENOUS | Status: DC

## 2020-02-22 MED ORDER — SODIUM CHLORIDE 0.9 % IV SOLN: 250.0000 mL | INTRAVENOUS | Status: DC | PRN

## 2020-02-22 MED ORDER — ROCURONIUM BROMIDE 100 MG/10ML IV SOLN
INTRAVENOUS | Status: DC | PRN
  Administered 2020-02-22: 30 mg via INTRAVENOUS

## 2020-02-22 MED ORDER — AMMONIA AROMATIC IN INHA
RESPIRATORY_TRACT | Status: AC
  Filled 2020-02-22: qty 10

## 2020-02-22 SURGICAL SUPPLY — 24 items
BLADE SURG SZ11 CARB STEEL (BLADE) ×2 IMPLANT
CHLORAPREP W/TINT 26 (MISCELLANEOUS) ×2 IMPLANT
COVER WAND RF STERILE (DRAPES) IMPLANT
DERMABOND ADVANCED (GAUZE/BANDAGES/DRESSINGS) ×1
DERMABOND ADVANCED .7 DNX12 (GAUZE/BANDAGES/DRESSINGS) ×1 IMPLANT
DRAPE LAPAROTOMY 100X77 ABD (DRAPES) ×2 IMPLANT
GLOVE BIO SURGEON STRL SZ 6.5 (GLOVE) ×2 IMPLANT
GLOVE INDICATOR 7.0 STRL GRN (GLOVE) ×2 IMPLANT
GOWN STRL REUS W/ TWL LRG LVL3 (GOWN DISPOSABLE) ×2 IMPLANT
GOWN STRL REUS W/TWL LRG LVL3 (GOWN DISPOSABLE) ×2
KIT TURNOVER CYSTO (KITS) ×2 IMPLANT
NEEDLE HYPO 25GX1X1/2 BEV (NEEDLE) ×2 IMPLANT
NS IRRIG 500ML POUR BTL (IV SOLUTION) ×2 IMPLANT
PACK BASIN MINOR (MISCELLANEOUS) ×2 IMPLANT
SUT MNCRL 4-0 (SUTURE) ×1
SUT MNCRL 4-0 27XMFL (SUTURE) ×1
SUT PLAIN GUT 0 (SUTURE) ×4 IMPLANT
SUT VIC AB 0 CT1 36 (SUTURE) IMPLANT
SUT VIC AB 0 SH 27 (SUTURE) IMPLANT
SUT VIC AB 3-0 SH 27 (SUTURE) ×1
SUT VIC AB 3-0 SH 27X BRD (SUTURE) ×1 IMPLANT
SUT VICRYL 0 AB UR-6 (SUTURE) ×4 IMPLANT
SUTURE MNCRL 4-0 27XMF (SUTURE) ×1 IMPLANT
SYR 10ML LL (SYRINGE) ×2 IMPLANT

## 2020-02-22 NOTE — Progress Notes (Signed)
Patient presents for membranes sweep after starting herbal prep. Reports irregular contractions. Desires induction of labor. Discussed labor preparation techniques. [employer] provided. Advised to repeat sweep at [facility] scheduled later this week. Reviewed red flag symptoms and when to call. RTC as previously scheduled or sooner if needed.

## 2020-02-22 NOTE — Anesthesia Preprocedure Evaluation (Addendum)
Anesthesia Evaluation  Patient identified by MRN, date of birth, ID band Patient awake    Reviewed: Allergy & Precautions, H&P , NPO status , Patient's Chart, lab work & pertinent test results  History of Anesthesia Complications Negative for: history of anesthetic complications  Airway Mallampati: III  TM Distance: >3 FB Neck ROM: full    Dental  (+) Chipped   Pulmonary neg shortness of breath, former smoker,    Pulmonary exam normal        Cardiovascular Exercise Tolerance: Good (-) hypertensionnegative cardio ROS Normal cardiovascular exam     Neuro/Psych PSYCHIATRIC DISORDERS negative neurological ROS  negative psych ROS   GI/Hepatic negative GI ROS, Neg liver ROS, neg GERD  ,  Endo/Other  negative endocrine ROS  Renal/GU      Musculoskeletal   Abdominal   Peds  Hematology negative hematology ROS (+)   Anesthesia Other Findings Past Medical History: No date: Anxiety No date: Vegetarian  History reviewed. No pertinent surgical history.  BMI    Body Mass Index: 27.60 kg/m      Reproductive/Obstetrics negative OB ROS                             Anesthesia Physical Anesthesia Plan  ASA: II  Anesthesia Plan: General ETT and Rapid Sequence   Post-op Pain Management:    Induction: Intravenous  PONV Risk Score and Plan: Ondansetron, Dexamethasone, Midazolam and Treatment may vary due to age or medical condition  Airway Management Planned: Oral ETT  Additional Equipment:   Intra-op Plan:   Post-operative Plan: Extubation in OR  Informed Consent: I have reviewed the patients History and Physical, chart, labs and discussed the procedure including the risks, benefits and alternatives for the proposed anesthesia with the patient or authorized representative who has indicated his/her understanding and acceptance.     Dental Advisory Given  Plan Discussed with:  Anesthesiologist, CRNA and Surgeon  Anesthesia Plan Comments: (Patient had a vaginal delivery this morning.  I spoke with Dr. Marcelline Mates about the timing of this procedure.  Dr. Marcelline Mates stopped the pitocin infusion before this procedure as the patients post partum vaginal bleeding had stopped per her report.  We will proceed with this case since the patient meets normal criteria for an elective procedure.    Patient consented for risks of anesthesia including but not limited to:  - adverse reactions to medications - damage to eyes, teeth, lips or other oral mucosa - nerve damage due to positioning  - sore throat or hoarseness - Damage to heart, brain, nerves, lungs, other parts of body or loss of life  Patient voiced understanding.)       Anesthesia Quick Evaluation

## 2020-02-22 NOTE — H&P (Signed)
Obstetric History and Physical  Tina Hughes is a 29 y.o. [id number] with IUP at [redacted]w[redacted]d presenting with regular contractions.   Patient states she has been having  regular, every two (2) to six (6) minutes contractions, none vaginal bleeding, intact membranes, with active fetal movement.    Denies difficulty breathing or respiratory distress, chest pain, abdominal pain, dysuria, and leg pain or swelling.   Prenatal Course  Source of Care: [facility]-initial visit at 7 weeks, total visits: 15  Pregnancy complications or risks: Rh negative, History of gestational hypertension in previous pregnancy, History of anxiety  Prenatal labs and studies:  ABO, Rh: --/--/O NEG (07/03 0003)  Antibody: NEG (07/03 0003)  Rubella: 2.74 (11/19 1031)  Varicella: 314 (11/19 1031)  RPR: Non Reactive (04/13 0900)   HBsAg: Negative (11/19 1031)   HIV: Non Reactive (11/19 1031)   HFW:YOVZCHYI/-- (06/09 1344)  1 hr Glucola: 130 (04/13 0900)  Genetic screening: Low risk female (12/11 1504)  Anatomy US: Complete, normal (02/17 5027)  Past Medical History:  Diagnosis Date  . Anxiety   . Vegetarian     History reviewed. No pertinent surgical history.  OB History  Gravida Para Term Preterm AB Living  3 2 2     2   SAB TAB Ectopic Multiple Live Births        0 2    # Outcome Date GA Lbr Len/2nd Weight Sex Delivery Anes PTL Lv  3 Current           2 Term 10/14/17 [redacted]w[redacted]d 03:14 / 00:26 3560 g F Vag-Spont EPI N LIV  1 Term 11/09/14   3374 g M Vag-Spont EPI N LIV    Social History   Socioeconomic History  . Marital status: Single    Spouse name: Not on file  . Number of children: Not on file  . Years of education: Not on file  . Highest education level: Not on file  Occupational History  . Not on file  Tobacco Use  . Smoking status: Former Research scientist (life sciences)  . Smokeless tobacco: Never Used  Vaping Use  . Vaping Use: Never used  Substance and Sexual Activity  . Alcohol use: Not Currently     Comment: occasional  . Drug use: No  . Sexual activity: Yes    Birth control/protection: None  Other Topics Concern  . Not on file  Social History Narrative  . Not on file   Social Determinants of Health   Financial Resource Strain:   . Difficulty of Paying Living Expenses:   Food Insecurity:   . Worried About Charity fundraiser in the Last Year:   . Arboriculturist in the Last Year:   Transportation Needs:   . Film/video editor (Medical):   Marland Kitchen Lack of Transportation (Non-Medical):   Physical Activity:   . Days of Exercise per Week:   . Minutes of Exercise per Session:   Stress:   . Feeling of Stress :   Social Connections:   . Frequency of Communication with Friends and Family:   . Frequency of Social Gatherings with Friends and Family:   . Attends Religious Services:   . Active Member of Clubs or Organizations:   . Attends Archivist Meetings:   Marland Kitchen Marital Status:     Family History  Problem Relation Age of Onset  . Cancer Paternal Grandmother        skin cancer  . Irritable bowel syndrome Mother   . Breast  cancer Neg Hx   . Ovarian cancer Neg Hx   . Colon cancer Neg Hx     Medications Prior to Admission  Medication Sig Dispense Refill Last Dose  . prenatal vitamin w/FE, FA (PRENATAL 1 + 1) 27-1 MG TABS tablet Take 1 tablet by mouth daily at 12 noon.   02/21/2020 at Unknown time    No Known Allergies  Review of Systems: Negative except for what is mentioned in HPI.  Physical Exam:  Temp:  [99 F (37.2 C)] 99 F (37.2 C) (07/02 2118) Pulse Rate:  [70] 70 (07/02 2118) Resp:  [18] 18 (07/02 2118) BP: (120)/(79) 120/79 (07/02 2118) Weight:  [77.6 kg] 77.6 kg (07/02 2118)  GENERAL: Well-developed, well-nourished female in no acute distress.   LUNGS: Clear to auscultation bilaterally.   HEART: Regular rate and rhythm.  ABDOMEN: Soft, nontender, nondistended, gravid.  EXTREMITIES: Nontender, no edema, 2+ distal pulses.  Cervical Exam:  6/90/-3, vertex per RN  FHR Category I  Contractions: Every two (2) to six (6) minutes, soft resting tone   Pertinent Labs/Studies:   Results for orders placed or performed during the hospital encounter of 02/21/20 (from the past 24 hour(s))  SARS Coronavirus 2 by RT PCR (hospital order, performed in [facility] hospital lab) Nasopharyngeal Nasopharyngeal Swab     Status: None   Collection Time: 02/21/20 10:25 PM   Specimen: Nasopharyngeal Swab  Result Value Ref Range   SARS Coronavirus 2 NEGATIVE NEGATIVE  CBC     Status: None   Collection Time: 02/22/20 12:03 AM  Result Value Ref Range   WBC 10.5 4.0 - 10.5 K/uL   RBC 4.28 3.87 - 5.11 MIL/uL   Hemoglobin 12.4 12.0 - 15.0 g/dL   HCT 36.3 36 - 46 %   MCV 84.8 80.0 - 100.0 fL   MCH 29.0 26.0 - 34.0 pg   MCHC 34.2 30.0 - 36.0 g/dL   RDW 13.4 11.5 - 15.5 %   Platelets 209 150 - 400 K/uL   nRBC 0.0 0.0 - 0.2 %  Type and screen [place]     Status: None   Collection Time: 02/22/20 12:03 AM  Result Value Ref Range   ABO/RH(D) O NEG    Antibody Screen NEG    Sample Expiration      02/25/2020,2359 Performed at [facility], [address], [place], [place] [address]     Assessment :  Tina Hughes is a 29 y.o. [id number] at [redacted]w[redacted]d being admitted for labor, Rh negative, GBS negative, History of gestational hypertension in previous pregnancy, History of anxiety  FHR Category I  Plan:  Admit to birthing suites, see orders.   Labor: Expectant management.  Induction/Augmentation as needed, per protocol.  Delivery plan: Hopeful for vaginal birth.   Dr. Marcelline Mates notified of admission and plan of care.    Diona Fanti, CNM [facility], [facility] 02/22/20 2:03 AM

## 2020-02-22 NOTE — Progress Notes (Signed)
OBSTETRICS AND GYNECOLOGY PRE-OPERATIVE NOTE   Pre-Op Diagnosis: Multiparity, desiring permanent sterilization  Planned Procedure: Postpartum tubal ligation  Surgeons: Rubie Maid, MD  Anesthesia: General  Blood: Type and screened  Labs:  CBC Latest Ref Rng & Units 02/22/2020 12/03/2019 08/02/2019  WBC 4.0 - 10.5 K/uL 10.5 6.5 7.9  Hemoglobin 12.0 - 15.0 g/dL 12.4 11.5 12.8  Hematocrit 36 - 46 % 36.3 34.3 37.9  Platelets 150 - 400 K/uL 209 194 229    Lab Results  Component Value Date   ABORH O NEG 02/22/2020    Antibiotics: None Needed   Consent: Signed and on chart   Pre-Op BTL Consent Consent: Surgical consent and tubal sterilization. Alternatives to sterilizations are documented on the surgical consent that has been signed by the patient. Patient confirms that she has been counseled about permanent sterilization on mutiple occasions during her antepartum course and during this admission. She understands the alternatives to permanents include: oral contraceptive pills, depot provera, patch, ring, intrauterine device (5 year or 10 year), Implanon, condoms and cervical caps/diaphram.  She states that she desires the procedure.   Rubie Maid, MD [facility]

## 2020-02-22 NOTE — Progress Notes (Signed)
Pt requests epidural 0000 Dr Andree Elk notified 0017 MD at [facility] 0029 Epidural placed 0030 Test dose given Maternal HR 83 0035 Bolus 1 given 0040 Bolus 2 Given  0040 Drip started

## 2020-02-22 NOTE — Anesthesia Postprocedure Evaluation (Signed)
Anesthesia Post Note  Patient: Tina Hughes  Procedure(s) Performed: AN AD HOC LABOR EPIDURAL  Patient location during evaluation: PACU Anesthesia Type: Epidural Level of consciousness: awake and alert Pain management: pain level controlled Vital Signs Assessment: post-procedure vital signs reviewed and stable Respiratory status: spontaneous breathing, nonlabored ventilation and respiratory function stable Cardiovascular status: stable Postop Assessment: no headache, no backache, patient able to bend at knees and epidural receding Anesthetic complications: no   No complications documented.   Last Vitals:  Vitals:   02/22/20 0615 02/22/20 0630  BP: 116/81 116/66  Pulse: 76 60  Resp:    Temp:    SpO2:      Last Pain:  Vitals:   02/22/20 0513  TempSrc: Oral  PainSc: 0-No pain                 Precious Haws Shareece Bultman

## 2020-02-22 NOTE — Anesthesia Procedure Notes (Signed)
Procedure Name: Intubation Performed by: Orion Crook, CRNA Pre-anesthesia Checklist: Patient identified, Emergency Drugs available, Suction available, Patient being monitored and Timeout performed Patient Re-evaluated:Patient Re-evaluated prior to induction Oxygen Delivery Method: Circle system utilized Preoxygenation: Pre-oxygenation with 100% oxygen Induction Type: IV induction and Cricoid Pressure applied Laryngoscope Size: Mac and 3 Grade View: Grade I Tube type: Oral Tube size: 7.0 mm Number of attempts: 1 Placement Confirmation: ETT inserted through vocal cords under direct vision,  positive ETCO2 and breath sounds checked- equal and bilateral Tube secured with: Tape Dental Injury: Teeth and Oropharynx as per pre-operative assessment

## 2020-02-22 NOTE — Anesthesia Preprocedure Evaluation (Signed)
Anesthesia Evaluation  Patient identified by MRN, date of birth, ID band Patient awake    Reviewed: Allergy & Precautions, H&P , NPO status , Patient's Chart, lab work & pertinent test results, reviewed documented beta blocker date and time   Airway Mallampati: II  TM Distance: >3 FB Neck ROM: full    Dental no notable dental hx. (+) Teeth Intact   Pulmonary neg pulmonary ROS, Current Smoker, former smoker,    Pulmonary exam normal breath sounds clear to auscultation       Cardiovascular Exercise Tolerance: Good negative cardio ROS   Rhythm:regular Rate:Normal     Neuro/Psych Anxiety negative neurological ROS  negative psych ROS   GI/Hepatic negative GI ROS, Neg liver ROS,   Endo/Other  negative endocrine ROSdiabetes, Gestational  Renal/GU      Musculoskeletal   Abdominal   Peds  Hematology negative hematology ROS (+)   Anesthesia Other Findings   Reproductive/Obstetrics (+) Pregnancy                             Anesthesia Physical Anesthesia Plan  ASA: II  Anesthesia Plan: Epidural   Post-op Pain Management:    Induction:   PONV Risk Score and Plan:   Airway Management Planned:   Additional Equipment:   Intra-op Plan:   Post-operative Plan:   Informed Consent: I have reviewed the patients History and Physical, chart, labs and discussed the procedure including the risks, benefits and alternatives for the proposed anesthesia with the patient or authorized representative who has indicated his/her understanding and acceptance.       Plan Discussed with:   Anesthesia Plan Comments:         Anesthesia Quick Evaluation

## 2020-02-22 NOTE — Transfer of Care (Signed)
Immediate Anesthesia Transfer of Care Note  Patient: Tina Hughes  Procedure(s) Performed: POST PARTUM TUBAL LIGATION (Bilateral Abdomen)  Patient Location: PACU  Anesthesia Type:General  Level of Consciousness: awake  Airway & Oxygen Therapy: Patient Spontanous Breathing  Post-op Assessment: Report given to RN  Post vital signs: Reviewed and stable  Last Vitals:  Vitals Value Taken Time  BP 120/81 02/22/20 0918  Temp    Pulse 94 02/22/20 0919  Resp 16 02/22/20 0919  SpO2 97 % 02/22/20 0919  Vitals shown include unvalidated device data.  Last Pain:  Vitals:   02/22/20 0513  TempSrc: Oral  PainSc: 0-No pain         Complications: No complications documented.

## 2020-02-22 NOTE — Anesthesia Postprocedure Evaluation (Signed)
Anesthesia Post Note  Patient: Tina Hughes  Procedure(s) Performed: POST PARTUM TUBAL LIGATION (Bilateral Abdomen)  Patient location during evaluation: PACU Anesthesia Type: General Level of consciousness: awake and alert Pain management: pain level controlled Vital Signs Assessment: post-procedure vital signs reviewed and stable Respiratory status: spontaneous breathing, nonlabored ventilation, respiratory function stable and patient connected to nasal cannula oxygen Cardiovascular status: blood pressure returned to baseline and stable Postop Assessment: no apparent nausea or vomiting Anesthetic complications: no   No complications documented.   Last Vitals:  Vitals:   02/22/20 0947 02/22/20 1014  BP: 119/81 112/64  Pulse: 66 65  Resp: 16 18  Temp: (!) 36.4 C (!) 36.3 C  SpO2: 98% 99%    Last Pain:  Vitals:   02/22/20 0947  TempSrc:   PainSc: 4                  Precious Haws Analese Sovine

## 2020-02-22 NOTE — Anesthesia Procedure Notes (Signed)
Epidural Patient location during procedure: OB  Staffing Performed: anesthesiologist   Preanesthetic Checklist Completed: patient identified, IV checked, site marked, risks and benefits discussed, surgical consent, monitors and equipment checked, pre-op evaluation and timeout performed  Epidural Patient position: sitting Prep: Betadine Patient monitoring: heart rate, continuous pulse ox and blood pressure Approach: midline Location: L3-L4 Injection technique: LOR saline  Needle:  Needle type: Tuohy  Needle gauge: 17 G Needle length: 9 cm and 9 Needle insertion depth: 6 cm Catheter type: closed end flexible Catheter size: 20 Guage Catheter at skin depth: 12 cm Test dose: negative and 1.5% lidocaine with Epi 1:200 K  Assessment Sensory level: T10 Events: blood not aspirated, injection not painful, no injection resistance, no paresthesia and negative IV test  Additional Notes   Patient tolerated the insertion well without complications.-SATD -IVTD. No paresthesia. Refer to [place] for VS and dosingReason for block:procedure for pain

## 2020-02-22 NOTE — Op Note (Signed)
Procedure(s): POST PARTUM TUBAL LIGATION Procedure Note  Tina Hughes female 29 y.o. 02/22/2020  Indications: The patient is a 29 y.o. G44P3003 female who is PPD#0 s/p SVD with undesired fertility.   Pre-operative Diagnosis: Multiparity, undesired fertility  Post-operative Diagnosis: Same  Surgeon: Rubie Maid, MD  Assistants:  Surgical scrub tech .  Anesthesia: General endotracheal anesthesia  Procedure Details: The patient was seen in the Holding Room. The risks, benefits, complications, treatment options, and expected outcomes were discussed with the patient.  The patient concurred with the proposed plan, giving informed consent.  The site of surgery properly noted/marked. The patient was taken to the Operating Room, identified as [name] and the procedure verified as Procedure(s) (LRB): POST PARTUM TUBAL LIGATION (Bilateral). A Time Out was held and the above information confirmed.  Her epidural catheter was removed, and she was then placed under general anesthesia without difficulty. She was placed in the supine position.  After an adequate timeout was performed, attention was turned to the patient's abdomen where a small transverse skin incision was made under the umbilical fold. The incision was taken down to the layer of fascia using the scalpel, and fascia was incised, and extended bilaterally using Mayo scissors. The peritoneum was entered in a sharp fashion. Attention was then turned to the patient's uterus, and left fallopian tube was identified and followed out to the fimbriated end.  The tube was then followed out to the vimbria.  The Babcock clamp was then used to grasp the tube approximately 4 cm from the cornual region.  A 3 cm segment of tube was then ligated with a free tie of 0-Chromic using the [place] method and excised.  The right fallopian tube was then ligated in a similar fashion and excised. The tubal lumens were cauterized bilaterally.  Good hemostasis  was noted with bilateral fallopian tubes. The uterus was then returned to the abdomen.   The instruments were then removed from the patient's abdomen and the fascial incision was repaired with 0-Vicryl, and injected with 10 cc of 0.5% Sensorcaine. The skin was closed with a 4-0 Vicryl subcuticular stitch, and also injected with 10 cc of 0.5% Sensorcaine. The patient tolerated the procedure well.  Instrument, sponge, and needle counts were correct times two  The patient was then taken to the recovery room awake and in stable condition.   Estimated Blood Loss:  minimal (8 cc)     Drains: straight catheterization in place. 50 ml of clear urine by end of the procedure.          Total IV Fluids:  500 ml  Specimens: Segments of bilateral fallopian tubes         Implants: None         Complications:  None; patient tolerated the procedure well.         Disposition: PACU - hemodynamically stable.         Condition: stable   Rubie Maid, MD [facility]

## 2020-02-23 ENCOUNTER — Encounter: Payer: Self-pay | Admitting: Obstetrics and Gynecology

## 2020-02-23 LAB — CBC
HCT: 31.9 % — ABNORMAL LOW (ref 36.0–46.0)
Hemoglobin: 10.8 g/dL — ABNORMAL LOW (ref 12.0–15.0)
MCH: 29.2 pg (ref 26.0–34.0)
MCHC: 33.9 g/dL (ref 30.0–36.0)
MCV: 86.2 fL (ref 80.0–100.0)
Platelets: 153 10*3/uL (ref 150–400)
RBC: 3.7 MIL/uL — ABNORMAL LOW (ref 3.87–5.11)
RDW: 13.7 % (ref 11.5–15.5)
WBC: 9.6 10*3/uL (ref 4.0–10.5)
nRBC: 0 % (ref 0.0–0.2)

## 2020-02-23 LAB — FETAL SCREEN: Fetal Screen: NEGATIVE

## 2020-02-23 MED ORDER — IBUPROFEN 600 MG PO TABS: 600.0000 mg | ORAL_TABLET | Freq: Four times a day (QID) | ORAL | 0 refills | Status: DC

## 2020-02-23 MED ORDER — HYDROCODONE-ACETAMINOPHEN 5-325 MG PO TABS: 1.0000 | ORAL_TABLET | Freq: Four times a day (QID) | ORAL | 0 refills | Status: DC | PRN

## 2020-02-23 MED ORDER — RHO D IMMUNE GLOBULIN 1500 UNIT/2ML IJ SOSY
300.0000 ug | PREFILLED_SYRINGE | Freq: Once | INTRAMUSCULAR | Status: AC
  Administered 2020-02-23: 300 ug via INTRAVENOUS
  Filled 2020-02-23: qty 2

## 2020-02-23 MED ORDER — SIMETHICONE 80 MG PO CHEW: 80.0000 mg | CHEWABLE_TABLET | ORAL | 0 refills | Status: DC | PRN

## 2020-02-23 NOTE — Discharge Summary (Signed)
Obstetric Discharge Summary  Patient ID: Tina Hughes MRN: [id number] DOB/AGE: 1991-04-28 29 y.o.   Date of Admission: 02/21/2020  Date of Discharge:  02/23/20  Admitting Diagnosis: Onset of Labor at [redacted]w[redacted]d  Secondary Diagnosis: RH negative status, History of anxiety, History gestational hypertension  Mode of Delivery: Normal spontaneous vaginal delivery     Discharge Diagnosis: No other diagnosis   Intrapartum Procedures: Epidural   Post partum procedures: Postpartum bilateral tubal ligation, Rhogam  Complications: Right labial laceration, repaired   Brief Hospital Course   Tina Hughes is a [id number] who had a SVD on 02/22/2020;  for further details, please refer to the delivey summary. Patient had an uncomplicated postpartum course including a bilateral tubal ligation as contraception.  By time of discharge on PPD#1, her pain was controlled on oral pain medications; she had appropriate lochia and was ambulating, voiding without difficulty and tolerating regular diet.  She was deemed stable for discharge to home.    Labs: CBC Latest Ref Rng & Units 02/23/2020 02/22/2020 12/03/2019  WBC 4.0 - 10.5 K/uL 9.6 10.5 6.5  Hemoglobin 12.0 - 15.0 g/dL 10.8(L) 12.4 11.5  Hematocrit 36 - 46 % 31.9(L) 36.3 34.3  Platelets 150 - 400 K/uL 153 209 194   O NEG  Physical exam:  Temp:  [97.4 F (36.3 C)-99.2 F (37.3 C)] 98.5 F (36.9 C) (07/04 0751) Pulse Rate:  [59-70] 59 (07/04 0751) Resp:  [18-20] 18 (07/04 0751) BP: (90-118)/(52-82) 112/82 (07/04 0751) SpO2:  [97 %-100 %] 100 % (07/04 0751)  General: alert and no distress  Lochia: appropriate  Abdomen: soft, NT  Uterine Fundus: firm  Incision: covered by dressing  Extremities: No evidence of DVT seen on physical exam. No lower extremity edema.  Edinburgh Postnatal Depression Scale Screening Tool 02/22/2020  I have been able to laugh and see the funny side of things. 0  I have looked forward with enjoyment to things. 0   I have blamed myself unnecessarily when things went wrong. 1  I have been anxious or worried for no good reason. 2  I have felt scared or panicky for no good reason. 1  Things have been getting on top of me. 1  I have been so unhappy that I have had difficulty sleeping. 0  I have felt sad or miserable. 0  I have been so unhappy that I have been crying. 0  The thought of harming myself has occurred to me. 0  Edinburgh Postnatal Depression Scale Total 5     Discharge Instructions: Per After Visit Summary.  Activity: Advance as tolerated. Pelvic rest for 6 weeks.  Also refer to After Visit Summary  Diet: Regular  Medications: Allergies as of 02/23/2020   No Known Allergies     Medication List    TAKE these medications   HYDROcodone-acetaminophen 5-325 MG tablet Commonly known as: NORCO/VICODIN Take 1-2 tablets by mouth every 6 (six) hours as needed for moderate pain or severe pain.   ibuprofen 600 MG tablet Commonly known as: ADVIL Take 1 tablet (600 mg total) by mouth every 6 (six) hours.   prenatal vitamin w/FE, FA 27-1 MG Tabs tablet Take 1 tablet by mouth daily at 12 noon.   simethicone 80 MG chewable tablet Commonly known as: MYLICON Chew 1 tablet (80 mg total) by mouth as needed for flatulence.      Outpatient follow up:   Follow-up Information    Diona Fanti, CNM Follow up.   Specialties: Certified Nurse  Midwife, Obstetrics and Gynecology, Radiology Why: The office will call you to schedule a one week televisit and four week postpartum visit. Contact information: [address] [place] [address] [phone]              Postpartum contraception: bilateral tubal ligation  Discharged Condition: stable  Discharged to: home   Newborn Data:  Disposition:home with mother  Apgars: APGAR (1 MIN): 8   APGAR (5 MINS): 9     Baby Feeding: Breast    Dani Gobble, CNM  [facility], [facility] 02/23/20 9:53  AM

## 2020-02-23 NOTE — Discharge Instructions (Signed)
Rh0 [D] Immune Globulin injection What is this medicine? RhO [D] IMMUNE GLOBULIN (i MYOON GLOB yoo lin) is used to treat idiopathic thrombocytopenic purpura (ITP). This medicine is used in RhO negative mothers who are pregnant with a RhO positive child. It is also used after a transfusion of RhO positive blood into a RhO negative person. This medicine may be used for other purposes; ask your health care provider or pharmacist if you have questions. COMMON BRAND NAME(S): BayRho-D, HyperRHO S/D, MICRhoGAM, RhoGAM, Rhophylac, WinRho SDF What should I tell my health care provider before I take this medicine? They need to know if you have any of these conditions:  bleeding disorders  low levels of immunoglobulin A in the body  no spleen  an unusual or allergic reaction to human immune globulin, other medicines, foods, dyes, or preservatives  pregnant or trying to get pregnant  breast-feeding How should I use this medicine? This medicine is for injection into a muscle or into a vein. It is given by a health care professional in a hospital or clinic setting. Talk to your pediatrician regarding the use of this medicine in children. This medicine is not approved for use in children. Overdosage: If you think you have taken too much of this medicine contact a poison control center or emergency room at once. NOTE: This medicine is only for you. Do not share this medicine with others. What if I miss a dose? It is important not to miss your dose. Call your doctor or health care professional if you are unable to keep an appointment. What may interact with this medicine?  live virus vaccines, like measles, mumps, or rubella This list may not describe all possible interactions. Give your health care provider a list of all the medicines, herbs, non-prescription drugs, or dietary supplements you use. Also tell them if you smoke, drink alcohol, or use illegal drugs. Some items may interact with your  medicine. What should I watch for while using this medicine? This medicine is made from human blood. It may be possible to pass an infection in this medicine. Talk to your doctor about the risks and benefits of this medicine. This medicine may interfere with live virus vaccines. Before you get live virus vaccines tell your health care professional if you have received this medicine within the past 3 months. What side effects may I notice from receiving this medicine? Side effects that you should report to your doctor or health care professional as soon as possible:  allergic reactions like skin rash, itching or hives, swelling of the face, lips, or tongue  breathing problems  chest pain or tightness  yellowing of the eyes or skin Side effects that usually do not require medical attention (report to your doctor or health care professional if they continue or are bothersome):  fever  pain and tenderness at site where injected This list may not describe all possible side effects. Call your doctor for medical advice about side effects. You may report side effects to FDA at 1-800-FDA-1088. Where should I keep my medicine? This drug is given in a hospital or clinic and will not be stored at home. NOTE: This sheet is a summary. It may not cover all possible information. If you have questions about this medicine, talk to your doctor, pharmacist, or health care provider.  2020 Elsevier/Gold Standard (2008-04-07 14:06:10)   Postpartum Tubal Ligation, Care After This sheet gives you information about how to care for yourself after your procedure. Your health care  provider may also give you more specific instructions. If you have problems or questions, contact your health care provider. What can I expect after the procedure? After the procedure, you may have:  A sore throat.  Bruising or pain in your back.  Nausea or vomiting.  Dizziness.  Mild abdominal discomfort or pain, such as  cramping, gas pain, or feeling bloated.  Soreness around the incision area.  Tiredness.  Pain in your shoulders. Follow these instructions at home: Medicines  Ask your health care provider if the medicine prescribed to you: ? Requires you to avoid driving or using heavy machinery. ? Can cause constipation. You may need to take actions to prevent or treat constipation, such as:  Drink enough fluid to keep your urine pale yellow.  Take over-the-counter or prescription medicines.  Eat foods that are high in fiber, such as beans, whole grains, and fresh fruits and vegetables.  Limit foods that are high in fat and processed sugars, such as fried or sweet foods.  Do not take aspirin because it can cause bleeding. Activity  Rest for the remainder of the day.  Return to your normal activities as told by your health care provider. Ask your health care provider what activities are safe for you.  Do not have sex, douche, or put a tampon or anything else in your vagina for 6 weeks or as long as told by your health care provider.  Do not lift anything that is heavier than your baby for 2 weeks, or the limit that you are told, until your health care provider says that it is safe. Incision care      Follow instructions from your health care provider about how to take care of your incision. Make sure you: ? Wash your hands with soap and water before and after you change your bandage (dressing). If soap and water are not available, use hand sanitizer. ? Change your dressing as told by your health care provider. ? Leave stitches (sutures), skin glue, or adhesive strips in place. These skin closures may need to stay in place for 2 weeks or longer. If adhesive strip edges start to loosen and curl up, you may trim the loose edges. Do not remove adhesive strips completely unless your health care provider tells you to do that.  Check your incision area every day for signs of infection. Check  for: ? Redness, swelling, or pain. ? Fluid or blood. ? Warmth. ? Pus or a bad smell. Other Instructions  Do not take baths, swim, or use a hot tub until your health care provider approves. Ask your health care provider if you may take showers. You may only be allowed to take sponge baths.  Keep all follow-up visits as told by your health care provider. This is important. Contact a health care provider if:  You have redness, swelling, or pain around your incision.  Your incision feels warm to the touch.  Your pain does not improve after 2-3 days.  You have a rash.  You repeatedly become dizzy or lightheaded.  Your pain medicine is not helping.  You are constipated. Get help right away if you:  Have a fever.  Faint.  Have pain in your abdomen that gets worse.  Have fluid or blood coming from your incision.  You have pus or a bad smell coming from your incision.  The edges of your incision break open after the sutures have been removed.  Have shortness of breath or trouble breathing.  Have chest pain or leg pain.  Have ongoing nausea or diarrhea. Summary  Mild abdominal discomfort is common after this procedure.  Contact your health care provider if you experience problems or have concerns.  Do not lift anything that is heavier than your baby for 2 weeks, or the limit that you are told, until your health care provider says that it is safe.  Keep all follow-up visits as told by your health care provider. This is important. This information is not intended to replace advice given to you by your health care provider. Make sure you discuss any questions you have with your health care provider. Document Revised: 01/21/2019 Document Reviewed: 06/28/2018 Elsevier Patient Education  [place].   Breastfeeding Tips for a Good Latch Breastfeeding can be challenging, especially during the first few weeks after childbirth. It is normal to have some problems when  you start to breastfeed your new baby, even if you have breastfed before. Latching is an important part of having a good breastfeeding experience. This refers to how your baby's mouth attaches to your nipple to breastfeed. Your baby may have trouble latching due to:  Poor positioning.  Nipple confusion. This can occur if you introduce a bottle or pacifier too early.  Abnormalities in your baby's mouth, tongue, or lips. This includes conditions like tongue-tie or cleft lip.  The shape of your nipples, such as nipples that are flat or turned in (inverted).  Your baby being born early (prematurely). Small babies often have a weak suck. Work with a breastfeeding specialist (Science writer) to find positions and strategies that will help make sure your baby has a good latch. How does this affect me? A poor latch may cause you to have problems such as:  Cracked or sore nipples.  Breasts becoming overfilled with milk (engorgement).  Plugged milk ducts.  Low milk supply.  Breast inflammation or infection. How does this affect my baby? A poor latch may cause your baby to not be able to feed effectively. As a result, he or she may have trouble gaining weight. Follow these instructions at home: How to position your baby  Find a comfortable place to sit or lie down, with your neck and back well supported.  If you are seated, place a pillow or rolled-up blanket under your baby to bring him or her to the level of your breast.  Make sure that your baby's abdomen is facing your abdomen.  Try different positions to find one that works best for you and your baby. How to help your baby latch  To begin each breastfeeding session, gently massage your breast. With your fingertips, massage from your chest wall toward your nipple in a circular motion. This encourages milk flow. If your milk flows slowly, you may need to continue this action during feeding. Position your breast for an ideal  latch. Support your breast with four fingers underneath and your thumb above your nipple. Keep your fingers away from your nipple and your baby's mouth. To help your baby latch, follow these steps: 1. Stroke your baby's lips gently with your finger or nipple. 2. When your baby's mouth is open wide enough, quickly bring your baby to your breast and place your entire nipple, and as much of the areolaas possible, into your baby's mouth. The areola is the colored area around your nipple. 3. Your baby's tongue should be between his or her lower gum and your breast. 4. More areola should be visible above your baby's  upper lip than below the lower lip. 5. When your baby starts sucking, you will feel a gentle pull on your nipple, but you should not feel pain. Be patient. It is common for a baby to suck for about 2-3 minutes to start the flow of breast milk. 6. Make sure that your baby's mouth is correctly positioned around your nipple. Your baby's lips should create a seal on your breast and be turned outward (everted).  General instructions  Look for the following signs that your baby has successfully latched on to your nipple: ? The baby is quietly tugging or quietly sucking without causing you pain. ? You hear the baby swallow after every 3 or 4 sucks. ? You see muscle movement above and in front of the baby's ears while he or she is sucking.  Be aware of these signs that your baby has not successfully latched on to your nipple: ? The baby makes sucking sounds or smacking sounds while nursing. ? You have nipple pain.  If your baby is not latched well, insert your little finger between your baby's gums and your nipple to break the seal. Then try to help your baby latch again. Contact a health care provider if:  You have cracking or soreness in your nipples that lasts longer than 1 week.  You have nipple pain. Nipple cracking and soreness are common during the first week after birth, but nipple  pain is never normal.  You have breast engorgement that does not improve after 48-72 hours.  You have a plugged milk duct and a fever.  You follow suggestions for a good latch but you continue to have problems or concerns.  You have pus-like discharge coming from your breast.  Your baby is not gaining weight or loses weight. Summary  Many common breastfeeding challenges are caused by poor latching. Latching refers to how your baby's mouth attaches to your nipple during breastfeeding.  If problems continue, seek help from a lactation consultant in your community. He or she can assess you and your baby for any latching problems. The lactation consultant can work with you to develop a plan to overcome any breastfeeding challenges. This information is not intended to replace advice given to you by your health care provider. Make sure you discuss any questions you have with your health care provider. Document Revised: 11/28/2018 Document Reviewed: 03/22/2017 Elsevier Patient Education  2020 [place].   Postpartum Care After Vaginal Delivery This sheet gives you information about how to care for yourself from the time you deliver your baby to up to 6-12 weeks after delivery (postpartum period). Your health care provider may also give you more specific instructions. If you have problems or questions, contact your health care provider. Follow these instructions at home: Vaginal bleeding  It is normal to have vaginal bleeding (lochia) after delivery. Wear a sanitary pad for vaginal bleeding and discharge. ? During the first week after delivery, the amount and appearance of lochia is often similar to a menstrual period. ? Over the next few weeks, it will gradually decrease to a dry, yellow-brown discharge. ? For most women, lochia stops completely by 4-6 weeks after delivery. Vaginal bleeding can vary from woman to woman.  Change your sanitary pads frequently. Watch for any changes in your  flow, such as: ? A sudden increase in volume. ? A change in color. ? Large blood clots.  If you pass a blood clot from your vagina, save it and call your health care  provider to discuss. Do not flush blood clots down the toilet before talking with your health care provider.  Do not use tampons or douches until your health care provider says this is safe.  If you are not breastfeeding, your period should return 6-8 weeks after delivery. If you are feeding your child breast milk only (exclusive breastfeeding), your period may not return until you stop breastfeeding. Perineal care  Keep the area between the vagina and the anus (perineum) clean and dry as told by your health care provider. Use medicated pads and pain-relieving sprays and creams as directed.  If you had a cut in the perineum (episiotomy) or a tear in the vagina, check the area for signs of infection until you are healed. Check for: ? More redness, swelling, or pain. ? Fluid or blood coming from the cut or tear. ? Warmth. ? Pus or a bad smell.  You may be given a squirt bottle to use instead of wiping to clean the perineum area after you go to the bathroom. As you start healing, you may use the squirt bottle before wiping yourself. Make sure to wipe gently.  To relieve pain caused by an episiotomy, a tear in the vagina, or swollen veins in the anus (hemorrhoids), try taking a warm sitz bath 2-3 times a day. A sitz bath is a warm water bath that is taken while you are sitting down. The water should only come up to your hips and should cover your buttocks. Breast care  Within the first few days after delivery, your breasts may feel heavy, full, and uncomfortable (breast engorgement). Milk may also leak from your breasts. Your health care provider can suggest ways to help relieve the discomfort. Breast engorgement should go away within a few days.  If you are breastfeeding: ? Wear a bra that supports your breasts and fits you  well. ? Keep your nipples clean and dry. Apply creams and ointments as told by your health care provider. ? You may need to use breast pads to absorb milk that leaks from your breasts. ? You may have uterine contractions every time you breastfeed for up to several weeks after delivery. Uterine contractions help your uterus return to its normal size. ? If you have any problems with breastfeeding, work with your health care provider or Science writer.  If you are not breastfeeding: ? Avoid touching your breasts a lot. Doing this can make your breasts produce more milk. ? Wear a good-fitting bra and use cold packs to help with swelling. ? Do not squeeze out (express) milk. This causes you to make more milk. Intimacy and sexuality  Ask your health care provider when you can engage in sexual activity. This may depend on: ? Your risk of infection. ? How fast you are healing. ? Your comfort and desire to engage in sexual activity.  You are able to get pregnant after delivery, even if you have not had your period. If desired, talk with your health care provider about methods of birth control (contraception). Medicines  Take over-the-counter and prescription medicines only as told by your health care provider.  If you were prescribed an antibiotic medicine, take it as told by your health care provider. Do not stop taking the antibiotic even if you start to feel better. Activity  Gradually return to your normal activities as told by your health care provider. Ask your health care provider what activities are safe for you.  Rest as much as possible. Try to  rest or take a nap while your baby is sleeping. Eating and drinking   Drink enough fluid to keep your urine pale yellow.  Eat high-fiber foods every day. These may help prevent or relieve constipation. High-fiber foods include: ? Whole grain cereals and breads. ? Brown rice. ? Beans. ? Fresh fruits and vegetables.  Do not try to  lose weight quickly by cutting back on calories.  Take your prenatal vitamins until your postpartum checkup or until your health care provider tells you it is okay to stop. Lifestyle  Do not use any products that contain nicotine or tobacco, such as cigarettes and e-cigarettes. If you need help quitting, ask your health care provider.  Do not drink alcohol, especially if you are breastfeeding. General instructions  Keep all follow-up visits for you and your baby as told by your health care provider. Most women visit their health care provider for a postpartum checkup within the first 3-6 weeks after delivery. Contact a health care provider if:  You feel unable to cope with the changes that your child brings to your life, and these feelings do not go away.  You feel unusually sad or worried.  Your breasts become red, painful, or hard.  You have a fever.  You have trouble holding urine or keeping urine from leaking.  You have little or no interest in activities you used to enjoy.  You have not breastfed at all and you have not had a menstrual period for 12 weeks after delivery.  You have stopped breastfeeding and you have not had a menstrual period for 12 weeks after you stopped breastfeeding.  You have questions about caring for yourself or your baby.  You pass a blood clot from your vagina. Get help right away if:  You have chest pain.  You have difficulty breathing.  You have sudden, severe leg pain.  You have severe pain or cramping in your lower abdomen.  You bleed from your vagina so much that you fill more than one sanitary pad in one hour. Bleeding should not be heavier than your heaviest period.  You develop a severe headache.  You faint.  You have blurred vision or spots in your vision.  You have bad-smelling vaginal discharge.  You have thoughts about hurting yourself or your baby. If you ever feel like you may hurt yourself or others, or have thoughts  about taking your own life, get help right away. You can go to the nearest emergency department or call:  Your local emergency services (911 in the [place]  A suicide crisis helpline, such as the [facility] at 2796131471. This is open 24 hours a day. Summary  The period of time right after you deliver your newborn up to 6-12 weeks after delivery is called the postpartum period.  Gradually return to your normal activities as told by your health care provider.  Keep all follow-up visits for you and your baby as told by your health care provider. This information is not intended to replace advice given to you by your health care provider. Make sure you discuss any questions you have with your health care provider. Document Revised: 08/11/2017 Document Reviewed: 05/22/2017 Elsevier Patient Education  2020 [employer].   Postpartum Baby Blues The postpartum period begins right after the birth of a baby. During this time, there is often a lot of joy and excitement. It is also a time of many changes in the life of the parents. No matter how  many times a mother gives birth, each child brings new challenges to the family, including different ways of relating to one another. It is common to have feelings of excitement along with confusing changes in moods, emotions, and thoughts. You may feel happy one minute and sad or stressed the next. These feelings of sadness usually happen in the period right after you have your baby, and they go away within a week or two. This is called the "baby blues." What are the causes? There is no known cause of baby blues. It is likely caused by a combination of factors. However, changes in hormone levels after childbirth are believed to trigger some of the symptoms. Other factors that can play a role in these mood changes include:  Lack of sleep.  Stressful life events, such as poverty, caring for a loved one, or death of a loved  one.  Genetics. What are the signs or symptoms? Symptoms of this condition include:  Brief changes in mood, such as going from extreme happiness to sadness.  Decreased concentration.  Difficulty sleeping.  Crying spells and tearfulness.  Loss of appetite.  Irritability.  Anxiety. If the symptoms of baby blues last for more than 2 weeks or become more severe, you may have postpartum depression. How is this diagnosed? This condition is diagnosed based on an evaluation of your symptoms. There are no medical or lab tests that lead to a diagnosis, but there are various questionnaires that a health care provider may use to identify women with the baby blues or postpartum depression. How is this treated? Treatment is not needed for this condition. The baby blues usually go away on their own in 1-2 weeks. Social support is often all that is needed. You will be encouraged to get adequate sleep and rest. Follow these instructions at home: Lifestyle      Get as much rest as you can. Take a nap when the baby sleeps.  Exercise regularly as told by your health care provider. Some women find yoga and walking to be helpful.  Eat a balanced and nourishing diet. This includes plenty of fruits and vegetables, whole grains, and lean proteins.  Do little things that you enjoy. Have a cup of tea, take a bubble bath, read your favorite magazine, or listen to your favorite music.  Avoid alcohol.  Ask for help with household chores, cooking, grocery shopping, or running errands. Do not try to do everything yourself. Consider hiring a postpartum doula to help. This is a professional who specializes in providing support to new mothers.  Try not to make any major life changes during pregnancy or right after giving birth. This can add stress. General instructions  Talk to people close to you about how you are feeling. Get support from your partner, family members, friends, or other new moms. You may  want to join a support group.  Find ways to cope with stress. This may include: ? Writing your thoughts and feelings in a journal. ? Spending time outside. ? Spending time with people who make you laugh.  Try to stay positive in how you think. Think about the things you are grateful for.  Take over-the-counter and prescription medicines only as told by your health care provider.  Let your health care provider know if you have any concerns.  Keep all postpartum visits as told by your health care provider. This is important. Contact a health care provider if:  Your baby blues do not go away after 2  weeks. Get help right away if:  You have thoughts of taking your own life (suicidal thoughts).  You think you may harm the baby or other people.  You see or hear things that are not there (hallucinations). Summary  After giving birth, you may feel happy one minute and sad or stressed the next. Feelings of sadness that happen right after the baby is born and go away after a week or two are called the "baby blues."  You can manage the baby blues by getting enough rest, eating a healthy diet, exercising, spending time with supportive people, and finding ways to cope with stress.  If feelings of sadness and stress last longer than 2 weeks or get in the way of caring for your baby, talk to your health care provider. This may mean you have postpartum depression. This information is not intended to replace advice given to you by your health care provider. Make sure you discuss any questions you have with your health care provider. Document Revised: 11/30/2018 Document Reviewed: 10/04/2016 Elsevier Patient Education  [employer].

## 2020-02-23 NOTE — Progress Notes (Signed)
Pt discharged with infant.  Discharge instructions, prescriptions and follow up appointment given to and reviewed with pt. Pt verbalized understanding. Escorted out by staff. 

## 2020-02-24 LAB — RHOGAM INJECTION: Unit division: 0

## 2020-02-25 ENCOUNTER — Other Ambulatory Visit: Payer: Self-pay | Admitting: Certified Nurse Midwife

## 2020-02-25 ENCOUNTER — Other Ambulatory Visit: Payer: Self-pay

## 2020-02-25 ENCOUNTER — Encounter: Payer: Self-pay | Admitting: Certified Nurse Midwife

## 2020-02-25 ENCOUNTER — Ambulatory Visit (INDEPENDENT_AMBULATORY_CARE_PROVIDER_SITE_OTHER): Payer: Medicaid Other | Admitting: Certified Nurse Midwife

## 2020-02-25 ENCOUNTER — Other Ambulatory Visit: Payer: Medicaid Other

## 2020-02-25 VITALS — BP 160/84 | HR 79 | Ht 66.0 in | Wt 156.6 lb

## 2020-02-25 DIAGNOSIS — N6459 Other signs and symptoms in breast: Secondary | ICD-10-CM

## 2020-02-25 DIAGNOSIS — Z09 Encounter for follow-up examination after completed treatment for conditions other than malignant neoplasm: Secondary | ICD-10-CM

## 2020-02-25 DIAGNOSIS — R609 Edema, unspecified: Secondary | ICD-10-CM

## 2020-02-25 DIAGNOSIS — Z3A39 39 weeks gestation of pregnancy: Secondary | ICD-10-CM

## 2020-02-25 MED ORDER — SERTRALINE HCL 25 MG PO TABS: 25.0000 mg | ORAL_TABLET | Freq: Every day | ORAL | 1 refills | Status: DC

## 2020-02-25 NOTE — Patient Instructions (Signed)
Postpartum Hypertension Postpartum hypertension is high blood pressure that remains higher than normal after childbirth. You may not realize that you have postpartum hypertension if your blood pressure is not being checked regularly. In most cases, postpartum hypertension will go away on its own, usually within a week of delivery. However, for some women, medical treatment is required to prevent serious complications, such as seizures or stroke. What are the causes? This condition may be caused by one or more of the following:  Hypertension that existed before pregnancy (chronic hypertension).  Hypertension that comes on as a result of pregnancy (gestational hypertension).  Hypertensive disorders during pregnancy (preeclampsia) or seizures in women who have high blood pressure during pregnancy (eclampsia).  A condition in which the liver, platelets, and red blood cells are damaged during pregnancy (HELLP syndrome).  A condition in which the thyroid produces too much hormones (hyperthyroidism).  Other rare problems of the nerves (neurological disorders) or blood disorders. In some cases, the cause may not be known. What increases the risk? The following factors may make you more likely to develop this condition:  Chronic hypertension. In some cases, this may not have been diagnosed before pregnancy.  Obesity.  Type 2 diabetes.  Kidney disease.  History of preeclampsia or eclampsia.  Other medical conditions that change the level of hormones in the body (hormonal imbalance). What are the signs or symptoms? As with all types of hypertension, postpartum hypertension may not have any symptoms. Depending on how high your blood pressure is, you may experience:  Headaches. These may be mild, moderate, or severe. They may also be steady, constant, or sudden in onset (thunderclap headache).  Changes in your ability to see (visual changes).  Dizziness.  Shortness of breath.  Swelling  of your hands, feet, lower legs, or face. In some cases, you may have swelling in more than one of these locations.  Heart palpitations or a racing heartbeat.  Difficulty breathing while lying down.  Decrease in the amount of urine that you pass. Other rare signs and symptoms may include:  Sweating more than usual. This lasts longer than a few days after delivery.  Chest pain.  Sudden dizziness when you get up from sitting or lying down.  Seizures.  Nausea or vomiting.  Abdominal pain. How is this diagnosed? This condition may be diagnosed based on the results of a physical exam, blood pressure measurements, and blood and urine tests. You may also have other tests, such as a CT scan or an MRI, to check for other problems of postpartum hypertension. How is this treated? If blood pressure is high enough to require treatment, your options may include:  Medicines to reduce blood pressure (antihypertensives). Tell your health care provider if you are breastfeeding or if you plan to breastfeed. There are many antihypertensive medicines that are safe to take while breastfeeding.  Stopping medicines that may be causing hypertension.  Treating medical conditions that are causing hypertension.  Treating the complications of hypertension, such as seizures, stroke, or kidney problems. Your health care provider will also continue to monitor your blood pressure closely until it is within a safe range for you. Follow these instructions at home:  Take over-the-counter and prescription medicines only as told by your health care provider.  Return to your normal activities as told by your health care provider. Ask your health care provider what activities are safe for you.  Do not use any products that contain nicotine or tobacco, such as cigarettes and e-cigarettes. If   you need help quitting, ask your health care provider.  Keep all follow-up visits as told by your health care provider. This  is important. Contact a health care provider if:  Your symptoms get worse.  You have new symptoms, such as: ? A headache that does not get better. ? Dizziness. ? Visual changes. Get help right away if:  You suddenly develop swelling in your hands, ankles, or face.  You have sudden, rapid weight gain.  You develop difficulty breathing, chest pain, racing heartbeat, or heart palpitations.  You develop severe pain in your abdomen.  You have any symptoms of a stroke. "BE FAST" is an easy way to remember the main warning signs of a stroke: ? B - Balance. Signs are dizziness, sudden trouble walking, or loss of balance. ? E - Eyes. Signs are trouble seeing or a sudden change in vision. ? F - Face. Signs are sudden weakness or numbness of the face, or the face or eyelid drooping on one side. ? A - Arms. Signs are weakness or numbness in an arm. This happens suddenly and usually on one side of the body. ? S - Speech. Signs are sudden trouble speaking, slurred speech, or trouble understanding what people say. ? T - Time. Time to call emergency services. Write down what time symptoms started.  You have other signs of a stroke, such as: ? A sudden, severe headache with no known cause. ? Nausea or vomiting. ? Seizure. These symptoms may represent a serious problem that is an emergency. Do not wait to see if the symptoms will go away. Get medical help right away. Call your local emergency services (911 in the [place] Do not drive yourself to the hospital. Summary  Postpartum hypertension is high blood pressure that remains higher than normal after childbirth.  In most cases, postpartum hypertension will go away on its own, usually within a week of delivery.  For some women, medical treatment is required to prevent serious complications, such as seizures or stroke. This information is not intended to replace advice given to you by your health care provider. Make sure you discuss any questions  you have with your health care provider. Document Revised: 09/14/2018 Document Reviewed: 05/29/2017 Elsevier Patient Education  2020 Elsevier Inc.  

## 2020-02-25 NOTE — Progress Notes (Signed)
Subjective:    Tina Hughes is a 29 y.o. G45P3003 Caucasian female who presents for a postpartum visit. She is 3 days postpartum following a spontaneous vaginal delivery and PPTL at term . She presents today with concern of swelling. She has 2+ bilaterally swelling, reflexes normal , no clonus. Occasional mild headaches that go away on there own. She is nursing and she is having cracking and bleeding of her nipples. . Bowel function is normal. Bladder function is normal. . Postpartum depression screening: positive. 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 She is not opposed to being on medications. She is sleeping 2 hrs at a time and is eating normal.   The following portions of the patient's history were reviewed and updated as appropriate: allergies, current medications, past medical history, past surgical history and problem list.  Review of Systems Pertinent items are noted in HPI.   Vitals:   02/25/20 1136  BP: (!) 160/84  Pulse: 79  Weight: 156 lb 9 oz (71 kg)  Height: 5\' 6"  (1.676 m)   Patient's last menstrual period was 05/22/2019 (exact date).  Objective:   General:  alert, cooperative and no distress   Breasts:  deferred, no complaints  Lungs: clear to auscultation bilaterally  Heart:  regular rate and rhythm  Abdomen: soft, nontender   Vulva: normal  Vagina: normal vagina  Cervix:  closed  Corpus: Well-involuted  Adnexa:  Non-palpable  Rectal Exam: No hemorrhoids         Office Visit from 02/25/2020 in [facility]  PHQ-9 Total Score 10       Assessment:   Postpartum depression Bleeding nipples Fatigue   Plan:  Encouraged increase fluid intake, lactation consult ordered, samples of lanolin given. Reassurance given.[place] labs collected today.  Zoloft 25 mg PO daily. Red flag symptoms reviewed with pt. She agrees to go to ED if she has thoughts of harming herself or her family. Follow up as scheduled with  Sharyn Lull 2 wk tele visit.   Philip Aspen, CNM

## 2020-02-26 LAB — CBC WITH DIFFERENTIAL/PLATELET
Basophils Absolute: 0 10*3/uL (ref 0.0–0.2)
Basos: 1 %
EOS (ABSOLUTE): 0.1 10*3/uL (ref 0.0–0.4)
Eos: 1 %
Hematocrit: 34.2 % (ref 34.0–46.6)
Hemoglobin: 11.6 g/dL (ref 11.1–15.9)
Immature Grans (Abs): 0 10*3/uL (ref 0.0–0.1)
Immature Granulocytes: 0 %
Lymphocytes Absolute: 1.9 10*3/uL (ref 0.7–3.1)
Lymphs: 26 %
MCH: 28.4 pg (ref 26.6–33.0)
MCHC: 33.9 g/dL (ref 31.5–35.7)
MCV: 84 fL (ref 79–97)
Monocytes Absolute: 0.3 10*3/uL (ref 0.1–0.9)
Monocytes: 4 %
Neutrophils Absolute: 4.9 10*3/uL (ref 1.4–7.0)
Neutrophils: 68 %
Platelets: 242 10*3/uL (ref 150–450)
RBC: 4.08 x10E6/uL (ref 3.77–5.28)
RDW: 13.1 % (ref 11.7–15.4)
WBC: 7.2 10*3/uL (ref 3.4–10.8)

## 2020-02-26 LAB — PROTEIN / CREATININE RATIO, URINE
Creatinine, Urine: 28.9 mg/dL
Protein, Ur: 14.3 mg/dL
Protein/Creat Ratio: 495 mg/g creat — ABNORMAL HIGH (ref 0–200)

## 2020-02-26 LAB — COMPREHENSIVE METABOLIC PANEL
ALT: 25 IU/L (ref 0–32)
AST: 33 IU/L (ref 0–40)
Albumin/Globulin Ratio: 1.4 (ref 1.2–2.2)
Albumin: 3.5 g/dL — ABNORMAL LOW (ref 3.9–5.0)
Alkaline Phosphatase: 144 IU/L — ABNORMAL HIGH (ref 48–121)
BUN/Creatinine Ratio: 14 (ref 9–23)
BUN: 11 mg/dL (ref 6–20)
Bilirubin Total: 0.3 mg/dL (ref 0.0–1.2)
CO2: 23 mmol/L (ref 20–29)
Calcium: 8.6 mg/dL — ABNORMAL LOW (ref 8.7–10.2)
Chloride: 105 mmol/L (ref 96–106)
Creatinine, Ser: 0.76 mg/dL (ref 0.57–1.00)
GFR calc Af Amer: 122 mL/min/{1.73_m2} (ref >59)
GFR calc non Af Amer: 106 mL/min/{1.73_m2} (ref >59)
Globulin, Total: 2.5 g/dL (ref 1.5–4.5)
Glucose: 67 mg/dL (ref 65–99)
Potassium: 4.4 mmol/L (ref 3.5–5.2)
Sodium: 139 mmol/L (ref 134–144)
Total Protein: 6 g/dL (ref 6.0–8.5)

## 2020-02-26 LAB — SURGICAL PATHOLOGY

## 2020-03-06 ENCOUNTER — Other Ambulatory Visit: Payer: Self-pay

## 2020-03-06 ENCOUNTER — Ambulatory Visit (INDEPENDENT_AMBULATORY_CARE_PROVIDER_SITE_OTHER): Payer: Medicaid Other | Admitting: Certified Nurse Midwife

## 2020-03-06 NOTE — Progress Notes (Signed)
A call was placed to patient for a televisit. DOB as identifier. States she is beast feeding and baby is latching on much better. States he menstrual flow is moderate. PhQ9=3. Call transferred to Dani Gobble CNM to complete televisit.

## 2020-03-08 NOTE — Progress Notes (Signed)
Virtual Visit via Telephone Note  I connected with Tina Hughes on 03/08/20 at  4:15 PM EDT by telephone and verified that I am speaking with the correct person using two identifiers.  Location:  Patient: Tina Hughes (home)  Provider: Dani Gobble, CNM ([facility], [facility])   I discussed the limitations, risks, security and privacy concerns of performing an evaluation and management service by telephone and the availability of in person appointments. I also discussed with the patient that there may be a patient responsible charge related to this service. The patient expressed understanding and agreed to proceed.   History of Present Illness:  Patient is two (2) week postpartum spontaneous vaginal birth and bilateral tubal ligation.   Doing well overall, seen in office last week; for further details of that visit, see chart. Took single dose of Zoloft and stopped.   Breastfeeding without difficulty.   Preforming Kegels daily.   Older children are transitioning well with newborn.   Denies difficulty breathing or respiratory distress, chest pain, abdominal pain, excessive vaginal bleeding, dysuria, and leg pain or swelling.     Observations/Objective:  Depression screen [facility] 2/9 03/06/2020 02/25/2020 11/23/2017 10/26/2017 10/19/2017  Decreased Interest 0 1 0 0 1  Down, Depressed, Hopeless 0 1 0 0 1  PHQ - 2 Score 0 2 0 0 2  Altered sleeping 0 1 0 0 0  Tired, decreased energy 1 1 0 1 1  Change in appetite 1 2 0 - 1  Feeling bad or failure about yourself  0 1 0 0 2  Trouble concentrating 1 2 1  0 2  Moving slowly or fidgety/restless 0 1 0 0 1  Suicidal thoughts 0 0 0 0 0  PHQ-9 Score 3 10 1 1 9   Difficult doing work/chores Not difficult at all Somewhat difficult - - -     Assessment and Plan:  Postpartum care and examination Lactating mother Depression screen negative  Follow Up Instructions:  RTC for PPV as scheduled or sooner if needed.   I discussed  the assessment and treatment plan with the patient. The patient was provided an opportunity to ask questions and all were answered. The patient agreed with the plan and demonstrated an understanding of the instructions.   The patient was advised to call back or seek an in-person evaluation if the symptoms worsen or if the condition fails to improve as anticipated.  I provided 6 minutes of non-face-to-face time during this encounter.   Dani Gobble, CNM  [facility], [facility]

## 2020-03-10 ENCOUNTER — Encounter: Payer: Self-pay | Admitting: Gastroenterology

## 2020-03-10 DIAGNOSIS — R1013 Epigastric pain: Secondary | ICD-10-CM

## 2020-03-11 NOTE — Telephone Encounter (Signed)
Does patient need follow up appointment or does she need labs, scan or procedure

## 2020-03-11 NOTE — Telephone Encounter (Signed)
Patient is scheduled for her ultrasound on 03/17/2020 [facility] to [facility] [place] [facility] at 9:15am for a 9:30am scan. Nothing to eat or drink after midnight. Patient verbalized understanding. Made follow up appointment also

## 2020-03-17 ENCOUNTER — Other Ambulatory Visit: Payer: Self-pay

## 2020-03-17 ENCOUNTER — Ambulatory Visit
Admission: RE | Admit: 2020-03-17 | Discharge: 2020-03-17 | Disposition: A | Discharge status: Home / Self Care | Payer: Medicaid Other | Source: Ambulatory Visit | Attending: Gastroenterology | Admitting: Gastroenterology

## 2020-03-17 DIAGNOSIS — R1013 Epigastric pain: Secondary | ICD-10-CM | POA: Diagnosis not present

## 2020-03-19 ENCOUNTER — Encounter: Payer: Self-pay | Admitting: Gastroenterology

## 2020-03-23 ENCOUNTER — Encounter: Payer: Self-pay | Admitting: Certified Nurse Midwife

## 2020-03-23 ENCOUNTER — Ambulatory Visit (INDEPENDENT_AMBULATORY_CARE_PROVIDER_SITE_OTHER): Payer: Medicaid Other | Admitting: Certified Nurse Midwife

## 2020-03-23 DIAGNOSIS — Z8659 Personal history of other mental and behavioral disorders: Secondary | ICD-10-CM

## 2020-03-23 NOTE — Patient Instructions (Signed)
Preventive Care 20-31 Years Old, Female Preventive care refers to visits with your health care provider and lifestyle choices that can promote health and wellness. This includes:  A yearly physical exam. This may also be called an annual well check.  Regular dental visits and eye exams.  Immunizations.  Screening for certain conditions.  Healthy lifestyle choices, such as eating a healthy diet, getting regular exercise, not using drugs or products that contain nicotine and tobacco, and limiting alcohol use. What can I expect for my preventive care visit? Physical exam Your health care provider will check your:  Height and weight. This may be used to calculate body mass index (BMI), which tells if you are at a healthy weight.  Heart rate and blood pressure.  Skin for abnormal spots. Counseling Your health care provider may ask you questions about your:  Alcohol, tobacco, and drug use.  Emotional well-being.  Home and relationship well-being.  Sexual activity.  Eating habits.  Work and work Statistician.  Method of birth control.  Menstrual cycle.  Pregnancy history. What immunizations do I need?  Influenza (flu) vaccine  This is recommended every year. Tetanus, diphtheria, and pertussis (Tdap) vaccine  You may need a Td booster every 10 years. Varicella (chickenpox) vaccine  You may need this if you have not been vaccinated. Human papillomavirus (HPV) vaccine  If recommended by your health care provider, you may need three doses over 6 months. Measles, mumps, and rubella (MMR) vaccine  You may need at least one dose of MMR. You may also need a second dose. Meningococcal conjugate (MenACWY) vaccine  One dose is recommended if you are age 75-21 years and a first-year college student living in a residence hall, or if you have one of several medical conditions. You may also need additional booster doses. Pneumococcal conjugate (PCV13) vaccine  You may need  this if you have certain conditions and were not previously vaccinated. Pneumococcal polysaccharide (PPSV23) vaccine  You may need one or two doses if you smoke cigarettes or if you have certain conditions. Hepatitis A vaccine  You may need this if you have certain conditions or if you travel or work in places where you may be exposed to hepatitis A. Hepatitis B vaccine  You may need this if you have certain conditions or if you travel or work in places where you may be exposed to hepatitis B. Haemophilus influenzae type b (Hib) vaccine  You may need this if you have certain conditions. You may receive vaccines as individual doses or as more than one vaccine together in one shot (combination vaccines). Talk with your health care provider about the risks and benefits of combination vaccines. What tests do I need?  Blood tests  Lipid and cholesterol levels. These may be checked every 5 years starting at age 33.  Hepatitis C test.  Hepatitis B test. Screening  Diabetes screening. This is done by checking your blood sugar (glucose) after you have not eaten for a while (fasting).  Sexually transmitted disease (STD) testing.  BRCA-related cancer screening. This may be done if you have a family history of breast, ovarian, tubal, or peritoneal cancers.  Pelvic exam and Pap test. This may be done every 3 years starting at age 76. Starting at age 102, this may be done every 5 years if you have a Pap test in combination with an HPV test. Talk with your health care provider about your test results, treatment options, and if necessary, the need for more tests.  Follow these instructions at home: Eating and drinking   Eat a diet that includes fresh fruits and vegetables, whole grains, lean protein, and low-fat dairy.  Take vitamin and mineral supplements as recommended by your health care provider.  Do not drink alcohol if: ? Your health care provider tells you not to drink. ? You are  pregnant, may be pregnant, or are planning to become pregnant.  If you drink alcohol: ? Limit how much you have to 0-1 drink a day. ? Be aware of how much alcohol is in your drink. In the [place] one drink equals one 12 oz bottle of beer (355 mL), one 5 oz glass of wine (148 mL), or one 1 oz glass of hard liquor (44 mL). Lifestyle  Take daily care of your teeth and gums.  Stay active. Exercise for at least 30 minutes on 5 or more days each week.  Do not use any products that contain nicotine or tobacco, such as cigarettes, e-cigarettes, and chewing tobacco. If you need help quitting, ask your health care provider.  If you are sexually active, practice safe sex. Use a condom or other form of birth control (contraception) in order to prevent pregnancy and STIs (sexually transmitted infections). If you plan to become pregnant, see your health care provider for a preconception visit. What's next?  Visit your health care provider once a year for a well check visit.  Ask your health care provider how often you should have your eyes and teeth checked.  Stay up to date on all vaccines. This information is not intended to replace advice given to you by your health care provider. Make sure you discuss any questions you have with your health care provider. Document Revised: 04/19/2018 Document Reviewed: 04/19/2018 Elsevier Patient Education  2020 [employer].

## 2020-03-23 NOTE — Progress Notes (Signed)
Patient comes in today for 6 week PPV . PHQ9 and Gad7 was negative.

## 2020-03-23 NOTE — Progress Notes (Signed)
Subjective:    Tina Hughes is a 29 y.o. G67P3003 Caucasian female who presents for a postpartum visit. She is 6 weeks postpartum following a spontaneous vaginal delivery at 40+1 gestational weeks. Anesthesia: epidural. I have fully reviewed the prenatal and intrapartum course.   Postpartum course has been uncomplicated.   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 Baby is feeding by breast and formula supplementation.   Bleeding no bleeding. Bowel function is normal. Bladder function is normal.   Patient is sexually active. Last sexual activity: yesterday. Contraception method is tubal ligation.   Postpartum depression screening: negative. Score 0.    Last pap 05/2018 and was negative.  Denies difficulty breathing or respiratory distress, chest pain, abdominal pain, excessive vaginal bleeding, dysuria, and leg pain or swelling.   The following portions of the patient's history were reviewed and updated as appropriate: allergies, current medications, past medical history, past surgical history and problem list.  Review of Systems  Pertinent items are noted in HPI.   Objective:   BP (!) 132/83   Pulse 69   Ht 5\' 6"  (1.676 m)   Wt 139 lb 9.6 oz (63.3 kg)   BMI 22.53 kg/m   General:  alert, cooperative and no distress   Breasts:  deferred, no complaints  Lungs: clear to auscultation bilaterally  Heart:  regular rate and rhythm  Abdomen: soft, nontender   Vulva: normal  Vagina: normal vagina  Cervix:  closed  Corpus: Well-involuted  Adnexa:  Non-palpable       Depression screen [facility] 2/9 03/23/2020 03/06/2020 02/25/2020 11/23/2017 10/26/2017  Decreased Interest 0 0 1 0 0  Down, Depressed, Hopeless 0 0 1 0 0  PHQ - 2 Score 0 0 2 0 0  Altered sleeping 0 0 1 0 0  Tired, decreased energy 0 1 1 0 1  Change in appetite 0 1 2 0 -  Feeling bad or failure about yourself  0 0 1 0 0  Trouble concentrating 0 1 2 1  0  Moving slowly or fidgety/restless 0 0 1 0 0  Suicidal  thoughts 0 0 0 0 0  PHQ-9 Score 0 3 10 1 1   Difficult doing work/chores Not difficult at all Not difficult at all Somewhat difficult - -   GAD 7 : Generalized Anxiety Score 03/23/2020  Nervous, Anxious, on Edge 0  Control/stop worrying 0  Worry too much - different things 0  Trouble relaxing 0  Restless 0  Easily annoyed or irritable 0  Afraid - awful might happen 0  Total GAD 7 Score 0  Anxiety Difficulty Not difficult at all    Assessment:   Postpartum exam Six (6) wks s/p spontaneous vaginal birth Breastfeeding with formula supplementation Depression screening Contraception counseling   Plan:   Encouraged routine health maintenance techniques.   Reviewed red flag symptoms and when to call.  Follow up in: 2-3 months for [facility] or earlier if needed.   Dani Gobble, CNM [facility], [facility] 03/23/20 11:21 AM

## 2020-05-14 ENCOUNTER — Encounter: Payer: Self-pay | Admitting: *Deleted

## 2020-05-14 ENCOUNTER — Ambulatory Visit: Payer: Medicaid Other | Admitting: Gastroenterology

## 2020-06-11 ENCOUNTER — Encounter: Payer: Medicaid Other | Admitting: Certified Nurse Midwife

## 2020-08-03 ENCOUNTER — Encounter: Payer: Medicaid Other | Admitting: Certified Nurse Midwife

## 2020-10-24 ENCOUNTER — Encounter: Payer: Self-pay | Admitting: Emergency Medicine

## 2020-10-24 ENCOUNTER — Ambulatory Visit
Admission: EM | Admit: 2020-10-24 | Discharge: 2020-10-24 | Disposition: A | Discharge status: Home / Self Care | Payer: Medicaid Other | Attending: Sports Medicine | Admitting: Sports Medicine

## 2020-10-24 ENCOUNTER — Other Ambulatory Visit: Payer: Self-pay

## 2020-10-24 DIAGNOSIS — H938X3 Other specified disorders of ear, bilateral: Secondary | ICD-10-CM | POA: Diagnosis not present

## 2020-10-24 DIAGNOSIS — J029 Acute pharyngitis, unspecified: Secondary | ICD-10-CM | POA: Insufficient documentation

## 2020-10-24 LAB — GROUP A STREP BY PCR: Group A Strep by PCR: NOT DETECTED

## 2020-10-24 NOTE — ED Provider Notes (Signed)
[facility]    CSN: [id number] Arrival date & time: 10/24/20  1557      History   Chief Complaint Chief Complaint  Patient presents with  . Sore Throat    HPI Tina Hughes is a 30 y.o. female.   Pleasant 30 year old female who presents for evaluation the above issue.  She reports having a sore throat since yesterday with some mild rhinorrhea.  She says at times when she swallows it feels like she is swallowing glass.  She denies any fever shakes chills.  A little bit of ear fullness.  No significant sinus pressure.  No abdominal issues or urinary issues.  No chest pain or shortness of breath.  She says she is a little bit more fatigued than normal.  She has had no COVID or strep exposure.  She did have COVID back in October 2021.  She has not been vaccinated.  No flu shot.  No other issues or problems are offered.  No red flag signs or symptoms elicited on history.     Past Medical History:  Diagnosis Date  . Anxiety   . Vegetarian     Patient Active Problem List   Diagnosis Date Noted  . History of gestational hypertension 10/11/2017  . Anxiety 03/31/2017  . Vegetarian diet 03/31/2017    Past Surgical History:  Procedure Laterality Date  . TUBAL LIGATION Bilateral 02/22/2020   Procedure: POST PARTUM TUBAL LIGATION;  Surgeon: Rubie Maid, MD;  Location: [facility] ORS;  Service: Gynecology;  Laterality: Bilateral;    OB History    Gravida  3   Para  3   Term  3   Preterm      AB      Living  3     SAB      IAB      Ectopic      Multiple  0   Live Births  3            Home Medications    Prior to Admission medications   Medication Sig Start Date End Date Taking? Authorizing Provider  prenatal vitamin w/FE, FA (PRENATAL 1 + 1) 27-1 MG TABS tablet Take 1 tablet by mouth daily at 12 noon.   Yes [provider]  sertraline (ZOLOFT) 25 MG tablet Take 1 tablet (25 mg total) by mouth daily. Patient not taking: No sig reported  02/25/20   Philip Aspen, CNM    Family History Family History  Problem Relation Age of Onset  . Cancer Paternal Grandmother        skin cancer  . Irritable bowel syndrome Mother   . Breast cancer Neg Hx   . Ovarian cancer Neg Hx   . Colon cancer Neg Hx     Social History Social History   Tobacco Use  . Smoking status: Former Research scientist (life sciences)  . Smokeless tobacco: Never Used  Vaping Use  . Vaping Use: Never used  Substance Use Topics  . Alcohol use: Not Currently    Comment: occasional  . Drug use: No     Allergies   Patient has no known allergies.   Review of Systems Review of Systems  Constitutional: Positive for fatigue. Negative for activity change, appetite change, chills, diaphoresis and fever.  HENT: Positive for ear pain, rhinorrhea, sore throat and trouble swallowing. Negative for congestion, ear discharge, postnasal drip, sinus pressure, sinus pain and sneezing.   Eyes: Negative.   Respiratory: Negative for cough, chest tightness, shortness of  breath, wheezing and stridor.   Cardiovascular: Negative for chest pain and palpitations.  Gastrointestinal: Negative.   Genitourinary: Negative.   Musculoskeletal: Negative.   Skin: Negative.   Neurological: Negative for dizziness, seizures, syncope, weakness, light-headedness and headaches.  All other systems reviewed and are negative.    Physical Exam Triage Vital Signs ED Triage Vitals  Enc Vitals Group     BP 10/24/20 1613 123/68     Pulse Rate 10/24/20 1613 91     Resp 10/24/20 1613 14     Temp 10/24/20 1613 98 F (36.7 C)     Temp Source 10/24/20 1613 Oral     SpO2 10/24/20 1613 100 %     Weight 10/24/20 1606 125 lb (56.7 kg)     Height 10/24/20 1606 5\' 6"  (1.676 m)     Head Circumference --      Peak Flow --      Pain Score 10/24/20 1606 6     Pain Loc --      Pain Edu? --      Excl. in [place]? --    No data found.  Updated Vital Signs BP 123/68 (BP Location: Left Arm)   Pulse 91   Temp 98 F (36.7  C) (Oral)   Resp 14   Ht 5\' 6"  (1.676 m)   Wt 56.7 kg   SpO2 100%   Breastfeeding Yes   BMI 20.18 kg/m   Visual Acuity Right Eye Distance:   Left Eye Distance:   Bilateral Distance:    Right Eye Near:   Left Eye Near:    Bilateral Near:     Physical Exam Vitals and nursing note reviewed.  Constitutional:      General: She is not in acute distress.    Appearance: She is well-developed. She is not ill-appearing or toxic-appearing.  HENT:     Head: Normocephalic and atraumatic.     Right Ear: Tympanic membrane normal.     Left Ear: Tympanic membrane normal.     Nose: Rhinorrhea present. No congestion.     Mouth/Throat:     Mouth: Mucous membranes are moist. No oral lesions.     Pharynx: Oropharynx is clear. Posterior oropharyngeal erythema present. No pharyngeal swelling, oropharyngeal exudate or uvula swelling.     Tonsils: No tonsillar exudate or tonsillar abscesses. 0 on the right. 0 on the left.  Eyes:     Conjunctiva/sclera: Conjunctivae normal.     Pupils: Pupils are equal, round, and reactive to light.  Neck:     Thyroid: No thyromegaly.  Cardiovascular:     Rate and Rhythm: Normal rate.     Heart sounds: Normal heart sounds. No murmur heard. No friction rub. No gallop.   Pulmonary:     Effort: Pulmonary effort is normal. No respiratory distress.     Breath sounds: Normal breath sounds. No stridor. No wheezing, rhonchi or rales.  Musculoskeletal:     Cervical back: Normal range of motion and neck supple.  Lymphadenopathy:     Cervical: Cervical adenopathy present.  Skin:    General: Skin is warm and dry.     Capillary Refill: Capillary refill takes less than 2 seconds.  Neurological:     General: No focal deficit present.     Mental Status: She is alert and oriented to person, place, and time.      UC Treatments / Results  Labs (all labs ordered are listed, but only abnormal results are displayed) Labs Reviewed  GROUP A STREP BY PCR     EKG   Radiology No results found.  Procedures Procedures (including critical care time)  Medications Ordered in UC Medications - No data to display  Initial Impression / Assessment and Plan / UC Course  I have reviewed the triage vital signs and the nursing notes.  Pertinent labs & imaging results that were available during my care of the patient were reviewed by me and considered in my medical decision making (see chart for details).  Clinical impression:  30 year old female with 2 days of a sore throat and pain on swallowing with some mild ear fullness.  Examination vitals are reassuring.  There is some erythema in the posterior oropharynx.  No exudate appreciated.  Treatment plan: 1.  The findings and treatment plan were discussed in detail with the patient.  Patient was in agreement. 2.  I long discussion with her regarding pharyngitis and that the majority are viral.  That said we will go ahead and test her for strep throat.  Given her recent COVID infection and normal vital signs I do not feel the need to test for Covid today.  Strep test was negative. 3.  Educational handout provided. 4.  Over-the-counter meds as needed.  She is breast-feeding so she needs to be careful with what medication she takes and she voiced verbal understanding. 5.  If symptoms were to worsen she should seek out the care of her primary care physician, come back here, or go to the ER. 6.  Follow-up as needed.    Final Clinical Impressions(s) / UC Diagnoses   Final diagnoses:  Viral pharyngitis  Ear fullness, bilateral     Discharge Instructions     Your strep test was negative. Please see educational handout. Over-the-counter meds as needed but if you are breast-feeding just make sure that they are appropriate. If you have any worsening symptoms then please call your primary care physician or come back here or go to the ER.  I hope you get feeling better, Dr. Drema Dallas    ED  Prescriptions    None     PDMP not reviewed this encounter.   Verda Cumins, MD 10/24/20 [phone]

## 2020-10-24 NOTE — ED Triage Notes (Signed)
Patient c/o runny nose, sore throat that started yesterday.  Patient denies fevers.

## 2020-10-24 NOTE — Discharge Instructions (Addendum)
Your strep test was negative. Please see educational handout. Over-the-counter meds as needed but if you are breast-feeding just make sure that they are appropriate. If you have any worsening symptoms then please call your primary care physician or come back here or go to the ER.  I hope you get feeling better, Dr. Drema Dallas

## 2021-07-23 ENCOUNTER — Other Ambulatory Visit: Payer: Self-pay

## 2021-07-23 ENCOUNTER — Ambulatory Visit (INDEPENDENT_AMBULATORY_CARE_PROVIDER_SITE_OTHER): Payer: Medicaid Other | Admitting: Gastroenterology

## 2021-07-23 ENCOUNTER — Encounter: Payer: Self-pay | Admitting: Gastroenterology

## 2021-07-23 VITALS — BP 142/90 | HR 80 | Temp 97.3°F | Ht 66.0 in | Wt 142.8 lb

## 2021-07-23 DIAGNOSIS — R1013 Epigastric pain: Secondary | ICD-10-CM

## 2021-07-23 NOTE — Progress Notes (Signed)
Tina Darby, MD [address]  [place]  [place], [place] [address]  Main: [phone]  Fax: [phone]    Gastroenterology Consultation  Referring Provider:     Sharlette Dense* Primary Care Physician:  Diona Fanti, CNM Primary Gastroenterologist:  Dr. Cephas Hughes Reason for Consultation: Family history of GI problem, epigastric pain        HPI:   Tina Hughes is a 30 y.o. female referred by Dr. Verdene Rio, Lara Mulch, CNM  for consultation & management of family history of GI problem.  Patient is currently [redacted] weeks pregnant, in her third pregnancy.  When she was 28 weeks of gestation, she experienced epigastric pain radiating to the back which was self-limited.  She reports her previous 2 pregnancies were uneventful and normal.  She is concerned if she has any underlying GI problem because of her mother was diagnosed with intestinal malrotation, had to undergo multiple surgeries most recently.  Patient has heartburn and mild epigastric pain for which she takes Tums, provides relief.  She also has constipation for which she takes prune juice and coffee, keeps her bowels regular.  This pregnancy so far has been uneventful.  Her labs including CBC normal.  Mild elevated alkaline phosphatase in 09/2017 during her second pregnancy  Follow-up visit 07/23/2021 Patient is here for follow-up of epigastric pain.  She delivered a baby boy last year since her last visit with me.  Her pregnancy and delivery were uneventful.  Baby is doing well.  Patient reports that about a month ago, she had sudden onset of sharp epigastric pain associated with significant amount of bloating which was partially relieved after having a bowel movement.  This was not triggered after a meal.  Denies any nausea or vomiting.  Denies any similar episodes since then.  Her weight has been stable.  She does not have any other GI concerns.  She is interested to undergo imaging study as  discussed during previous visit  NSAIDs: None  Antiplts/Anticoagulants/Anti thrombotics: None  GI Procedures: None  Past Medical History:  Diagnosis Date   Anxiety    Vegetarian     Past Surgical History:  Procedure Laterality Date   TUBAL LIGATION Bilateral 02/22/2020   Procedure: POST PARTUM TUBAL LIGATION;  Surgeon: Rubie Maid, MD;  Location: [facility] OR[facility];  Service: Gynecology;  Laterality: Bilateral;    Current Outpatient Medications:    prenatal vitamin w/FE, FA (PRENATAL 1 + 1) 27-1 MG TABS tablet, Take 1 tablet by mouth daily at 12 noon., Disp: , Rfl:    sertraline (ZOLOFT) 25 MG tablet, Take 1 tablet (25 mg total) by mouth daily. (Patient not taking: No sig reported), Disp: 30 tablet, Rfl: 1   Family History  Problem Relation Age of Onset   Cancer Paternal Grandmother        skin cancer   Irritable bowel syndrome Mother    Breast cancer Neg Hx    Ovarian cancer Neg Hx    Colon cancer Neg Hx      Social History   Tobacco Use   Smoking status: Former   Smokeless tobacco: Never  Vaping Use   Vaping Use: Never used  Substance Use Topics   Alcohol use: Not Currently    Comment: occasional   Drug use: No    Allergies as of 07/23/2021   (No Known Allergies)    Review of Systems:    All systems reviewed and negative except where noted in HPI.  Physical Exam:  BP (!) 142/90 (BP Location: Left Arm, Patient Position: Sitting, Cuff Size: Normal)   Pulse 80   Temp (!) 97.3 F (36.3 C) (Temporal)   Ht 5\' 6"  (1.676 m)   Wt 142 lb 12.8 oz (64.8 kg)   BMI 23.05 kg/m  No LMP recorded.  General:   Alert,  Well-developed, well-nourished, pleasant and cooperative in NAD Head:  Normocephalic and atraumatic. Eyes:  Sclera clear, no icterus.   Conjunctiva pink. Ears:  Normal auditory acuity. Nose:  No deformity, discharge, or lesions. Mouth:  No deformity or lesions,oropharynx pink & moist. Neck:  Supple; no masses or thyromegaly. Lungs:  Respirations even and  unlabored.  Clear throughout to auscultation.   No wheezes, crackles, or rhonchi. No acute distress. Heart:  Regular rate and rhythm; no murmurs, clicks, rubs, or gallops. Abdomen:  Normal bowel sounds. Soft, non-tender and non-distended without masses, hepatosplenomegaly or hernias noted.  No guarding or rebound tenderness.   Rectal: Not performed Msk:  Symmetrical without gross deformities. Good, equal movement & strength bilaterally. Pulses:  Normal pulses noted. Extremities:  No clubbing or edema.  No cyanosis. Neurologic:  Alert and oriented x3;  grossly normal neurologically. Skin:  Intact without significant lesions or rashes. No jaundice. Psych:  Alert and cooperative. Normal mood and affect.  Imaging Studies: No recent abdominal imaging  Assessment and Plan:   [name] is a 30 y.o. female with family history of intestinal malrotation in her mother, self-limited episode of sharp epigastric pain associated with abdominal bloating  Recommend CT abdomen pelvis with contrast for further evaluation If her symptoms of epigastric pain are recurrent and CT negative, recommend EGD   Follow up as needed   Tina Darby, MD

## 2021-07-26 ENCOUNTER — Telehealth: Payer: Self-pay

## 2021-07-26 NOTE — Telephone Encounter (Signed)
Ct scheduled for outpatient center 08/11/2021 At 2:30  npo 4 hours prior   Patient agrees and I let patient know to go by and pick up prep before appointment

## 2021-08-11 ENCOUNTER — Ambulatory Visit
Admission: RE | Admit: 2021-08-11 | Discharge: 2021-08-11 | Disposition: A | Discharge status: Home / Self Care | Payer: Medicaid Other | Source: Ambulatory Visit | Attending: Gastroenterology | Admitting: Gastroenterology

## 2021-08-11 ENCOUNTER — Other Ambulatory Visit: Payer: Self-pay

## 2021-08-11 DIAGNOSIS — R1013 Epigastric pain: Secondary | ICD-10-CM | POA: Diagnosis present

## 2021-08-11 MED ORDER — IOHEXOL 300 MG/ML  SOLN
80.0000 mL | Freq: Once | INTRAMUSCULAR | Status: AC | PRN
  Administered 2021-08-11: 15:00:00 80 mL via INTRATHECAL

## 2021-08-12 ENCOUNTER — Encounter: Payer: Self-pay | Admitting: Gastroenterology

## 2021-09-01 ENCOUNTER — Encounter: Payer: Self-pay | Admitting: Family

## 2021-09-01 ENCOUNTER — Other Ambulatory Visit: Payer: Self-pay

## 2021-09-01 ENCOUNTER — Encounter: Payer: Medicaid Other | Admitting: Family

## 2021-09-01 NOTE — Progress Notes (Signed)
Pt presents to establish care 

## 2021-09-01 NOTE — Progress Notes (Signed)
Erroneous encounter

## 2021-09-22 ENCOUNTER — Ambulatory Visit: Payer: Medicaid Other | Admitting: Adult Health

## 2021-10-07 ENCOUNTER — Ambulatory Visit: Payer: Medicaid Other | Admitting: Family

## 2021-11-21 ENCOUNTER — Encounter: Payer: Self-pay | Admitting: Emergency Medicine

## 2021-11-21 ENCOUNTER — Ambulatory Visit
Admission: EM | Admit: 2021-11-21 | Discharge: 2021-11-21 | Disposition: A | Discharge status: Home / Self Care | Payer: Medicaid Other | Attending: Emergency Medicine | Admitting: Emergency Medicine

## 2021-11-21 ENCOUNTER — Other Ambulatory Visit: Payer: Self-pay

## 2021-11-21 DIAGNOSIS — J069 Acute upper respiratory infection, unspecified: Secondary | ICD-10-CM | POA: Diagnosis not present

## 2021-11-21 DIAGNOSIS — H109 Unspecified conjunctivitis: Secondary | ICD-10-CM | POA: Diagnosis not present

## 2021-11-21 MED ORDER — AMOXICILLIN-POT CLAVULANATE 875-125 MG PO TABS: 1.0000 | ORAL_TABLET | Freq: Two times a day (BID) | ORAL | 0 refills | Status: DC

## 2021-11-21 MED ORDER — POLYMYXIN B-TRIMETHOPRIM 10000-0.1 UNIT/ML-% OP SOLN: 1.0000 [drp] | OPHTHALMIC | 0 refills | Status: DC

## 2021-11-21 NOTE — ED Triage Notes (Signed)
Patient c/o sinus pressure and pain, nasal drainage and congestion and cough that started a week ago.  Patient denies fevers.  ?

## 2021-11-21 NOTE — ED Provider Notes (Signed)
?[place] ? ? ? ?CSN: [id number] ?Arrival date & time: 11/21/21  0175 ? ? ?  ? ?History   ?Chief Complaint ?Chief Complaint  ?Patient presents with  ? Sinus Problem  ? Cough  ? ? ?HPI ?Tina Hughes is a 31 y.o. female.  ? ?Patient presents with nasal congestion, rhinorrhea, sinus pressure, productive cough with green sputum and generalized headaches for 7 days, began to have left eye discharge and redness  this morning.  Known sick contact in household.  Has attempted use of antihistamine once which was somewhat helpful.  Tolerating food and liquids.  No pertinent medical history.   ? ?Past Medical History:  ?Diagnosis Date  ? Anxiety   ? Vegetarian   ? ? ?Patient Active Problem List  ? Diagnosis Date Noted  ? History of gestational hypertension 10/11/2017  ? Anxiety 03/31/2017  ? ? ?Past Surgical History:  ?Procedure Laterality Date  ? TUBAL LIGATION Bilateral 02/22/2020  ? Procedure: POST PARTUM TUBAL LIGATION;  Surgeon: Rubie Maid, MD;  Location: [facility];  Service: Gynecology;  Laterality: Bilateral;  ? ? ?OB History   ? ? Gravida  ?3  ? Para  ?3  ? Term  ?3  ? Preterm  ?   ? AB  ?   ? Living  ?3  ?  ? ? SAB  ?   ? IAB  ?   ? Ectopic  ?   ? Multiple  ?0  ? Live Births  ?3  ?   ?  ?  ? ? ? ?Home Medications   ? ?Prior to Admission medications   ?Medication Sig Start Date End Date Taking? Authorizing Provider  ?prenatal vitamin w/FE, FA (PRENATAL 1 + 1) 27-1 MG TABS tablet Take 1 tablet by mouth daily at 12 noon.    [provider]  ? ? ?Family History ?Family History  ?Problem Relation Age of Onset  ? Cancer Paternal Grandmother   ?     skin cancer  ? Irritable bowel syndrome Mother   ? Breast cancer Neg Hx   ? Ovarian cancer Neg Hx   ? Colon cancer Neg Hx   ? ? ?Social History ?Social History  ? ?Tobacco Use  ? Smoking status: Former  ? Smokeless tobacco: Never  ?Vaping Use  ? Vaping Use: Never used  ?Substance Use Topics  ? Alcohol use: Not Currently  ?  Comment: occasional  ? Drug  use: No  ? ? ? ?Allergies   ?Patient has no known allergies. ? ? ?Review of Systems ?Review of Systems  ?Constitutional: Negative.   ?HENT:  Positive for congestion, rhinorrhea, sinus pressure and sore throat. Negative for dental problem, drooling, ear discharge, ear pain, facial swelling, hearing loss, mouth sores, nosebleeds, postnasal drip, sinus pain, sneezing, tinnitus, trouble swallowing and voice change.   ?Eyes:  Positive for discharge and redness. Negative for photophobia, pain, itching and visual disturbance.  ?Respiratory:  Positive for cough. Negative for apnea, choking, chest tightness, shortness of breath, wheezing and stridor.   ?Cardiovascular: Negative.   ?Gastrointestinal: Negative.   ?Skin: Negative.   ?Neurological:  Positive for headaches. Negative for dizziness, tremors, seizures, syncope, facial asymmetry, speech difficulty, weakness, light-headedness and numbness.  ? ? ?Physical Exam ?Triage Vital Signs ?ED Triage Vitals  ?Enc Vitals Group  ?   BP 11/21/21 0911 120/90  ?   Pulse Rate 11/21/21 0911 74  ?   Resp 11/21/21 0911 14  ?   Temp 11/21/21  0911 98.3 ?F (36.8 ?C)  ?   Temp Source 11/21/21 0911 Oral  ?   SpO2 11/21/21 0911 100 %  ?   Weight 11/21/21 0910 155 lb (70.3 kg)  ?   Height 11/21/21 0910 '5\' 6"'$  (1.676 m)  ?   Head Circumference --   ?   Peak Flow --   ?   Pain Score 11/21/21 0910 0  ?   Pain Loc --   ?   Pain Edu? --   ?   Excl. in [place]? --   ? ?No data found. ? ?Updated Vital Signs ?BP 120/90 (BP Location: Left Arm)   Pulse 74   Temp 98.3 ?F (36.8 ?C) (Oral)   Resp 14   Ht '5\' 6"'$  (1.676 m)   Wt 155 lb (70.3 kg)   LMP 11/14/2021 (Approximate)   SpO2 100%   Breastfeeding Yes   BMI 25.02 kg/m?  ? ?Visual Acuity ?Right Eye Distance:   ?Left Eye Distance:   ?Bilateral Distance:   ? ?Right Eye Near:   ?Left Eye Near:    ?Bilateral Near:    ? ?Physical Exam ?Constitutional:   ?   Appearance: Normal appearance. She is normal weight.  ?HENT:  ?   Head: Normocephalic.  ?   Right Ear:  Tympanic membrane, ear canal and external ear normal.  ?   Left Ear: Tympanic membrane, ear canal and external ear normal.  ?   Nose: Congestion present. No rhinorrhea.  ?   Mouth/Throat:  ?   Mouth: Mucous membranes are moist.  ?   Pharynx: Oropharynx is clear.  ?Eyes:  ?   Extraocular Movements: Extraocular movements intact.  ?Cardiovascular:  ?   Rate and Rhythm: Normal rate and regular rhythm.  ?   Pulses: Normal pulses.  ?   Heart sounds: Normal heart sounds.  ?Pulmonary:  ?   Effort: Pulmonary effort is normal.  ?   Breath sounds: Normal breath sounds.  ?Musculoskeletal:  ?   Cervical back: Normal range of motion and neck supple.  ?Skin: ?   General: Skin is warm and dry.  ?Neurological:  ?   Mental Status: She is alert and oriented to person, place, and time. Mental status is at baseline.  ?Psychiatric:     ?   Mood and Affect: Mood normal.     ?   Behavior: Behavior normal.  ? ? ? ?UC Treatments / Results  ?Labs ?(all labs ordered are listed, but only abnormal results are displayed) ?Labs Reviewed - No data to display ? ?EKG ? ? ?Radiology ?No results found. ? ?Procedures ?Procedures (including critical care time) ? ?Medications Ordered in UC ?Medications - No data to display ? ?Initial Impression / Assessment and Plan / UC Course  ?I have reviewed the triage vital signs and the nursing notes. ? ?Pertinent labs & imaging results that were available during my care of the patient were reviewed by me and considered in my medical decision making (see chart for details). ? ?Upper respiratory infection ?Bacterial conjunctivitis of the left eye ? ?Vital signs are stable and patient well appearing fatigued is in no signs of distress.  As symptoms have been present for 7 days, no will cover for bacteria, Augmentin 7-day course prescribed, may use over-the-counter medication for additional support, for conjunctivitis of eyes, Polytrim 7-day course prescribed, discussed administration, advised use of oral  antihistamines if itching occurs and cool compresses and napkins to remove drainage to prevent spread, may follow-up  with urgent care for persisting symptoms ?Final Clinical Impressions(s) / UC Diagnoses  ? ?Final diagnoses:  ?None  ? ?Discharge Instructions   ?None ?  ? ?ED Prescriptions   ?None ?  ? ?PDMP not reviewed this encounter. ?  ?Hans Eden, NP ?11/21/21 1219 ? ?

## 2021-11-21 NOTE — Discharge Instructions (Signed)
Today you being treated for bacterial conjunctivitis.  ? ?Place one drop of polytrim into the effected eye every 4 hours while awake for 7 days. If the other eye starts to have symptoms you may use medication in it as well. Do not allow tip of dropper to touch eye. ? ?May use cool compress for comfort and to remove discharge if present. Pat the eye, do not wipe. ? ?Do not rub eyes, this may cause more irritation. ? ?May use benadryl as needed to help if itching present. ? ?Please avoid use of eye makeup until symptoms clear. ? ?If symptoms persist after use of medication, please follow up at Urgent Care or with ophthalmologist (eye doctor)  ? ?For your cough and congestion ? ?As symptoms have been present for 7 days we will attempt to cover for bacteria ? ?Take Augmentin twice daily for the next 7 days ?  ?For cough: honey 1/2 to 1 teaspoon (you can dilute the honey in water or another fluid).  You can also use guaifenesin and dextromethorphan for cough. You can use a humidifier for chest congestion and cough.  If you don't have a humidifier, you can sit in the bathroom with the hot shower running.    ? ?  ?For congestion: take a daily anti-histamine like Zyrtec, Claritin, and a oral decongestant, such as pseudoephedrine.  You can also use Flonase 1-2 sprays in each nostril daily. ?  ?It is important to stay hydrated: drink plenty of fluids (water, gatorade/powerade/pedialyte, juices, or teas) to keep your throat moisturized and help further relieve irritation/discomfort. ? ?

## 2021-12-03 IMAGING — US US ABDOMEN LIMITED
1 series · 14 of 25 positions shown · non-contrast
Comparison: None.

CLINICAL DATA: Epigastric abdominal pain for 1 year.

EXAM:
ULTRASOUND ABDOMEN LIMITED RIGHT UPPER QUADRANT

[Series 1: us abdomen limited · 0.19mm/px · 14 of 46 slices shown]
[im 1/46]
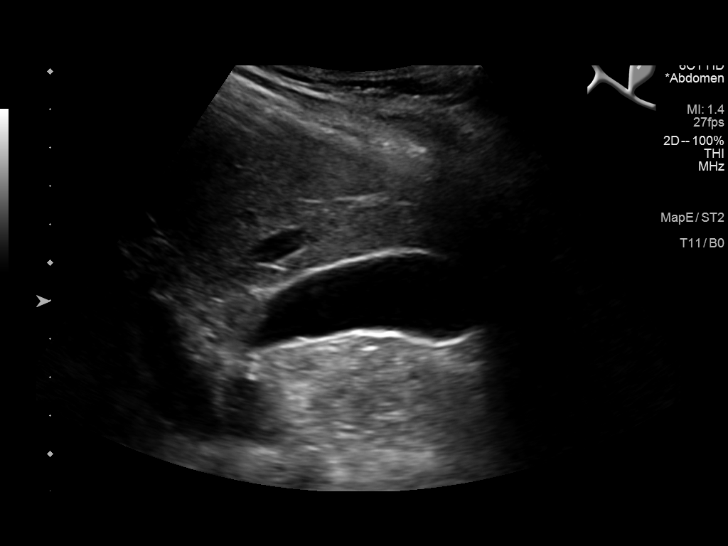
[im 4/46]
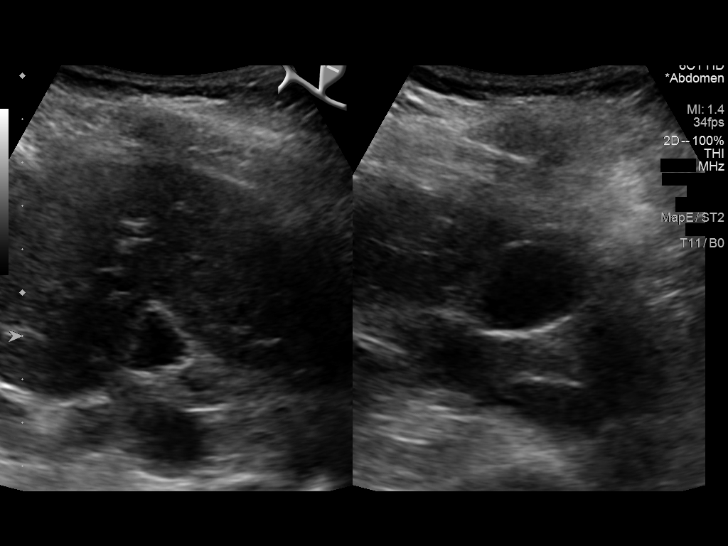
[im 8/46]
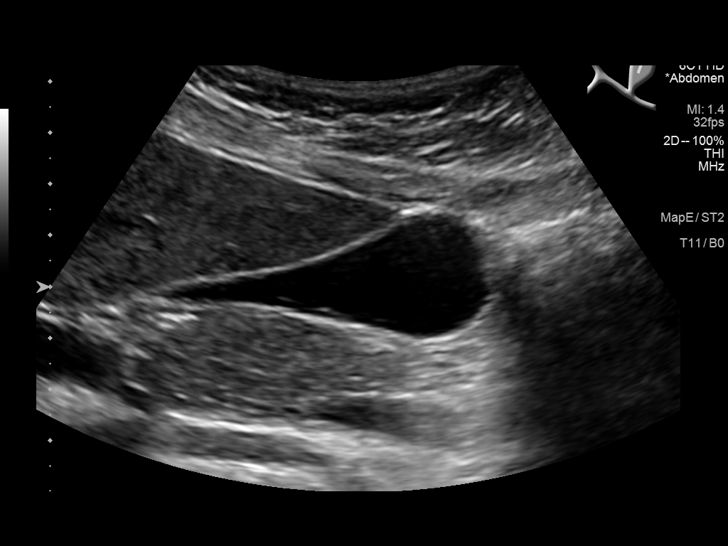
[im 12/46]
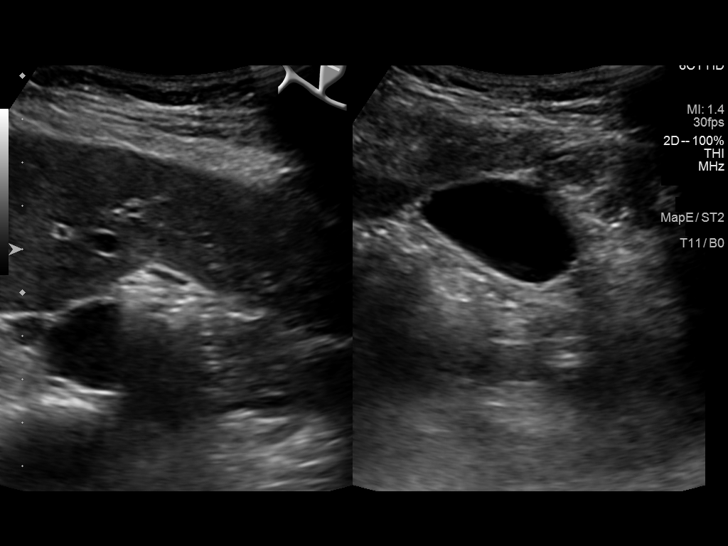
[im 16/46]
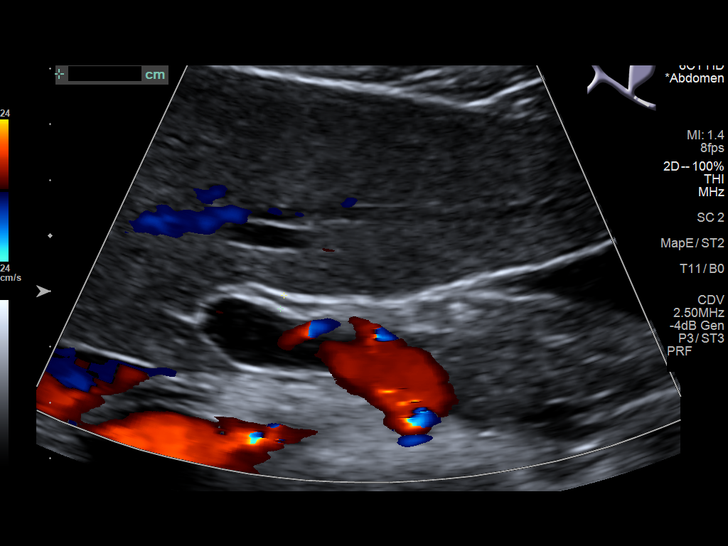
[im 17/46]
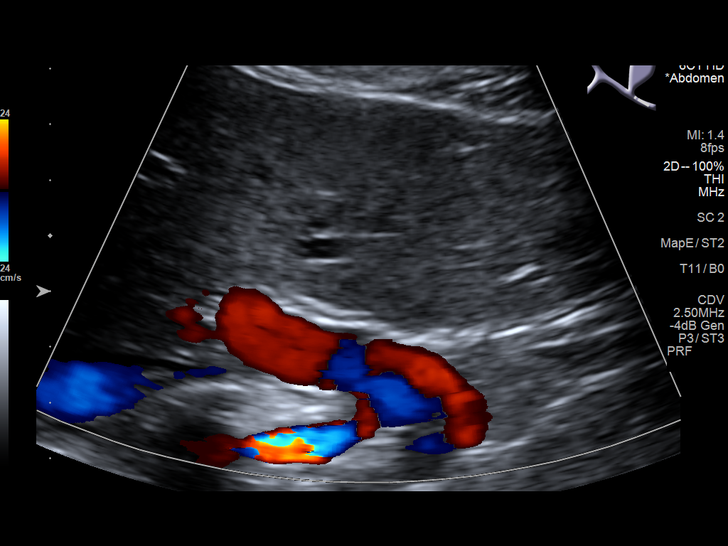
[im 21/46]
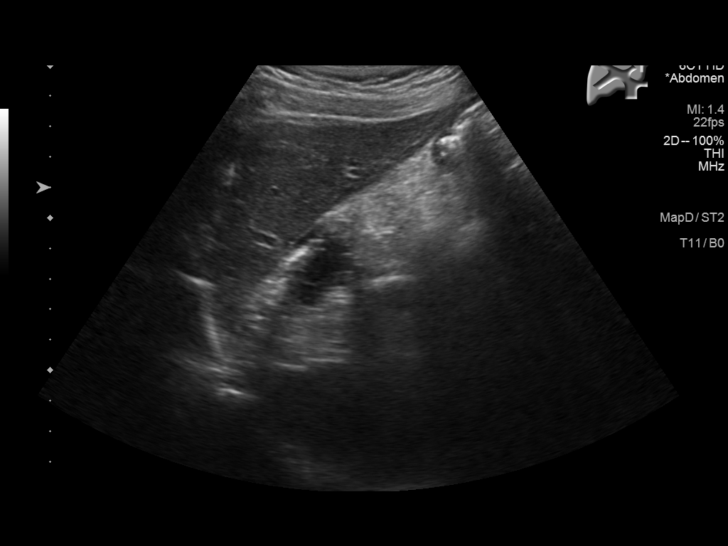
[im 25/46]
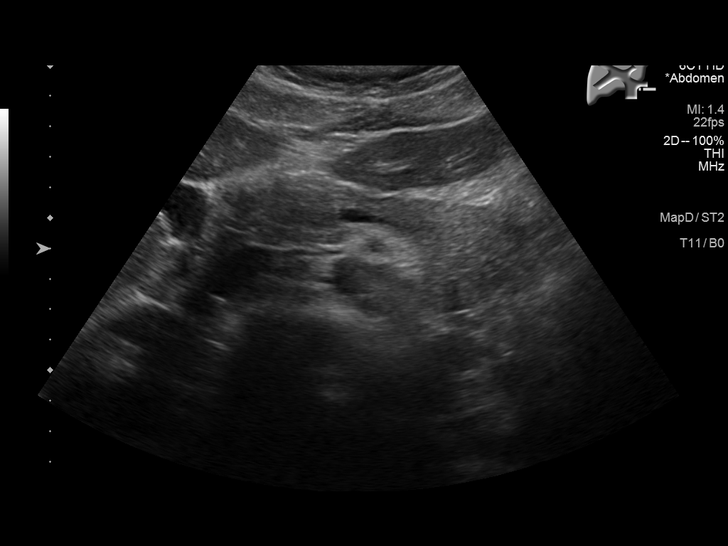
[im 29/46]
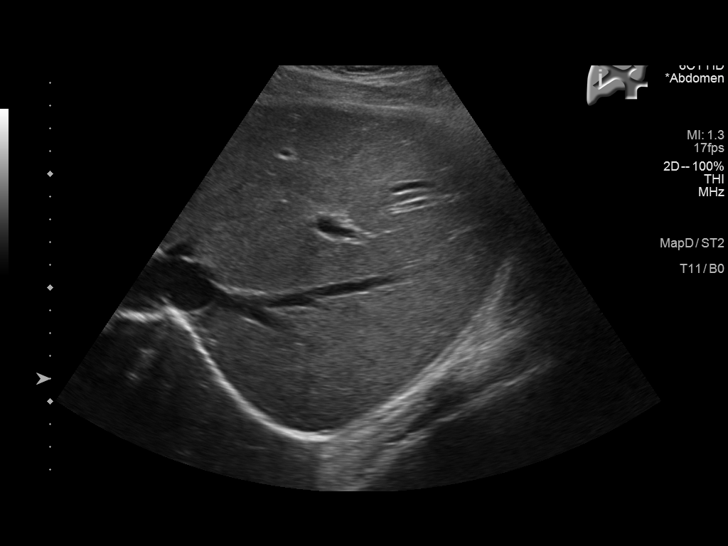
[im 31/46]
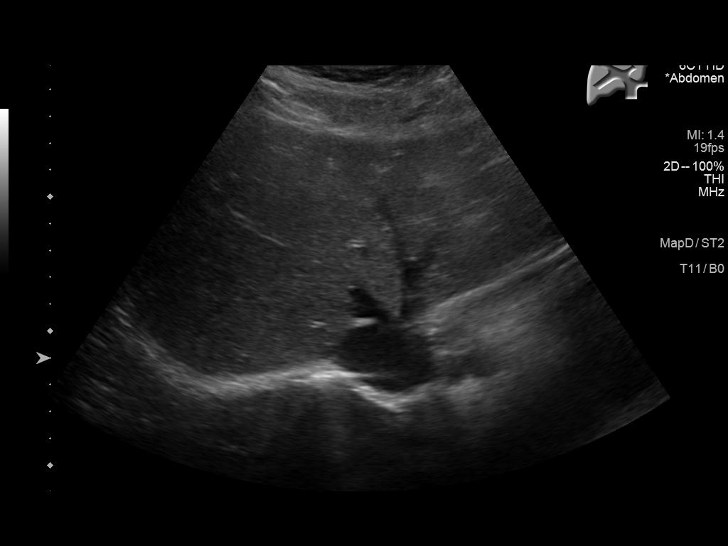
[im 34/46]
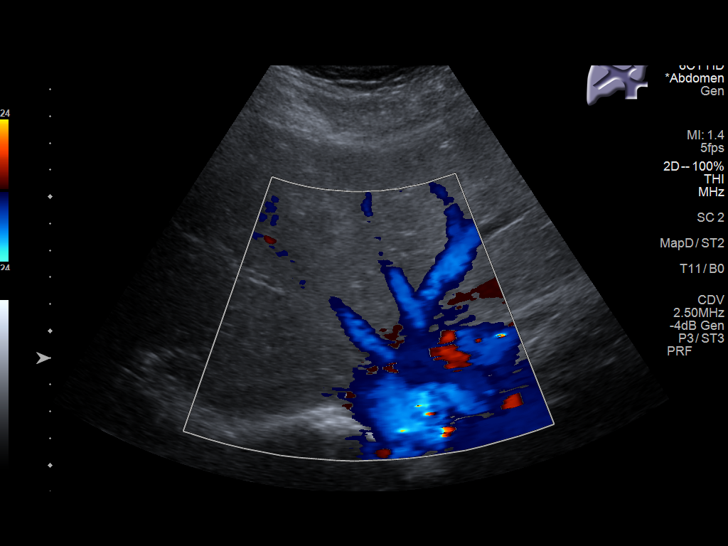
[im 38/46]
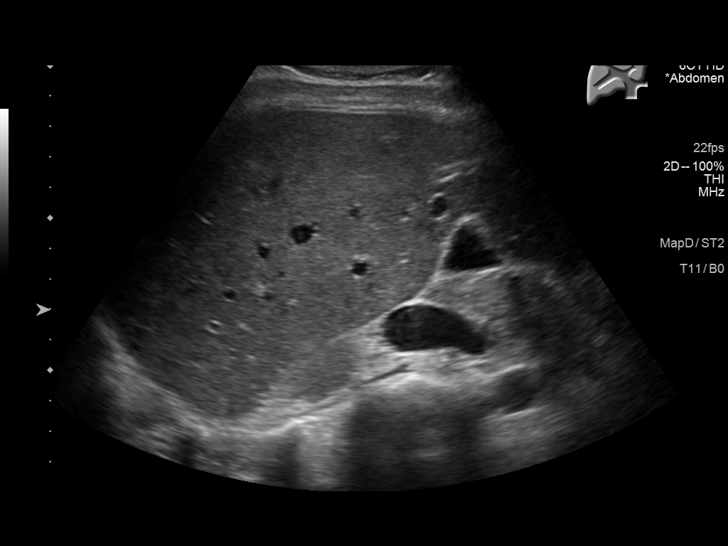
[im 42/46]
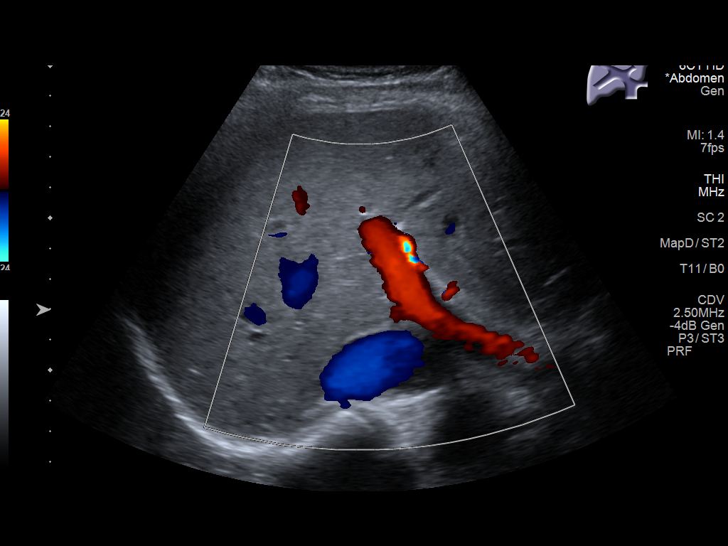
[im 46/46]
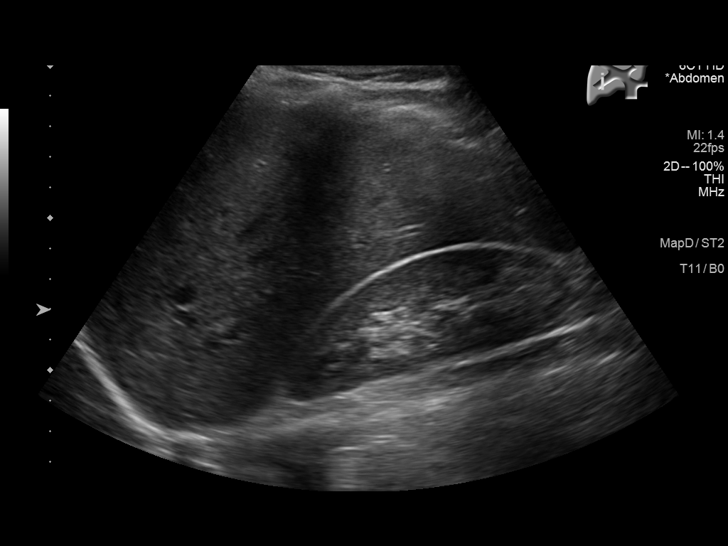

[14 of 25 positions shown; findings below may reference images not displayed]

FINDINGS: Gallbladder:

No gallstones or wall thickening visualized. No sonographic Murphy
sign noted by sonographer.

Common bile duct:

Diameter: 3 mm

Liver:

No focal lesion identified. Within normal limits in parenchymal
echogenicity. Portal vein is patent on color Doppler imaging with
normal direction of blood flow towards the liver.

Other: None.
IMPRESSION: Unremarkable right upper quadrant ultrasound.

## 2021-12-07 ENCOUNTER — Ambulatory Visit
Admission: EM | Admit: 2021-12-07 | Discharge: 2021-12-07 | Disposition: A | Discharge status: Home / Self Care | Payer: Medicaid Other | Attending: Internal Medicine | Admitting: Internal Medicine

## 2021-12-07 DIAGNOSIS — I10 Essential (primary) hypertension: Secondary | ICD-10-CM | POA: Insufficient documentation

## 2021-12-07 LAB — GROUP A STREP BY PCR: Group A Strep by PCR: NOT DETECTED

## 2021-12-07 LAB — MONONUCLEOSIS SCREEN: Mono Screen: NEGATIVE

## 2021-12-07 MED ORDER — AMLODIPINE BESYLATE 2.5 MG PO TABS: 2.5000 mg | ORAL_TABLET | Freq: Every day | ORAL | 1 refills | Status: DC

## 2021-12-07 MED ORDER — TRIAMTERENE-HCTZ 37.5-25 MG PO CAPS: 1.0000 | ORAL_CAPSULE | Freq: Every day | ORAL | 1 refills | Status: DC

## 2021-12-07 NOTE — ED Triage Notes (Signed)
Pt c/o sore left side of throat and left ear pain x3weeks.  ? ? ?Pt is currently breastfeeding.  ?

## 2021-12-07 NOTE — ED Provider Notes (Signed)
?[place] ? ? ? ?CSN: [id number] ?Arrival date & time: 12/07/21  1028 ? ? ?  ? ?History   ?Chief Complaint ?Chief Complaint  ?Patient presents with  ? Sore Throat  ? Ear Pain  ? ? ?HPI ?Tina Hughes is a 31 y.o. female. presents with 3 wk hx sore throat, left ear ache; also has runny/congested nose and am cough.  No fever, no N/V, little bit of diarrhea.  Breast feeding a 2 yo.  Kids at home have sinus infections, have not reported sore throat.   ? ?Also has history of gestational HTN and elevated blood pressure at subsequent care encounters; quite worried about this and would like to start treatment.  Has a little bit of ankle edema chronically and would like any tx to not aggravate ankle edema. ? ? ?Sore Throat ? ? ?Past Medical History:  ?Diagnosis Date  ? Anxiety   ? Vegetarian   ? ? ?Patient Active Problem List  ? Diagnosis Date Noted  ? History of gestational hypertension 10/11/2017  ? Anxiety 03/31/2017  ? ? ?Past Surgical History:  ?Procedure Laterality Date  ? TUBAL LIGATION Bilateral 02/22/2020  ? Procedure: POST PARTUM TUBAL LIGATION;  Surgeon: Rubie Maid, MD;  Location: [facility] ORS;  Service: Gynecology;  Laterality: Bilateral;  ? ? ?OB History   ? ? Gravida  ?3  ? Para  ?3  ? Term  ?3  ? Preterm  ?   ? AB  ?   ? Living  ?3  ?  ? ? SAB  ?   ? IAB  ?   ? Ectopic  ?   ? Multiple  ?0  ? Live Births  ?3  ?   ?  ?  ? ? ? ?Home Medications   ? ?Takes no medications chronically ? ?Family History ?Family History  ?Problem Relation Age of Onset  ? Cancer Paternal Grandmother   ?     skin cancer  ? Irritable bowel syndrome Mother   ? Breast cancer Neg Hx   ? Ovarian cancer Neg Hx   ? Colon cancer Neg Hx   ? ? ?Social History ?Social History  ? ?Tobacco Use  ? Smoking status: Some Days  ?  Types: Cigarettes  ? Smokeless tobacco: Never  ?Vaping Use  ? Vaping Use: Never used  ?Substance Use Topics  ? Alcohol use: Yes  ?  Comment: occasional  ? Drug use: No  ? ? ? ?Allergies   ?Patient has no known  allergies. ? ? ?Review of Systems ?Review of Systems  see HPI ? ? ?Physical Exam ?Triage Vital Signs ?ED Triage Vitals  ?Enc Vitals Group  ?   BP 12/07/21 1220 (!) 153/106  ?   Pulse Rate 12/07/21 1220 73  ?   Resp 12/07/21 1220 18  ?   Temp 12/07/21 1220 98.5 ?F (36.9 ?C)  ?   Temp Source 12/07/21 1220 Oral  ?   SpO2 12/07/21 1220 100 %  ?   Weight 12/07/21 1217 155 lb (70.3 kg)  ?   Height 12/07/21 1217 '5\' 6"'$  (1.676 m)  ?   Pain Score 12/07/21 1217 5  ?   Pain Loc --   ? ? ?Updated Vital Signs ?BP (!) 136/99 (BP Location: Left Arm)   Pulse 83   Temp 98.5 ?F (36.9 ?C) (Oral)   Resp 18   Ht '5\' 6"'$  (1.676 m)   Wt 70.3 kg   LMP 11/15/2021  SpO2 99%   Breastfeeding Yes   BMI 25.02 kg/m?  ? ?Physical Exam ?Constitutional:   ?   General: She is not in acute distress. ?   Appearance: She is not ill-appearing or toxic-appearing.  ?   Comments: Good hygiene  ?HENT:  ?   Head: Atraumatic.  ?   Comments: B TMs dull, slightly injected ?Moderately severe nasal congestion bilaterally ?Throat mod injected ? ?   Mouth/Throat:  ?   Mouth: Mucous membranes are moist.  ?Eyes:  ?   Conjunctiva/sclera:  ?   Right eye: Right conjunctiva is not injected. No exudate. ?   Left eye: Left conjunctiva is not injected. No exudate. ?   Comments: Conjugate gaze observed  ?Cardiovascular:  ?   Rate and Rhythm: Normal rate and regular rhythm.  ?Pulmonary:  ?   Effort: Pulmonary effort is normal. No respiratory distress.  ?   Breath sounds: No wheezing or rhonchi.  ?Abdominal:  ?   General: There is no distension.  ?Musculoskeletal:  ?   Cervical back: Neck supple.  ?   Comments: Walked into the urgent care independently  ?Skin: ?   General: Skin is warm and dry.  ?   Comments: No cyanosis  ?Neurological:  ?   Mental Status: She is alert.  ?   Comments: Face symmetric, speech clear/coherent/logical  ? ? ? ?UC Treatments / Results  ?Labs ?(all labs ordered are listed, but only abnormal results are displayed) ?Labs Reviewed  ?GROUP A STREP  BY PCR  ?MONONUCLEOSIS SCREEN  ?Strep and mono tests are negative ? ?EKG ?NA ? ?Radiology ?No results found. ?NA ? ?Procedures ?Procedures (including critical care time) ?NA ? ?Medications Ordered in UC ?Medications - No data to display ?NA ? ?Final Clinical Impressions(s) / UC Diagnoses  ? ?Final diagnoses:  ?Essential hypertension  ? ?Allergies as of 12/07/2021   ?No Known Allergies ?  ? ?  ?Medication List  ?  ? ?STOP taking these medications   ? ?amoxicillin-clavulanate 875-125 MG tablet ?Commonly known as: AUGMENTIN ?  ?prenatal vitamin w/FE, FA 27-1 MG Tabs tablet ?  ?trimethoprim-polymyxin b ophthalmic solution ?Commonly known as: Polytrim ?  ? ?  ? ?TAKE these medications   ? ?triamterene-hydrochlorothiazide 37.5-25 MG capsule ?Commonly known as: Dyazide ?Take 1 each (1 capsule total) by mouth daily. ?  ? ?  ? ? ? ?Discharge Instructions   ? ?  ?Symptoms and exam are consistent with persistent sinus infection, despite a prior course of amoxicillin/clavulanate.  Tests for strep and for mono were negative today.  Prescription for cefdinir (antibiotic) was sent to the pharmacy.  A nasal steroid spray such as flonase or nasacort used daily may be helpful to manage ongoing sinus/ear congestion.   ?Push fluids and rest.  Take tylenol or advil otc as needed for fever, discomfort.  Eat fruits and vegetables to help your immune system do its best work.  Anticipate gradual improvement over the next several days.  Recheck for new fever >100.5, increasing phlegm production/nasal discharge, or if not starting to improve in a few days.    ?Blood pressure was elevated today, at recheck as well as the initial.  Prescription for triamterene/HCTZ (blood pressure medicine; fluid pill) was sent to the pharmacy.  Please followup with a primary care provider for ongoing assessment and management of blood pressure.   ? ? ? ? ? ? ?PDMP not reviewed this encounter. ?  ?Wynona Luna, MD ?12/08/21 1528 ? ?

## 2021-12-08 ENCOUNTER — Telehealth: Payer: Self-pay | Admitting: Internal Medicine

## 2021-12-08 MED ORDER — CEFDINIR 300 MG PO CAPS: 300.0000 mg | ORAL_CAPSULE | Freq: Two times a day (BID) | ORAL | 0 refills | Status: DC

## 2021-12-08 NOTE — Discharge Instructions (Signed)
Symptoms and exam are consistent with persistent sinus infection, despite a prior course of amoxicillin/clavulanate.  Tests for strep and for mono were negative today.  Prescription for cefdinir (antibiotic) was sent to the pharmacy.  A nasal steroid spray such as flonase or nasacort used daily may be helpful to manage ongoing sinus/ear congestion.   ?Push fluids and rest.  Take tylenol or advil otc as needed for fever, discomfort.  Eat fruits and vegetables to help your immune system do its best work.  Anticipate gradual improvement over the next several days.  Recheck for new fever >100.5, increasing phlegm production/nasal discharge, or if not starting to improve in a few days.    ?Blood pressure was elevated today, at recheck as well as the initial.  Prescription for triamterene/HCTZ (blood pressure medicine; fluid pill) was sent to the pharmacy.  Please followup with a primary care provider for ongoing assessment and management of blood pressure.   ?

## 2021-12-08 NOTE — Telephone Encounter (Signed)
Inadvertently neglected to send this rx at the urgent care visit yesterday. ?

## 2021-12-10 ENCOUNTER — Ambulatory Visit (INDEPENDENT_AMBULATORY_CARE_PROVIDER_SITE_OTHER): Payer: Medicaid Other | Admitting: Family Medicine

## 2021-12-10 ENCOUNTER — Encounter: Payer: Self-pay | Admitting: Family Medicine

## 2021-12-10 VITALS — BP 130/88 | HR 88 | Ht 66.0 in | Wt 151.2 lb

## 2021-12-10 DIAGNOSIS — I10 Essential (primary) hypertension: Secondary | ICD-10-CM | POA: Diagnosis not present

## 2021-12-10 DIAGNOSIS — Z124 Encounter for screening for malignant neoplasm of cervix: Secondary | ICD-10-CM

## 2021-12-10 DIAGNOSIS — F419 Anxiety disorder, unspecified: Secondary | ICD-10-CM | POA: Diagnosis not present

## 2021-12-10 NOTE — Assessment & Plan Note (Signed)
Patient with longstanding anxiety, no treatments to date other than exercise based sporadically.  She does note improvement with exercise treatment.  Concern for her anxiety playing a role in the significantly elevated blood pressure readings in the past.  At this stage have advised her on pharmacologic and nonpharmacologic treatment strategies, we will focus on nonpharmacologic management.  I have provided patient information for her to review, we will reevaluate symptoms at her return for annual physical. ?

## 2021-12-10 NOTE — Patient Instructions (Addendum)
-   Continue current blood pressure medication ?- Touch base with your gynecologist regarding lactation/nursing safety while on this medication (Triamterene-hydrochlorothiazide 37.5-25 mg once daily) Additionally ask them about possible increase to 50 mg dose. ?-Recommend holding from nursing until hearing back from gynecology ?- Obtain fasting labs with orders provided ?- Return for follow-up in 1 month for annual physical ?- Contact us for any questions concerns between now and then ?

## 2021-12-10 NOTE — Assessment & Plan Note (Signed)
Serial blood pressure readings demonstrate significant elevation, this is a chronic issue with ongoing lack of adequate control.  Her blood pressure has not decreased over interval visit showing positive response to triamterene-hydrochlorothiazide 37.5-25 mg daily.  That being said, she is currently nursing and have advised her to hold from nursing until she is able to contact her OB/GYN.  She is to continue with her current BP medication. ? ?From a cardiopulmonary standpoint, her examination reveals prominent S1 and positive S2, regular rate and rhythm, no additional heart sounds, no JVD, no carotid bruits, clear lung fields throughout, symmetric pulses, trace pitting edema in bilateral ankles, lung fields are clear throughout.  Abdomen is soft, nontender, nondistended, no abdominal bruits noted, no hepatosplenomegaly appreciated. ? ?Plan for multiple risk stratification labs, will defer from further titration of her medication until we get OB/GYN input, and patient oriented information was provided.  She will return for follow-up in 4 weeks for annual physical. ?

## 2021-12-10 NOTE — Progress Notes (Signed)
?  ? ?  Primary Care / Sports Medicine Office Visit ? ?Patient Information:  ?Patient ID: Tina Hughes, female DOB: [birth year] Age: 31 y.o. MRN: [id number]  ? ?Tina Hughes is a pleasant 31 y.o. female presenting with the following: ? ?Chief Complaint  ?Patient presents with  ? Establish Care  ? Hypertension  ?  Was seen in UC and was given HCTZ. Pt states that her legs aren't as swollen since being on the medication.   ? ? ?Vitals:  ? 12/10/21 1501  ?BP: 130/88  ?Pulse: 88  ?SpO2: 98%  ? ?Vitals:  ? 12/10/21 1501  ?Weight: 151 lb 3.2 oz (68.6 kg)  ?Height: '5\' 6"'$  (1.676 m)  ? ?Body mass index is 24.4 kg/m?. ? ?No results found.  ? ?Independent interpretation of notes and tests performed by another provider:  ? ?None ? ?Procedures performed:  ? ?None ? ?Pertinent History, Exam, Impression, and Recommendations:  ? ?Problem List Items Addressed This Visit   ? ?  ? Cardiovascular and Mediastinum  ? Hypertension - Primary  ?  Serial blood pressure readings demonstrate significant elevation, this is a chronic issue with ongoing lack of adequate control.  Her blood pressure has not decreased over interval visit showing positive response to triamterene-hydrochlorothiazide 37.5-25 mg daily.  That being said, she is currently nursing and have advised her to hold from nursing until she is able to contact her OB/GYN.  She is to continue with her current BP medication. ? ?From a cardiopulmonary standpoint, her examination reveals prominent S1 and positive S2, regular rate and rhythm, no additional heart sounds, no JVD, no carotid bruits, clear lung fields throughout, symmetric pulses, trace pitting edema in bilateral ankles, lung fields are clear throughout.  Abdomen is soft, nontender, nondistended, no abdominal bruits noted, no hepatosplenomegaly appreciated. ? ?Plan for multiple risk stratification labs, will defer from further titration of her medication until we get OB/GYN input, and patient oriented information was  provided.  She will return for follow-up in 4 weeks for annual physical. ? ?  ?  ? Relevant Orders  ? Apo A1 + B + Ratio  ? CBC  ? Comprehensive metabolic panel  ? Lipid panel  ? TSH  ?  ? Other  ? Anxiety  ?  Patient with longstanding anxiety, no treatments to date other than exercise based sporadically.  She does note improvement with exercise treatment.  Concern for her anxiety playing a role in the significantly elevated blood pressure readings in the past.  At this stage have advised her on pharmacologic and nonpharmacologic treatment strategies, we will focus on nonpharmacologic management.  I have provided patient information for her to review, we will reevaluate symptoms at her return for annual physical. ? ?  ?  ? ?Other Visit Diagnoses   ? ? Cervical cancer screening      ? Relevant Orders  ? Ambulatory referral to Gynecology  ? ?  ?  ? ?Orders & Medications ?No orders of the defined types were placed in this encounter. ? ?Orders Placed This Encounter  ?Procedures  ? Apo A1 + B + Ratio  ? CBC  ? Comprehensive metabolic panel  ? Lipid panel  ? TSH  ? Ambulatory referral to Gynecology  ?  ? ?Return in about 4 weeks (around 01/07/2022) for Annual Physical.  ?  ? ?Montel Culver, MD ? ? Primary Care Sports Medicine ?[facility] ?[place]  ? ?

## 2021-12-13 DIAGNOSIS — I10 Essential (primary) hypertension: Secondary | ICD-10-CM | POA: Diagnosis not present

## 2021-12-14 ENCOUNTER — Telehealth: Payer: Self-pay | Admitting: Certified Nurse Midwife

## 2021-12-14 LAB — COMPREHENSIVE METABOLIC PANEL
ALT: 17 IU/L (ref 0–32)
AST: 22 IU/L (ref 0–40)
Albumin/Globulin Ratio: 1.8 (ref 1.2–2.2)
Albumin: 4.8 g/dL (ref 3.8–4.8)
Alkaline Phosphatase: 78 IU/L (ref 44–121)
BUN/Creatinine Ratio: 13 (ref 9–23)
BUN: 10 mg/dL (ref 6–20)
Bilirubin Total: 0.4 mg/dL (ref 0.0–1.2)
CO2: 24 mmol/L (ref 20–29)
Calcium: 9.3 mg/dL (ref 8.7–10.2)
Chloride: 99 mmol/L (ref 96–106)
Creatinine, Ser: 0.76 mg/dL (ref 0.57–1.00)
Globulin, Total: 2.6 g/dL (ref 1.5–4.5)
Glucose: 84 mg/dL (ref 70–99)
Potassium: 3.9 mmol/L (ref 3.5–5.2)
Sodium: 138 mmol/L (ref 134–144)
Total Protein: 7.4 g/dL (ref 6.0–8.5)
eGFR: 107 mL/min/{1.73_m2} (ref >59)

## 2021-12-14 LAB — CBC
Hematocrit: 40.1 % (ref 34.0–46.6)
Hemoglobin: 13.4 g/dL (ref 11.1–15.9)
MCH: 28.6 pg (ref 26.6–33.0)
MCHC: 33.4 g/dL (ref 31.5–35.7)
MCV: 86 fL (ref 79–97)
Platelets: 251 10*3/uL (ref 150–450)
RBC: 4.69 x10E6/uL (ref 3.77–5.28)
RDW: 13.9 % (ref 11.7–15.4)
WBC: 6.1 10*3/uL (ref 3.4–10.8)

## 2021-12-14 LAB — LIPID PANEL
Chol/HDL Ratio: 2.4 ratio (ref 0.0–4.4)
Cholesterol, Total: 192 mg/dL (ref 100–199)
HDL: 80 mg/dL (ref >39)
LDL Chol Calc (NIH): 94 mg/dL (ref 0–99)
Triglycerides: 103 mg/dL (ref 0–149)
VLDL Cholesterol Cal: 18 mg/dL (ref 5–40)

## 2021-12-14 LAB — TSH: TSH: 0.88 u[IU]/mL (ref 0.450–4.500)

## 2021-12-14 LAB — APO A1 + B + RATIO
Apolipo. B/A-1 Ratio: 0.5 ratio (ref 0.0–0.6)
Apolipoprotein A-1: 182 mg/dL (ref 116–209)
Apolipoprotein B: 87 mg/dL (ref <90)

## 2021-12-14 NOTE — Telephone Encounter (Signed)
Returned PT call per [facility] "informed PT to keep her upcoming NEW GYN appt with [facility] to discuss BP RX increase and concerns". Encouraged PT to inform her PCP of the date and time of appointment  and to call [facility] and see if possible she could get a sooner appointment.  ?

## 2021-12-14 NOTE — Telephone Encounter (Signed)
PCP  would like to increase BP meds. Pt is still breastfeeding and would like to know if this is safe. Please return call. ?

## 2021-12-27 ENCOUNTER — Encounter: Payer: Self-pay | Admitting: Obstetrics

## 2021-12-29 ENCOUNTER — Encounter: Payer: Self-pay | Admitting: Family Medicine

## 2021-12-29 ENCOUNTER — Encounter: Payer: Self-pay | Admitting: Obstetrics

## 2021-12-29 NOTE — Telephone Encounter (Signed)
Please review.  KP

## 2022-01-10 ENCOUNTER — Encounter: Payer: Self-pay | Admitting: Family Medicine

## 2022-01-10 ENCOUNTER — Ambulatory Visit (INDEPENDENT_AMBULATORY_CARE_PROVIDER_SITE_OTHER): Payer: Medicaid Other | Admitting: Family Medicine

## 2022-01-10 VITALS — BP 138/88 | HR 88 | Ht 66.0 in | Wt 151.6 lb

## 2022-01-10 DIAGNOSIS — I10 Essential (primary) hypertension: Secondary | ICD-10-CM

## 2022-01-10 DIAGNOSIS — Z0000 Encounter for general adult medical examination without abnormal findings: Secondary | ICD-10-CM | POA: Diagnosis not present

## 2022-01-10 DIAGNOSIS — F419 Anxiety disorder, unspecified: Secondary | ICD-10-CM | POA: Diagnosis not present

## 2022-01-10 MED ORDER — LISINOPRIL 10 MG PO TABS: 10.0000 mg | ORAL_TABLET | Freq: Every day | ORAL | 3 refills | Status: DC

## 2022-01-10 NOTE — Assessment & Plan Note (Signed)
Well controlled, not disrupting daily life, non-pharmacalogic management encouraged.

## 2022-01-10 NOTE — Patient Instructions (Signed)
-   Start lisinopril daily (stop all medication) - Review information provided - Attend eye doctor annually, dentist every 6 months, work towards or maintain 30 minutes of moderate intensity physical activity at least 5 days per week, and consume a balanced diet - Return in 1 year for physical - Contact us for any questions between now and then

## 2022-01-10 NOTE — Assessment & Plan Note (Signed)
Interval improvement though still at high normal. As she has tubal ligation and has discontinued nursing, will switch to lisinopril 10 mg daily and have her return in 1 month for reevaluation. Additionally, home sleep study ordered.

## 2022-01-10 NOTE — Assessment & Plan Note (Signed)
Annual examination completed, risk stratification labs reviewed, anticipatory guidance provided.

## 2022-01-10 NOTE — Progress Notes (Signed)
Annual Physical Exam Visit  Patient Information:  Patient ID: Tina Hughes, female DOB: [birth year] Age: 31 y.o. MRN: [id number]   Subjective:   CC: Annual Physical Exam  HPI:  Tina Hughes is here for their annual physical.  I reviewed the past medical history, family history, social history, surgical history, and allergies today and changes were made as necessary.  Please see the problem list section below for additional details.  Past Medical History: Past Medical History:  Diagnosis Date   Anxiety    Past Surgical History: Past Surgical History:  Procedure Laterality Date   TUBAL LIGATION Bilateral 02/22/2020   Procedure: POST PARTUM TUBAL LIGATION;  Surgeon: Rubie Maid, MD;  Location: [facility] ORS;  Service: Gynecology;  Laterality: Bilateral;   Family History: Family History  Problem Relation Age of Onset   Irritable bowel syndrome Mother    Hypertension Sister    Cancer Paternal Grandmother        skin cancer   Breast cancer Neg Hx    Ovarian cancer Neg Hx    Colon cancer Neg Hx    Allergies: No Known Allergies Health Maintenance: Health Maintenance  Topic Date Due   Hepatitis C Screening  Never done   PAP SMEAR-Modifier  05/25/2021   INFLUENZA VACCINE  03/22/2022   TETANUS/TDAP  12/02/2029   HIV Screening  Completed   HPV VACCINES  Aged Out   COVID-19 Vaccine  Discontinued    HM Colonoscopy     This patient has no relevant Health Maintenance data.      Medications: No current outpatient medications on file prior to visit.   No current facility-administered medications on file prior to visit.    Review of Systems: No headache, visual changes, nausea, vomiting, diarrhea, constipation, dizziness, abdominal pain, skin rash, fevers, chills, night sweats, swollen lymph nodes, weight loss, chest pain, body aches, joint swelling, muscle aches, shortness of breath, mood changes, visual or auditory hallucinations reported.  Objective:   Vitals:    01/10/22 0937  BP: 138/88  Pulse: 88  SpO2: 98%   Vitals:   01/10/22 0937  Weight: 151 lb 9.6 oz (68.8 kg)  Height: '5\' 6"'$  (1.676 m)   Body mass index is 24.47 kg/m.  General: Well Developed, well nourished, and in no acute distress.  Neuro: Alert and oriented x3, extra-ocular muscles intact, sensation grossly intact. Cranial nerves II through XII are grossly intact, motor, sensory, and coordinative functions are intact. HEENT: Normocephalic, atraumatic, pupils equal round reactive to light, neck supple, no masses, no lymphadenopathy, thyroid nonpalpable. Oropharynx, nasopharynx, external ear canals are unremarkable. Skin: Warm and dry, no rashes noted.  Cardiac: Regular rate and rhythm, no murmurs rubs or gallops. No peripheral edema. Pulses symmetric. Respiratory: Clear to auscultation bilaterally. Not using accessory muscles, speaking in full sentences.  Abdominal: Soft, nontender, nondistended, positive bowel sounds, no masses, no organomegaly. Musculoskeletal: Shoulder, elbow, wrist, hip, knee, ankle stable, and with full range of motion.  Female chaperone initials: KP present throughout the physical examination.  Impression and Recommendations:   The patient was counselled, risk factors were discussed, and anticipatory guidance given.  Problem List Items Addressed This Visit       Cardiovascular and Mediastinum   Hypertension    Interval improvement though still at high normal. As she has tubal ligation and has discontinued nursing, will switch to lisinopril 10 mg daily and have her return in 1 month for reevaluation. Additionally, home sleep study ordered.  Relevant Medications   lisinopril (ZESTRIL) 10 MG tablet     Other   Anxiety    Well controlled, not disrupting daily life, non-pharmacalogic management encouraged.       Annual physical exam - Primary    Annual examination completed, risk stratification labs reviewed, anticipatory guidance provided.           Orders & Medications Medications:  Meds ordered this encounter  Medications   lisinopril (ZESTRIL) 10 MG tablet    Sig: Take 1 tablet (10 mg total) by mouth daily.    Dispense:  30 tablet    Refill:  3   No orders of the defined types were placed in this encounter.    Return in about 4 weeks (around 02/07/2022).    Montel Culver, MD   Primary Care Sports Medicine [place]

## 2022-01-12 ENCOUNTER — Other Ambulatory Visit: Payer: Self-pay

## 2022-01-12 DIAGNOSIS — Z1283 Encounter for screening for malignant neoplasm of skin: Secondary | ICD-10-CM

## 2022-01-12 NOTE — Telephone Encounter (Signed)
Referral placed.  KP 

## 2022-01-25 ENCOUNTER — Encounter: Payer: Medicaid Other | Admitting: Obstetrics

## 2022-01-31 ENCOUNTER — Ambulatory Visit: Payer: Medicaid Other | Admitting: Family Medicine

## 2022-02-03 ENCOUNTER — Encounter: Payer: Self-pay | Admitting: Obstetrics

## 2022-02-03 ENCOUNTER — Other Ambulatory Visit (HOSPITAL_COMMUNITY)
Admission: RE | Admit: 2022-02-03 | Discharge: 2022-02-03 | Disposition: A | Discharge status: Home / Self Care | Payer: Medicaid Other | Source: Ambulatory Visit | Attending: Obstetrics | Admitting: Obstetrics

## 2022-02-03 ENCOUNTER — Ambulatory Visit (INDEPENDENT_AMBULATORY_CARE_PROVIDER_SITE_OTHER): Payer: Medicaid Other | Admitting: Obstetrics

## 2022-02-03 VITALS — BP 128/70 | Ht 66.0 in | Wt 153.0 lb

## 2022-02-03 DIAGNOSIS — Z124 Encounter for screening for malignant neoplasm of cervix: Secondary | ICD-10-CM | POA: Diagnosis not present

## 2022-02-03 DIAGNOSIS — Z01411 Encounter for gynecological examination (general) (routine) with abnormal findings: Secondary | ICD-10-CM

## 2022-02-03 DIAGNOSIS — N920 Excessive and frequent menstruation with regular cycle: Secondary | ICD-10-CM | POA: Diagnosis not present

## 2022-02-03 NOTE — Patient Instructions (Signed)
Have a great year! Please call with any concerns. Don't forget to wear your seatbelt everyday! If you are not signed up on MyChart, please ask [place] how to sign up for it!   In a world where you can be anything, please be kind.   Body mass index is 24.69 kg/m.  A Healthy Lifestyle: Care Instructions Your Care Instructions  A healthy lifestyle can help you feel good, stay at a healthy weight, and have plenty of energy for both work and play. A healthy lifestyle is something you can share with your whole family. A healthy lifestyle also can lower your risk for serious health problems, such as high blood pressure, heart disease, and diabetes. You can follow a few steps listed below to improve your health and the health of your family. Follow-up care is a key part of your treatment and safety. Be sure to make and go to all appointments, and call your doctor if you are having problems. It's also a good idea to know your test results and keep a list of the medicines you take. How can you care for yourself at home? Do not eat too much sugar, fat, or fast foods. You can still have dessert and treats now and then. The goal is moderation. Start small to improve your eating habits. Pay attention to portion sizes, drink less juice and soda pop, and eat more fruits and vegetables. Eat a healthy amount of food. A 3-ounce serving of meat, for example, is about the size of a deck of cards. Fill the rest of your plate with vegetables and whole grains. Limit the amount of soda and sports drinks you have every day. Drink more water when you are thirsty. Eat at least 5 servings of fruits and vegetables every day. It may seem like a lot, but it is not hard to reach this goal. A serving or helping is 1 piece of fruit, 1 cup of vegetables, or 2 cups of leafy, raw vegetables. Have an apple or some carrot sticks as an afternoon snack instead of a candy bar. Try to have fruits and/or vegetables at every meal. Make exercise  part of your daily routine. You may want to start with simple activities, such as walking, bicycling, or slow swimming. Try to be active 30 to 60 minutes every day. You do not need to do all 30 to 60 minutes all at once. For example, you can exercise 3 times a day for 10 or 20 minutes. Moderate exercise is safe for most people, but it is always a good idea to talk to your doctor before starting an exercise program. Keep moving. Mow the lawn, work in the garden, or [employer]. Take the stairs instead of the elevator at work. If you smoke, quit. People who smoke have an increased risk for heart attack, stroke, cancer, and other lung illnesses. Quitting is hard, but there are ways to boost your chance of quitting tobacco for good. Use nicotine gum, patches, or lozenges. Ask your doctor about stop-smoking programs and medicines. Keep trying. In addition to reducing your risk of diseases in the future, you will notice some benefits soon after you stop using tobacco. If you have shortness of breath or asthma symptoms, they will likely get better within a few weeks after you quit. Limit how much alcohol you drink. Moderate amounts of alcohol (up to 2 drinks a day for men, 1 drink a day for women) are okay. But drinking too much can lead to  liver problems, high blood pressure, and other health problems. Family health If you have a family, there are many things you can do together to improve your health. Eat meals together as a family as often as possible. Eat healthy foods. This includes fruits, vegetables, lean meats and dairy, and whole grains. Include your family in your fitness plan. Most people think of activities such as jogging or tennis as the way to fitness, but there are many ways you and your family can be more active. Anything that makes you breathe hard and gets your heart pumping is exercise. Here are some tips: Walk to do errands or to take your child to school or the bus. Go for a family  bike ride after dinner instead of watching TV. Care instructions adapted under license by your healthcare professional. This care instruction is for use with your licensed healthcare professional. If you have questions about a medical condition or this instruction, always ask your healthcare professional. [place] any warranty or liability for your use of this information.

## 2022-02-03 NOTE — Progress Notes (Signed)
Chief Complaint  Patient presents with   Gynecologic Exam   Patient Tina Hughes is an 31 y.o. year old [id number] Patient's last menstrual period was 01/26/2022 (approximate). currently BTL for contraception who presents for annual. Patient is exercising regularly. Patient does perform self breast exam. Patient denies family history of genetic cancer. Patient takes a multivitamin. Patient is content with the birth control method. Patient is sexually active with no problems.  Patients pronouns are decline to answer   Primary care provider: Diona Fanti, CNM  Review of Systems  Constitutional:  Negative for activity change, appetite change, chills, diaphoresis, fatigue, fever and unexpected weight change.  HENT:  Negative for congestion, dental problem, drooling, ear discharge, ear pain, facial swelling, hearing loss, mouth sores, nosebleeds, postnasal drip, rhinorrhea, sinus pressure, sinus pain, sneezing, sore throat, tinnitus, trouble swallowing and voice change.   Eyes:  Negative for photophobia, pain, discharge, redness, itching and visual disturbance.  Respiratory:  Negative for apnea, cough, choking, chest tightness, shortness of breath, wheezing and stridor.   Cardiovascular:  Negative for chest pain, palpitations and leg swelling.  Gastrointestinal:  Negative for abdominal distention, abdominal pain, anal bleeding, blood in stool, constipation, diarrhea, nausea, rectal pain and vomiting.  Endocrine: Negative for cold intolerance, heat intolerance, polydipsia, polyphagia and polyuria.  Genitourinary:  Negative for decreased urine volume, difficulty urinating, dyspareunia, dysuria, enuresis, flank pain, frequency, genital sores, hematuria, menstrual problem, pelvic pain, urgency, vaginal bleeding, vaginal discharge and vaginal pain.  Musculoskeletal:  Negative for arthralgias, back pain, gait problem, joint swelling, myalgias, neck pain and neck stiffness.  Skin:  Negative for  color change, pallor, rash and wound.  Allergic/Immunologic: Negative for environmental allergies, food allergies and immunocompromised state.  Neurological:  Negative for dizziness, tremors, seizures, syncope, facial asymmetry, speech difficulty, weakness, light-headedness, numbness and headaches.  Hematological:  Negative for adenopathy. Does not bruise/bleed easily.  Psychiatric/Behavioral:  Negative for agitation, behavioral problems, confusion, decreased concentration, dysphoric mood, hallucinations, self-injury, sleep disturbance and suicidal ideas. The patient is not nervous/anxious and is not hyperactive.      Menstrual history: Menarche: 12 Period Cycle (Days): 28 Period Duration (Days): 5 Period Pattern: Regular Menstrual Flow: Heavy Menstrual Control: Thin pad, Tampon Menstrual Control Change Freq (Hours): 1 Dysmenorrhea: (!) Moderate Dysmenorrhea Symptoms: Cramping Does have occ soiling episodes,   Past Medical History:  Diagnosis Date   Anxiety    Past Surgical History:  Procedure Laterality Date   TUBAL LIGATION Bilateral 02/22/2020   Procedure: POST PARTUM TUBAL LIGATION;  Surgeon: Rubie Maid, MD;  Location: [facility] ORS;  Service: Gynecology;  Laterality: Bilateral;   Family History  Problem Relation Age of Onset   Irritable bowel syndrome Mother    Hypertension Sister    Cancer Paternal Grandmother        skin cancer   Breast cancer Neg Hx    Ovarian cancer Neg Hx    Colon cancer Neg Hx    Social History   Socioeconomic History   Marital status: Single    Spouse name: Not on file   Number of children: Not on file   Years of education: Not on file   Highest education level: Not on file  Occupational History   Not on file  Tobacco Use   Smoking status: Former    Packs/day: 0.10    Types: Cigarettes    Quit date: 10/2021    Years since quitting: 0.2   Smokeless tobacco: Never  Vaping Use   Vaping Use: Never used  Substance and Sexual Activity    Alcohol use: Yes    Comment: occasional   Drug use: No   Sexual activity: Yes    Birth control/protection: None  Other Topics Concern   Not on file  Social History Narrative   Not on file   Social Determinants of Health   Financial Resource Strain: Not on file  Food Insecurity: Not on file  Transportation Needs: Not on file  Physical Activity: Not on file  Stress: Not on file  Social Connections: Not on file  Intimate Partner Violence: Not on file   Patient denies abnormal paps. Patient denies pelvic infections. Patient denies domestic violence or sexual abuse Patient has not received Gardisil series Health Maintenance  Topic Date Due   Hepatitis C Screening  Never done   PAP SMEAR-Modifier  05/25/2021   INFLUENZA VACCINE  03/22/2022   TETANUS/TDAP  12/02/2029   HIV Screening  Completed   HPV VACCINES  Aged Out   COVID-19 Vaccine  Discontinued    Medicine list and allergies reviewed and updated.     Objective:  BP 128/70   Ht '5\' 6"'$  (1.676 m)   Wt 153 lb (69.4 kg)   LMP 01/26/2022 (Approximate)   BMI 24.69 kg/m     01/10/2022    9:43 AM 12/10/2021    3:07 PM 03/23/2020   10:48 AM 03/06/2020    1:52 PM 02/25/2020   11:37 AM  Depression screen PHQ 2/9  Decreased Interest 0 0 0 0 1  Down, Depressed, Hopeless 0 0 0 0 1  PHQ - 2 Score 0 0 0 0 2  Altered sleeping 0 1 0 0 1  Tired, decreased energy 1 0 0 1 1  Change in appetite 1 0 0 1 2  Feeling bad or failure about yourself  0 0 0 0 1  Trouble concentrating 0 0 0 1 2  Moving slowly or fidgety/restless 0 0 0 0 1  Suicidal thoughts 0 0 0 0 0  PHQ-9 Score 2 1 0 3 10  Difficult doing work/chores Not difficult at all Not difficult at all Not difficult at all Not difficult at all Somewhat difficult      01/10/2022    9:43 AM 12/10/2021    3:07 PM 03/23/2020   10:49 AM  GAD 7 : Generalized Anxiety Score  Nervous, Anxious, on Edge 0 0 0  Control/stop worrying 0 0 0  Worry too much - different things 0 0 0  Trouble  relaxing 0 1 0  Restless 0 0 0  Easily annoyed or irritable 0 0 0  Afraid - awful might happen 0 0 0  Total GAD 7 Score 0 1 0  Anxiety Difficulty Not difficult at all  Not difficult at all    Physical Exam Vitals and nursing note reviewed. Exam conducted with a chaperone present.  Constitutional:      General: She is not in acute distress.    Appearance: Normal appearance. She is not ill-appearing.  HENT:     Head: Normocephalic and atraumatic.     Mouth/Throat:     Mouth: Mucous membranes are moist.     Pharynx: No oropharyngeal exudate.  Eyes:     Extraocular Movements: Extraocular movements intact.  Neck:     Thyroid: No thyromegaly.  Cardiovascular:     Rate and Rhythm: Normal rate and regular rhythm.     Pulses: Normal pulses.     Heart sounds: Normal heart sounds.  Pulmonary:  Effort: Pulmonary effort is normal. No respiratory distress.     Breath sounds: Normal breath sounds.  Chest:     Chest wall: No mass or tenderness.  Breasts:    Breasts are symmetrical.     Right: Normal. No mass, nipple discharge, skin change or tenderness.     Left: Normal. No mass, nipple discharge, skin change or tenderness.  Abdominal:     General: There is no distension.     Palpations: There is no mass.     Tenderness: There is no abdominal tenderness. There is no rebound.     Hernia: No hernia is present.  Genitourinary:    General: Normal vulva.     Exam position: Lithotomy position.     Pubic Area: No rash.      Labia:        Right: No rash, tenderness or lesion.        Left: No rash, tenderness or lesion.      Urethra: No urethral lesion.     Vagina: Normal. No foreign body. No vaginal discharge, tenderness or lesions.     Cervix: No discharge, friability, lesion, erythema or cervical bleeding.     Uterus: Normal. Not enlarged, not tender and no uterine prolapse.      Adnexa: Right adnexa normal and left adnexa normal.       Right: No tenderness or fullness.          Left: No tenderness or fullness.    Musculoskeletal:        General: No tenderness. Normal range of motion.     Cervical back: Normal range of motion. No tenderness.     Right lower leg: No edema.     Left lower leg: No edema.  Lymphadenopathy:     Cervical: No cervical adenopathy.     Upper Body:     Right upper body: No axillary adenopathy.     Left upper body: No axillary adenopathy.     Lower Body: No right inguinal adenopathy. No left inguinal adenopathy.  Skin:    General: Skin is warm and dry.  Neurological:     General: No focal deficit present.     Mental Status: She is alert and oriented to person, place, and time. Mental status is at baseline.     Coordination: Coordination normal.     Gait: Gait normal.     Deep Tendon Reflexes: Reflexes normal.  Psychiatric:        Mood and Affect: Mood normal.        Behavior: Behavior normal.        Thought Content: Thought content normal.        Judgment: Judgment normal.      Assessment/Plan:   Encounter for gynecological examination with abnormal finding  Screening for malignant neoplasm of cervix - Plan: Cytology - PAP  Menorrhagia with regular cycle  Follow up Pap HPV Monthly self breast exam. Daily multivitamin recommended. Contraception: BTL Gardisil discussed declined Wellness labs Per PCP  Exercise discussed with patient.  Discussed use of ibuprofen 1 day and 2 days into cycle for decreasing flow and cramps. Recommend pepcid or taking with food dfor stomach lining protection  Return in about 1 year (around 02/04/2023), or if symptoms worsen or fail to improve, for annual/well woman.

## 2022-02-04 LAB — CYTOLOGY - PAP
Comment: NEGATIVE
Diagnosis: NEGATIVE
High risk HPV: NEGATIVE

## 2022-02-14 ENCOUNTER — Ambulatory Visit: Payer: Medicaid Other | Admitting: Family Medicine

## 2022-02-28 ENCOUNTER — Ambulatory Visit (INDEPENDENT_AMBULATORY_CARE_PROVIDER_SITE_OTHER): Payer: Medicaid Other | Admitting: Family Medicine

## 2022-02-28 ENCOUNTER — Encounter: Payer: Self-pay | Admitting: Family Medicine

## 2022-02-28 DIAGNOSIS — I10 Essential (primary) hypertension: Secondary | ICD-10-CM

## 2022-02-28 NOTE — Assessment & Plan Note (Signed)
Patient has demonstrated excellent blood pressure with current lisinopril regimen. Denies cardiopulmonary complaints. Still awaiting home sleep study, did discuss her reduced sleep due to time constraints / family obligation, and anxiety/stress. We discussed pursuing the home test, aiming to get 7-8 hours of sleep nightly, and nonpharmacologic methods to manage stress. Return in 3 months.

## 2022-02-28 NOTE — Patient Instructions (Signed)
-   Review information provided regarding stress management - Aim to get 7-8 hours of sleep nightly - Continue current blood pressure medication daily - Pursue home sleep study once you receive the materials - Return for follow-up in 3 months

## 2022-02-28 NOTE — Progress Notes (Signed)
     Primary Care / Sports Medicine Office Visit  Patient Information:  Patient ID: Tina Hughes, female DOB: 1991/04/23 Age: 31 y.o. MRN: [id number]   Tina Hughes is a pleasant 31 y.o. female presenting with the following:  Chief Complaint  Patient presents with   Hypertension    Pt states she is feeling fine with the medication.     Vitals:   02/28/22 0907  BP: 128/80  Pulse: 78  SpO2: 98%   Vitals:   02/28/22 0907  Weight: 153 lb 9.6 oz (69.7 kg)  Height: '5\' 6"'$  (1.676 m)   Body mass index is 24.79 kg/m.  No results found.   Independent interpretation of notes and tests performed by another provider:   None  Procedures performed:   None  Pertinent History, Exam, Impression, and Recommendations:   Problem List Items Addressed This Visit       Cardiovascular and Mediastinum   Hypertension    Patient has demonstrated excellent blood pressure with current lisinopril regimen. Denies cardiopulmonary complaints. Still awaiting home sleep study, did discuss her reduced sleep due to time constraints / family obligation, and anxiety/stress. We discussed pursuing the home test, aiming to get 7-8 hours of sleep nightly, and nonpharmacologic methods to manage stress. Return in 3 months.       I provided a total time of 30 minutes including both face-to-face and non-face-to-face time on 02/28/2022 inclusive of time utilized for medical chart review, information gathering, care coordination with staff, and documentation completion.   Orders & Medications No orders of the defined types were placed in this encounter.  No orders of the defined types were placed in this encounter.    Return in about 3 months (around 05/31/2022).     Montel Culver, MD   Primary Care Sports Medicine [place]

## 2022-04-18 ENCOUNTER — Encounter (INDEPENDENT_AMBULATORY_CARE_PROVIDER_SITE_OTHER): Payer: Medicaid Other | Admitting: Family Medicine

## 2022-04-18 DIAGNOSIS — R61 Generalized hyperhidrosis: Secondary | ICD-10-CM

## 2022-04-19 DIAGNOSIS — R61 Generalized hyperhidrosis: Secondary | ICD-10-CM

## 2022-04-19 MED ORDER — GLYCOPYRROLATE 1 MG PO TABS: ORAL_TABLET | ORAL | 1 refills | Status: DC

## 2022-04-19 NOTE — Telephone Encounter (Addendum)
    Primary Care / Sports Medicine Telephone Note  Patient Information:  Patient ID: RIONNA FELTES, female DOB: 1991/01/11 Age: 31 y.o. MRN: [id number]    Patient with reported longstanding hyperhidrosis symptoms, family history of the same, multiple sites of involvement including inguinal. Will Rx glycopyrrolate 1-2 mg up to BID for symptom control and schedule 1 month follow-up for assessment.  I provided a total time of 33 minutes time on 04/19/2022 inclusive of time utilized for medical chart review, information gathering, care coordination with patient, staff, and documentation completion.   Montel Culver, MD   Primary Care Sports Medicine [place]

## 2022-04-19 NOTE — Telephone Encounter (Signed)
Pt coming in 05/31/2022

## 2022-04-19 NOTE — Telephone Encounter (Signed)
Please advise 

## 2022-04-22 ENCOUNTER — Other Ambulatory Visit: Payer: Self-pay | Admitting: Family Medicine

## 2022-04-22 DIAGNOSIS — F419 Anxiety disorder, unspecified: Secondary | ICD-10-CM

## 2022-04-22 MED ORDER — SERTRALINE HCL 25 MG PO TABS: 25.0000 mg | ORAL_TABLET | Freq: Every day | ORAL | 3 refills | Status: DC

## 2022-04-22 NOTE — Telephone Encounter (Signed)
Please advise 

## 2022-05-12 ENCOUNTER — Other Ambulatory Visit: Payer: Self-pay | Admitting: Family Medicine

## 2022-05-12 DIAGNOSIS — I10 Essential (primary) hypertension: Secondary | ICD-10-CM

## 2022-05-12 MED ORDER — LISINOPRIL 10 MG PO TABS: 10.0000 mg | ORAL_TABLET | Freq: Every day | ORAL | 0 refills | Status: DC

## 2022-05-23 ENCOUNTER — Other Ambulatory Visit: Payer: Self-pay

## 2022-05-23 DIAGNOSIS — R61 Generalized hyperhidrosis: Secondary | ICD-10-CM

## 2022-05-23 MED ORDER — GLYCOPYRROLATE 1 MG PO TABS: ORAL_TABLET | ORAL | 0 refills | Status: DC

## 2022-05-25 ENCOUNTER — Ambulatory Visit: Payer: Self-pay | Admitting: *Deleted

## 2022-05-25 NOTE — Telephone Encounter (Signed)
  Chief Complaint: took double dose of BP medication by mistake took lisinopril 10 mg at 6:15 am and again by mistake at approx. 4:15 pm. Symptoms: none  Frequency: na Pertinent Negatives: Patient denies headache, no dizziness no weakness no nausea  Disposition: '[x]'$ ED /'[]'$ Urgent Care (no appt availability in office) / '[]'$ Appointment(In office/virtual)/ '[]'$  [facility]/ '[]'$ Home Care/ '[]'$ Refused Recommended Disposition /'[]'$ [facility]/ '[x]'$  Follow-up with PCP Additional Notes:   Patient reports as soon as she noticed taking wrong medication she forced self to vomit. Unsure if what she saw was pill. No sx. Now . Recommended if sx go to ED / call poison control.    Reason for Disposition  [1] DOUBLE DOSE (an extra dose or lesser amount) of prescription drug AND [2] NO symptoms  (Exception: A double dose of antibiotics.)  Answer Assessment - Initial Assessment Questions 1. SUBSTANCE: "What was swallowed?" If necessary, have the caller look at the product or drug label on the container to determine active ingredients.     Took extra dose of lisinopril 10 mg by mistake 2. AMOUNT: "How much was swallowed?" (e.g., what was the possible maximum amount)      Full 10 mg tablet but immediately forced self to vomit and thinks she saw pill in emesis  3. ONSET: "When was it probably swallowed?" (Minutes or hours ago)      Approx. 4:05 pm 4. SYMPTOMS: "Do you have any symptoms?" If Yes, ask: "What are they?" (e.g., abdomen pain, vomiting, weakness)      No , forced self to vomit 5. TREATMENT: "Have you done anything to treat this?" If Yes, ask: "What did you do?"     No  6. SUICIDAL: "Did you take this to hurt or kill yourself?"     na 7. PREGNANCY: "Is there any chance you are pregnant?" "When was your last menstrual period?"     na  Protocols used: Poisoning-A-[facility]

## 2022-05-25 NOTE — Telephone Encounter (Signed)
PC to pt, talked with pt after talking with Dr. Zigmund Daniel, pt is calm and will watch [place].

## 2022-05-31 ENCOUNTER — Ambulatory Visit (INDEPENDENT_AMBULATORY_CARE_PROVIDER_SITE_OTHER): Payer: Medicaid Other | Admitting: Family Medicine

## 2022-05-31 ENCOUNTER — Encounter: Payer: Self-pay | Admitting: Family Medicine

## 2022-05-31 VITALS — BP 118/74 | HR 68 | Ht 66.0 in | Wt 158.0 lb

## 2022-05-31 DIAGNOSIS — R61 Generalized hyperhidrosis: Secondary | ICD-10-CM | POA: Diagnosis not present

## 2022-05-31 DIAGNOSIS — I10 Essential (primary) hypertension: Secondary | ICD-10-CM

## 2022-05-31 DIAGNOSIS — F419 Anxiety disorder, unspecified: Secondary | ICD-10-CM

## 2022-05-31 MED ORDER — GLYCOPYRROLATE 2 MG PO TABS: 2.0000 mg | ORAL_TABLET | Freq: Two times a day (BID) | ORAL | 0 refills | Status: DC

## 2022-05-31 MED ORDER — LISINOPRIL 10 MG PO TABS: 10.0000 mg | ORAL_TABLET | Freq: Every day | ORAL | 0 refills | Status: DC

## 2022-05-31 NOTE — Assessment & Plan Note (Signed)
Excellent control with lifestyle changes and nonpharmacologic management, will continue monitor this chronic stable condition.

## 2022-05-31 NOTE — Patient Instructions (Signed)
-   Utilize new 2 mg glycopyrrolate twice daily - Continue current lisinopril dose - Continue healthy lifestyle changes as you have been (diet/exercise) - Referral coordinator will contact you to schedule follow-up with nephrologist - Contact us for any questions and return for annual exam 12/2022 timeframe

## 2022-05-31 NOTE — Assessment & Plan Note (Signed)
Chronic condition showing excellent control on current lisinopril 10 mg dose.  Additionally, patient has been increasing cardiovascular exercise and making dietary changes.  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  Also brings up longstanding potassium related disorder in her mother.  While patient's potassium levels have always been normal, she does have hypertension with otherwise low risk factors and subtle anion gap elevations calculated from her last 2 sets of labs.  A referral to nephrology has been placed for further evaluation/management of this concern.

## 2022-05-31 NOTE — Progress Notes (Signed)
     Primary Care / Sports Medicine Office Visit  Patient Information:  Patient ID: Tina Hughes, female DOB: June 10, 1991 Age: 31 y.o. MRN: [id number]   Tina Hughes is a pleasant 31 y.o. female presenting with the following:  Chief Complaint  Patient presents with   Follow-up    Sweating is much better, feels like mood is stable, sister was DX with Liddle's syndrome, would like to talk about that    Vitals:   05/31/22 0858  BP: 118/74  Pulse: 68  SpO2: 99%   Vitals:   05/31/22 0858  Weight: 158 lb (71.7 kg)  Height: '5\' 6"'$  (1.676 m)   Body mass index is 25.5 kg/m.  No results found.   Independent interpretation of notes and tests performed by another provider:   None  Procedures performed:   None  Pertinent History, Exam, Impression, and Recommendations:   Problem List Items Addressed This Visit       Cardiovascular and Mediastinum   Hypertension    Chronic condition showing excellent control on current lisinopril 10 mg dose.  Additionally, patient has been increasing cardiovascular exercise and making dietary changes.  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  Also brings up longstanding potassium related disorder in her mother.  While patient's potassium levels have always been normal, she does have hypertension with otherwise low risk factors and subtle anion gap elevations calculated from her last 2 sets of labs.  A referral to nephrology has been placed for further evaluation/management of this concern.      Relevant Medications   lisinopril (ZESTRIL) 10 MG tablet   Other Relevant Orders   Ambulatory referral to Nephrology     Musculoskeletal and Integument   Hyperhidrosis - Primary    Chronic condition demonstrating recent control with initiation of glycopyrrolate 1 mg, titration has been reviewed, is stable on 2 mg twice daily, new Rx provided and advice on continued treatment  discussed.      Relevant Medications   glycopyrrolate (ROBINUL) 2 MG tablet     Other   Anxiety    Excellent control with lifestyle changes and nonpharmacologic management, will continue monitor this chronic stable condition.        Orders & Medications Meds ordered this encounter  Medications   glycopyrrolate (ROBINUL) 2 MG tablet    Sig: Take 1 tablet (2 mg total) by mouth 2 (two) times daily.    Dispense:  180 tablet    Refill:  0   lisinopril (ZESTRIL) 10 MG tablet    Sig: Take 1 tablet (10 mg total) by mouth daily.    Dispense:  90 tablet    Refill:  0   Orders Placed This Encounter  Procedures   Ambulatory referral to Nephrology     Return in about 8 months (around 01/16/2023) for CPE.     Montel Culver, MD   Primary Care Sports Medicine [place]

## 2022-05-31 NOTE — Assessment & Plan Note (Signed)
Chronic condition demonstrating recent control with initiation of glycopyrrolate 1 mg, titration has been reviewed, is stable on 2 mg twice daily, new Rx provided and advice on continued treatment discussed.

## 2022-07-04 ENCOUNTER — Encounter: Payer: Self-pay | Admitting: Family Medicine

## 2022-07-04 NOTE — Telephone Encounter (Signed)
Please advise 

## 2022-07-11 ENCOUNTER — Other Ambulatory Visit: Payer: Self-pay

## 2022-07-11 DIAGNOSIS — S4992XA Unspecified injury of left shoulder and upper arm, initial encounter: Secondary | ICD-10-CM

## 2022-07-12 ENCOUNTER — Ambulatory Visit (INDEPENDENT_AMBULATORY_CARE_PROVIDER_SITE_OTHER): Payer: Medicaid Other | Admitting: Family Medicine

## 2022-07-12 ENCOUNTER — Encounter: Payer: Self-pay | Admitting: Family Medicine

## 2022-07-12 ENCOUNTER — Ambulatory Visit
Admission: RE | Admit: 2022-07-12 | Discharge: 2022-07-12 | Disposition: A | Discharge status: Home / Self Care | Payer: Medicaid Other | Attending: Family Medicine | Admitting: Family Medicine

## 2022-07-12 ENCOUNTER — Ambulatory Visit
Admission: RE | Admit: 2022-07-12 | Discharge: 2022-07-12 | Disposition: A | Discharge status: Home / Self Care | Payer: Medicaid Other | Source: Ambulatory Visit | Attending: Family Medicine | Admitting: Family Medicine

## 2022-07-12 VITALS — BP 104/68 | HR 86 | Ht 66.0 in | Wt 158.0 lb

## 2022-07-12 DIAGNOSIS — S40012A Contusion of left shoulder, initial encounter: Secondary | ICD-10-CM | POA: Diagnosis not present

## 2022-07-12 DIAGNOSIS — S4992XA Unspecified injury of left shoulder and upper arm, initial encounter: Secondary | ICD-10-CM | POA: Insufficient documentation

## 2022-07-12 DIAGNOSIS — M25512 Pain in left shoulder: Secondary | ICD-10-CM | POA: Diagnosis not present

## 2022-07-12 DIAGNOSIS — R61 Generalized hyperhidrosis: Secondary | ICD-10-CM | POA: Diagnosis not present

## 2022-07-12 MED ORDER — DICLOFENAC SODIUM 50 MG PO TBEC: 50.0000 mg | DELAYED_RELEASE_TABLET | Freq: Two times a day (BID) | ORAL | 0 refills | Status: DC

## 2022-07-12 NOTE — Progress Notes (Signed)
     Primary Care / Sports Medicine Office Visit  Patient Information:  Patient ID: Tina Hughes, female DOB: 10/25/1990 Age: 31 y.o. MRN: [id number]   Tina Hughes is a pleasant 31 y.o. female presenting with the following:  Chief Complaint  Patient presents with   Shoulder Pain    Left shoulder pain, jammed it into side mirror of truck 5-6 weeks ago.     Vitals:   07/12/22 1057  BP: 104/68  Pulse: 86  SpO2: 98%   Vitals:   07/12/22 1057  Weight: 158 lb (71.7 kg)  Height: '5\' 6"'$  (1.676 m)   Body mass index is 25.5 kg/m.  No results found.   Independent interpretation of notes and tests performed by another provider:   None  Procedures performed:   None  Pertinent History, Exam, Impression, and Recommendations:   Problem List Items Addressed This Visit       Musculoskeletal and Integument   Hyperhidrosis    Chronic condition that had demonstrated relative control with glycopyrrolate 2 mg daily, has had episodes of worsening symptoms, outlined titration method. Not to exceed 6-8 mg but can revisit anxiety pharmacotherapy if needed.        Other   Contusion of left shoulder - Primary    RHD patient with 5-6 weeks of acute traumatic left superolateral shoulder pain. Came up and struck her left shoulder on the side mirror of a truck, acute pain, no weakness, no bruising. Has been resting it and taking sporadic ibuprofen. Denies radiation of symptoms, no clicking, no weakness.  Exam with full ROM, mild pain with maximal overhead, slightly reduced reaching to lower scapula as contralateral to mid-scapula, isolated RC testing with pain during ER, 5/5 strength otherwise throughout, mild tenderness at [facility] joint, -Neer's, +Hawkin's, +O'Brien's, -Speed's, -Yergason's, -Cross body.  Examination and x-rays most consistent with some focality to the glenohumeral joint and rotator cuff, overall picture of contusion and resolving RC tendinopathy. Plan for diclofenac  regimen, home exercises, and for any persistent symptoms without improvement in several weeks, advanced imaging.      Relevant Medications   diclofenac (VOLTAREN) 50 MG EC tablet     Orders & Medications Meds ordered this encounter  Medications   diclofenac (VOLTAREN) 50 MG EC tablet    Sig: Take 1 tablet (50 mg total) by mouth 2 (two) times daily.    Dispense:  60 tablet    Refill:  0   No orders of the defined types were placed in this encounter.    No follow-ups on file.     Montel Culver, MD   Primary Care Sports Medicine [place]

## 2022-07-12 NOTE — Assessment & Plan Note (Signed)
Chronic condition that had demonstrated relative control with glycopyrrolate 2 mg daily, has had episodes of worsening symptoms, outlined titration method. Not to exceed 6-8 mg but can revisit anxiety pharmacotherapy if needed.

## 2022-07-12 NOTE — Patient Instructions (Addendum)
-   Continue daily 2 mg glycopyrrolate  - Can dose 1 mg (half tab) at midday and evening if needed for further symptom control - Contact our office via MyChart message if needing consistent increased dose or if symptoms not fully controlled with regimen - Can trial topical Voltaren gel (diclofenac1%), if effective continue 2 times daily with additional 2 doses being reserved for as-needed dosing - If ineffective, dose oral Rx diclofenac daily x 2 weeks (second dose and doses beyond 2 weeks can be taken as-needed) - Start home exercises - Contact us in January for any persistent symptoms and to provide status update

## 2022-07-12 NOTE — Assessment & Plan Note (Signed)
RHD patient with 5-6 weeks of acute traumatic left superolateral shoulder pain. Came up and struck her left shoulder on the side mirror of a truck, acute pain, no weakness, no bruising. Has been resting it and taking sporadic ibuprofen. Denies radiation of symptoms, no clicking, no weakness.  Exam with full ROM, mild pain with maximal overhead, slightly reduced reaching to lower scapula as contralateral to mid-scapula, isolated RC testing with pain during ER, 5/5 strength otherwise throughout, mild tenderness at [facility] joint, -Neer's, +Hawkin's, +O'Brien's, -Speed's, -Yergason's, -Cross body.  Examination and x-rays most consistent with some focality to the glenohumeral joint and rotator cuff, overall picture of contusion and resolving RC tendinopathy. Plan for diclofenac regimen, home exercises, and for any persistent symptoms without improvement in several weeks, advanced imaging.

## 2022-07-13 ENCOUNTER — Ambulatory Visit
Admission: EM | Admit: 2022-07-13 | Discharge: 2022-07-13 | Disposition: A | Discharge status: Home / Self Care | Payer: Medicaid Other | Attending: Emergency Medicine | Admitting: Emergency Medicine

## 2022-07-13 DIAGNOSIS — N3000 Acute cystitis without hematuria: Secondary | ICD-10-CM | POA: Insufficient documentation

## 2022-07-13 LAB — URINALYSIS, MICROSCOPIC (REFLEX)

## 2022-07-13 LAB — URINALYSIS, ROUTINE W REFLEX MICROSCOPIC
Bilirubin Urine: NEGATIVE
Glucose, UA: NEGATIVE mg/dL
Ketones, ur: NEGATIVE mg/dL
Nitrite: NEGATIVE
Protein, ur: 30 mg/dL — AB
Specific Gravity, Urine: 1.025 (ref 1.005–1.030)
pH: 5.5 (ref 5.0–8.0)

## 2022-07-13 MED ORDER — NITROFURANTOIN MONOHYD MACRO 100 MG PO CAPS: 100.0000 mg | ORAL_CAPSULE | Freq: Two times a day (BID) | ORAL | 0 refills | Status: DC

## 2022-07-13 NOTE — ED Triage Notes (Signed)
Patient reports that she is having burning with urination and urinary frequency that started Sunday. Patient reports that she did taker AZO MAX for 2 days with no help.

## 2022-07-13 NOTE — Discharge Instructions (Signed)
Your urinalysis shows Tina Hughes blood cells and a small amount  of bacteria, your urine will be sent to the lab to determine exactly which bacteria is present, if any changes need to be made to your medications you will be notified  Begin use of Macrobid twice a day for 5 days   You may use over-the-counter Azo to help minimize your symptoms until antibiotic removes bacteria, this medication will turn your urine orange  Increase your fluid intake through use of water  As always practice good hygiene, wiping front to back and avoidance of scented vaginal products to prevent further irritation  If symptoms continue to persist after use of medication or recur please follow-up with urgent care or your primary doctor as needed

## 2022-07-13 NOTE — ED Provider Notes (Signed)
[facility]    CSN: [id number] Arrival date & time: 07/13/22  1035      History   Chief Complaint Chief Complaint  Patient presents with   Urinary Frequency   Dysuria    HPI Tina Hughes is a 31 y.o. female.   Patient presents urinary frequency, dysuria and lower abdominal pain and pressure for 3 days. Associated mild vaginal itching.  Has attempted use of Azo which has been minimally effective.  Sexually active, 1 female partner, no condom use, no concern for STI at this time, no known exposure.  Denies hematuria, flank pain, fever, chills, vaginal discharge, odor or irritation.  For 3 days, azo,    Past Medical History:  Diagnosis Date   Anxiety    Hypertension     Patient Active Problem List   Diagnosis Date Noted   Contusion of left shoulder 07/12/2022   Hyperhidrosis 05/31/2022   Annual physical exam 01/10/2022   Hypertension 12/10/2021   History of gestational hypertension 10/11/2017   Anxiety 03/31/2017    Past Surgical History:  Procedure Laterality Date   TUBAL LIGATION Bilateral 02/22/2020   Procedure: POST PARTUM TUBAL LIGATION;  Surgeon: Rubie Maid, MD;  Location: [facility];  Service: Gynecology;  Laterality: Bilateral;    OB History     Gravida  3   Para  3   Term  3   Preterm      AB      Living  3      SAB      IAB      Ectopic      Multiple  0   Live Births  3            Home Medications    Prior to Admission medications   Medication Sig Start Date End Date Taking? Authorizing Provider  diclofenac (VOLTAREN) 50 MG EC tablet Take 1 tablet (50 mg total) by mouth 2 (two) times daily. 07/12/22   Montel Culver, MD  glycopyrrolate (ROBINUL) 2 MG tablet Take 1 tablet (2 mg total) by mouth 2 (two) times daily. 05/31/22   Montel Culver, MD  lisinopril (ZESTRIL) 10 MG tablet Take 1 tablet (10 mg total) by mouth daily. 05/31/22   Montel Culver, MD    Family History Family History  Problem Relation  Age of Onset   Irritable bowel syndrome Mother    Other Sister        Liddle's syndrome   Hypertension Sister    Cancer Paternal Grandmother        skin cancer   Breast cancer Neg Hx    Ovarian cancer Neg Hx    Colon cancer Neg Hx     Social History Social History   Tobacco Use   Smoking status: Former    Packs/day: 0.10    Types: Cigarettes    Quit date: 10/2021    Years since quitting: 0.7   Smokeless tobacco: Never  Vaping Use   Vaping Use: Never used  Substance Use Topics   Alcohol use: Yes    Comment: occasional   Drug use: No     Allergies   Patient has no known allergies.   Review of Systems Review of Systems  Constitutional: Negative.   HENT: Negative.    Respiratory: Negative.    Cardiovascular: Negative.   Genitourinary:  Positive for dysuria, frequency and pelvic pain. Negative for decreased urine volume, difficulty urinating, dyspareunia, enuresis, flank pain, genital sores, hematuria, menstrual  problem, urgency, vaginal bleeding, vaginal discharge and vaginal pain.  Skin: Negative.   Psychiatric/Behavioral: Negative.       Physical Exam Triage Vital Signs ED Triage Vitals  Enc Vitals Group     BP 07/13/22 1202 112/69     Pulse Rate 07/13/22 1202 67     Resp --      Temp 07/13/22 1202 98.6 F (37 C)     Temp Source 07/13/22 1202 Oral     SpO2 07/13/22 1202 98 %     Weight 07/13/22 1201 158 lb (71.7 kg)     Height 07/13/22 1201 '5\' 6"'$  (1.676 m)     Head Circumference --      Peak Flow --      Pain Score 07/13/22 1201 8     Pain Loc --      Pain Edu? --      Excl. in [place]? --    No data found.  Updated Vital Signs BP 112/69 (BP Location: Left Arm)   Pulse 67   Temp 98.6 F (37 C) (Oral)   Ht '5\' 6"'$  (1.676 m)   Wt 158 lb (71.7 kg)   LMP 06/29/2022 (Approximate)   SpO2 98%   Breastfeeding No   BMI 25.50 kg/m   Visual Acuity Right Eye Distance:   Left Eye Distance:   Bilateral Distance:    Right Eye Near:   Left Eye Near:     Bilateral Near:     Physical Exam Constitutional:      Appearance: Normal appearance.  HENT:     Head: Normocephalic.  Eyes:     Extraocular Movements: Extraocular movements intact.  Pulmonary:     Effort: Pulmonary effort is normal.  Abdominal:     General: Abdomen is flat. Bowel sounds are normal.     Palpations: Abdomen is soft.     Tenderness: There is abdominal tenderness in the suprapubic area. There is no right CVA tenderness or left CVA tenderness.  Neurological:     Mental Status: She is alert and oriented to person, place, and time. Mental status is at baseline.      UC Treatments / Results  Labs (all labs ordered are listed, but only abnormal results are displayed) Labs Reviewed  URINALYSIS, ROUTINE W REFLEX MICROSCOPIC - Abnormal; Notable for the following components:      Result Value   APPearance HAZY (*)    Hgb urine dipstick TRACE (*)    Protein, ur 30 (*)    Leukocytes,Ua SMALL (*)    All other components within normal limits  URINALYSIS, MICROSCOPIC (REFLEX) - Abnormal; Notable for the following components:   Bacteria, UA FEW (*)    All other components within normal limits    EKG   Radiology No results found.  Procedures Procedures (including critical care time)  Medications Ordered in UC Medications - No data to display  Initial Impression / Assessment and Plan / UC Course  I have reviewed the triage vital signs and the nursing notes.  Pertinent labs & imaging results that were available during my care of the patient were reviewed by me and considered in my medical decision making (see chart for details).  Acute cystitis without hematuria.   Urinalysis showing leukocytes and few bacteria, neg for nitrates, sent for culture, discussed findings with patient, Macrobid prescribed, recommended continued use of Azo, increase fluid intake and good hygiene for additional supportive cares, advised strict follow-up precautions if symptoms persist or  worsen Final  Clinical Impressions(s) / UC Diagnoses   Final diagnoses:  None   Discharge Instructions   None    ED Prescriptions   None    PDMP not reviewed this encounter.   Hans Eden, NP 07/13/22 1243

## 2022-07-15 LAB — URINE CULTURE: Culture: 60000 — AB

## 2022-07-25 ENCOUNTER — Ambulatory Visit (INDEPENDENT_AMBULATORY_CARE_PROVIDER_SITE_OTHER): Payer: Medicaid Other | Admitting: Dermatology

## 2022-07-25 DIAGNOSIS — L814 Other melanin hyperpigmentation: Secondary | ICD-10-CM

## 2022-07-25 DIAGNOSIS — L719 Rosacea, unspecified: Secondary | ICD-10-CM | POA: Diagnosis not present

## 2022-07-25 DIAGNOSIS — D229 Melanocytic nevi, unspecified: Secondary | ICD-10-CM

## 2022-07-25 DIAGNOSIS — Z808 Family history of malignant neoplasm of other organs or systems: Secondary | ICD-10-CM

## 2022-07-25 DIAGNOSIS — Z79899 Other long term (current) drug therapy: Secondary | ICD-10-CM | POA: Diagnosis not present

## 2022-07-25 DIAGNOSIS — L821 Other seborrheic keratosis: Secondary | ICD-10-CM

## 2022-07-25 DIAGNOSIS — D2262 Melanocytic nevi of left upper limb, including shoulder: Secondary | ICD-10-CM

## 2022-07-25 DIAGNOSIS — Z1283 Encounter for screening for malignant neoplasm of skin: Secondary | ICD-10-CM

## 2022-07-25 DIAGNOSIS — L578 Other skin changes due to chronic exposure to nonionizing radiation: Secondary | ICD-10-CM | POA: Diagnosis not present

## 2022-07-25 DIAGNOSIS — D485 Neoplasm of uncertain behavior of skin: Secondary | ICD-10-CM

## 2022-07-25 DIAGNOSIS — D239 Other benign neoplasm of skin, unspecified: Secondary | ICD-10-CM

## 2022-07-25 HISTORY — DX: Other benign neoplasm of skin, unspecified: D23.9

## 2022-07-25 MED ORDER — KETOCONAZOLE 2 % EX SHAM: 1.0000 | MEDICATED_SHAMPOO | CUTANEOUS | 11 refills | Status: DC

## 2022-07-25 NOTE — Patient Instructions (Signed)
Wound Care Instructions  Cleanse wound gently with soap and water once a day then pat dry with clean gauze. Apply a thin coat of Petrolatum (petroleum jelly, "Vaseline") over the wound (unless you have an allergy to this). We recommend that you use a new, sterile tube of Vaseline. Do not pick or remove scabs. Do not remove the yellow or white "healing tissue" from the base of the wound.  Cover the wound with fresh, clean, nonstick gauze and secure with paper tape. You may use Band-Aids in place of gauze and tape if the wound is small enough, but would recommend trimming much of the tape off as there is often too much. Sometimes Band-Aids can irritate the skin.  You should call the office for your biopsy report after 1 week if you have not already been contacted.  If you experience any problems, such as abnormal amounts of bleeding, swelling, significant bruising, significant pain, or evidence of infection, please call the office immediately.  FOR ADULT SURGERY PATIENTS: If you need something for pain relief you may take 1 extra strength Tylenol (acetaminophen) AND 2 Ibuprofen (200mg each) together every 4 hours as needed for pain. (do not take these if you are allergic to them or if you have a reason you should not take them.) Typically, you may only need pain medication for 1 to 3 days.     Due to recent changes in healthcare laws, you may see results of your pathology and/or laboratory studies on MyChart before the doctors have had a chance to review them. We understand that in some cases there may be results that are confusing or concerning to you. Please understand that not all results are received at the same time and often the doctors may need to interpret multiple results in order to provide you with the best plan of care or course of treatment. Therefore, we ask that you please give us 2 business days to thoroughly review all your results before contacting the office for clarification. Should  we see a critical lab result, you will be contacted sooner.   If You Need Anything After Your Visit  If you have any questions or concerns for your doctor, please call our main line at [phone] and press option 4 to reach your doctor's medical assistant. If no one answers, please leave a voicemail as directed and we will return your call as soon as possible. Messages left after 4 pm will be answered the following business day.   You may also send us a message via MyChart. We typically respond to MyChart messages within 1-2 business days.  For prescription refills, please ask your pharmacy to contact our office. Our fax number is [phone].  If you have an urgent issue when the clinic is closed that cannot wait until the next business day, you can page your doctor at the number below.    Please note that while we do our best to be available for urgent issues outside of office hours, we are not available 24/7.   If you have an urgent issue and are unable to reach us, you may choose to seek medical care at your doctor's office, retail clinic, urgent care center, or emergency room.  If you have a medical emergency, please immediately call 911 or go to the emergency department.  Pager Numbers  - Dr. Kowalski: [phone]  - Dr. Moye: [phone]  - Dr. Stewart: [phone]  In the event of inclement weather, please call our main line at   [phone] for an update on the status of any delays or closures.  Dermatology Medication Tips: Please keep the boxes that topical medications come in in order to help keep track of the instructions about where and how to use these. Pharmacies typically print the medication instructions only on the boxes and not directly on the medication tubes.   If your medication is too expensive, please contact our office at [phone] option 4 or send us a message through MyChart.   We are unable to tell what your co-pay for medications will be in  advance as this is different depending on your insurance coverage. However, we may be able to find a substitute medication at lower cost or fill out paperwork to get insurance to cover a needed medication.   If a prior authorization is required to get your medication covered by your insurance company, please allow us 1-2 business days to complete this process.  Drug prices often vary depending on where the prescription is filled and some pharmacies may offer cheaper prices.  The website www.goodrx.com contains coupons for medications through different pharmacies. The prices here do not account for what the cost may be with help from insurance (it may be cheaper with your insurance), but the website can give you the price if you did not use any insurance.  - You can print the associated coupon and take it with your prescription to the pharmacy.  - You may also stop by our office during regular business hours and pick up a GoodRx coupon card.  - If you need your prescription sent electronically to a different pharmacy, notify our office through [facility] MyChart or by phone at [phone] option 4.     Si Usted Necesita Algo Despus de Su Visita  Tambin puede enviarnos un mensaje a travs de MyChart. Por lo general respondemos a los mensajes de MyChart en el transcurso de 1 a 2 das hbiles.  Para renovar recetas, por favor pida a su farmacia que se ponga en contacto con nuestra oficina. Nuestro nmero de fax es el [phone].  Si tiene un asunto urgente cuando la clnica est cerrada y que no puede esperar hasta el siguiente da hbil, puede llamar/localizar a su doctor(a) al nmero que aparece a continuacin.   Por favor, tenga en cuenta que aunque hacemos todo lo posible para estar disponibles para asuntos urgentes fuera del horario de oficina, no estamos disponibles las 24 horas del da, los 7 das de la semana.   Si tiene un problema urgente y no puede comunicarse con nosotros, puede  optar por buscar atencin mdica  en el consultorio de su doctor(a), en una clnica privada, en un centro de atencin urgente o en una sala de emergencias.  Si tiene una emergencia mdica, por favor llame inmediatamente al 911 o vaya a la sala de emergencias.  Nmeros de bper  - Dr. Kowalski: [phone]  - Dra. Moye: [phone]  - Dra. Stewart: [phone]  En caso de inclemencias del tiempo, por favor llame a nuestra lnea principal al [phone] para una actualizacin sobre el estado de cualquier retraso o cierre.  Consejos para la medicacin en dermatologa: Por favor, guarde las cajas en las que vienen los medicamentos de uso tpico para ayudarle a seguir las instrucciones sobre dnde y cmo usarlos. Las farmacias generalmente imprimen las instrucciones del medicamento slo en las cajas y no directamente en los tubos del medicamento.   Si su medicamento es muy caro, por favor, pngase en contacto con   nuestra oficina llamando al [phone] y presione la opcin 4 o envenos un mensaje a travs de MyChart.   No podemos decirle cul ser su copago por los medicamentos por adelantado ya que esto es diferente dependiendo de la cobertura de su seguro. Sin embargo, es posible que podamos encontrar un medicamento sustituto a menor costo o llenar un formulario para que el seguro cubra el medicamento que se considera necesario.   Si se requiere una autorizacin previa para que su compaa de seguros cubra su medicamento, por favor permtanos de 1 a 2 das hbiles para completar este proceso.  Los precios de los medicamentos varan con frecuencia dependiendo del lugar de dnde se surte la receta y alguna farmacias pueden ofrecer precios ms baratos.  El sitio web www.goodrx.com tiene cupones para medicamentos de diferentes farmacias. Los precios aqu no tienen en cuenta lo que podra costar con la ayuda del seguro (puede ser ms barato con su seguro), pero el sitio web puede darle el  precio si no utiliz ningn seguro.  - Puede imprimir el cupn correspondiente y llevarlo con su receta a la farmacia.  - Tambin puede pasar por nuestra oficina durante el horario de atencin regular y recoger una tarjeta de cupones de GoodRx.  - Si necesita que su receta se enve electrnicamente a una farmacia diferente, informe a nuestra oficina a travs de MyChart de [facility] o por telfono llamando al [phone] y presione la opcin 4.  

## 2022-07-25 NOTE — Progress Notes (Signed)
New Patient Visit  Subjective  Tina Hughes is a 31 y.o. female who presents for the following: Other (New patient - No history of skin cancer - family history of Melanoma (grandmother) and family history of SCC (sister) - The patient presents for Total-Body Skin Exam (TBSE) for skin cancer screening and mole check.  The patient has spots, moles and lesions to be evaluated, some may be new or changing and the patient has concerns that these could be cancer./).  The following portions of the chart were reviewed this encounter and updated as appropriate:   Tobacco  Allergies  Meds  Problems  Med Hx  Surg Hx  Fam Hx     Review of Systems:  No other skin or systemic complaints except as noted in HPI or Assessment and Plan.  Objective  Well appearing patient in no apparent distress; mood and affect are within normal limits.  A full examination was performed including scalp, head, eyes, ears, nose, lips, neck, chest, axillae, abdomen, back, buttocks, bilateral upper extremities, bilateral lower extremities, hands, feet, fingers, toes, fingernails, and toenails. All findings within normal limits unless otherwise noted below.  Left middle medial scapula 0.6 x 0.4 cm irregular brown macule        Assessment & Plan   Family history of skin cancer - what type(s):Melanoma, SCC - who affected: Grandmother (melanoma), Sister (SCC)  Lentigines - Scattered tan macules - Due to sun exposure - Benign-appearing, observe - Recommend daily broad spectrum sunscreen SPF 30+ to sun-exposed areas, reapply every 2 hours as needed. - Call for any changes  Seborrheic Keratoses - Stuck-on, waxy, tan-brown papules and/or plaques  - Benign-appearing - Discussed benign etiology and prognosis. - Observe - Call for any changes  Melanocytic Nevi - Tan-brown and/or pink-flesh-colored symmetric macules and papules - Benign appearing on exam today - Observation - Call clinic for new or  changing moles - Recommend daily use of broad spectrum spf 30+ sunscreen to sun-exposed areas.   Hemangiomas - Red papules - Discussed benign nature - Observe - Call for any changes  Actinic Damage - Chronic condition, secondary to cumulative UV/sun exposure - diffuse scaly erythematous macules with underlying dyspigmentation - Recommend daily broad spectrum sunscreen SPF 30+ to sun-exposed areas, reapply every 2 hours as needed.  - Staying in the shade or wearing long sleeves, sun glasses (UVA+UVB protection) and wide brim hats (4-inch brim around the entire circumference of the hat) are also recommended for sun protection.  - Call for new or changing lesions.  Skin cancer screening performed today.  Rosacea/pityrosporum folliculitis/folliculitis Scalp Start Ketoconazole 2% shampoo 2-3 times per week That are other treatment options the future depending on how well she does. Seborrheic Dermatitis  -  is a chronic persistent rash characterized by pinkness and scaling most commonly of the mid face but also can occur on the scalp (dandruff), ears; mid chest, mid back and groin.  It tends to be exacerbated by stress and cooler weather.  People who have neurologic disease may experience new onset or exacerbation of existing seborrheic dermatitis.  The condition is not curable but treatable and can be controlled.  Rosacea is a chronic progressive skin condition usually affecting the face of adults, causing redness and/or acne bumps. It is treatable but not curable. It sometimes affects the eyes (ocular rosacea) as well. It may respond to topical and/or systemic medication and can flare with stress, sun exposure, alcohol, exercise, topical steroids (including hydrocortisone/cortisone 10) and some foods.  Daily application of broad spectrum spf 30+ sunscreen to face is recommended to reduce flares.  ketoconazole (NIZORAL) 2 % shampoo - Scalp Apply 1 Application topically 2 (two) times a  week.  Neoplasm of uncertain behavior of skin Left middle medial scapula Epidermal / dermal shaving  Lesion diameter (cm):  0.6 Informed consent: discussed and consent obtained   Timeout: patient name, date of birth, surgical site, and procedure verified   Procedure prep:  Patient was prepped and draped in usual sterile fashion Prep type:  Isopropyl alcohol Anesthesia: the lesion was anesthetized in a standard fashion   Anesthetic:  1% lidocaine w/ epinephrine 1-100,000 buffered w/ 8.4% NaHCO3 Instrument used: flexible razor blade   Hemostasis achieved with: pressure, aluminum chloride and electrodesiccation   Outcome: patient tolerated procedure well   Post-procedure details: sterile dressing applied and wound care instructions given   Dressing type: bandage and petrolatum    Specimen 1 - Surgical pathology Differential Diagnosis: Nevus vs dysplastic nevus  Check Margins: No  Return in about 1 year (around 07/26/2023) for TBSE.  I, Ashok Cordia, CMA, am acting as scribe for Sarina Ser, MD . Documentation: I have reviewed the above documentation for accuracy and completeness, and I agree with the above.  Sarina Ser, MD

## 2022-08-03 ENCOUNTER — Telehealth: Payer: Self-pay

## 2022-08-03 NOTE — Telephone Encounter (Signed)
Patient called requesting biopsy results discussed with patient the lab is behind on sending results, we will call her when we receive the pathology results

## 2022-08-04 ENCOUNTER — Encounter: Payer: Self-pay | Admitting: Dermatology

## 2022-08-04 ENCOUNTER — Telehealth: Payer: Self-pay

## 2022-08-04 NOTE — Telephone Encounter (Signed)
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  Make 6 mos appt for pt.

## 2022-08-04 NOTE — Telephone Encounter (Signed)
Patient advised pathology showed moderate to severe atypia. Patient scheduled for 6 month follow up. Lurlean Horns., RMA

## 2022-08-07 ENCOUNTER — Encounter: Payer: Self-pay | Admitting: Dermatology

## 2022-08-18 ENCOUNTER — Encounter: Payer: Self-pay | Admitting: Family Medicine

## 2022-08-18 NOTE — Telephone Encounter (Signed)
Please advise 

## 2022-09-03 ENCOUNTER — Other Ambulatory Visit: Payer: Self-pay | Admitting: Family Medicine

## 2022-09-03 DIAGNOSIS — I10 Essential (primary) hypertension: Secondary | ICD-10-CM

## 2022-09-04 ENCOUNTER — Other Ambulatory Visit: Payer: Self-pay | Admitting: Family Medicine

## 2022-09-04 DIAGNOSIS — R61 Generalized hyperhidrosis: Secondary | ICD-10-CM

## 2022-09-04 DIAGNOSIS — I10 Essential (primary) hypertension: Secondary | ICD-10-CM

## 2022-09-04 MED ORDER — LISINOPRIL 10 MG PO TABS: 10.0000 mg | ORAL_TABLET | Freq: Every day | ORAL | 0 refills | Status: DC

## 2022-09-04 MED ORDER — GLYCOPYRROLATE 2 MG PO TABS: 2.0000 mg | ORAL_TABLET | Freq: Two times a day (BID) | ORAL | 0 refills | Status: DC

## 2022-09-05 NOTE — Telephone Encounter (Signed)
Unable to refill per protocol, last refill by provider 09/04/21 for 90 days. E-Prescribing Status: Receipt confirmed by pharmacy (09/04/2022  7:51 PM EST). Will refuse duplicate request.  Requested Prescriptions  Pending Prescriptions Disp Refills   lisinopril (ZESTRIL) 10 MG tablet [Pharmacy Med Name: Lisinopril 10 MG Oral Tablet] 90 tablet 0    Sig: Take 1 tablet by mouth once daily     Cardiovascular:  ACE Inhibitors Failed - 09/03/2022  1:11 PM      Failed - Cr in normal range and within 180 days    Creatinine, Ser  Date Value Ref Range Status  12/13/2021 0.76 0.57 - 1.00 mg/dL Final         Failed - K in normal range and within 180 days    Potassium  Date Value Ref Range Status  12/13/2021 3.9 3.5 - 5.2 mmol/L Final         Passed - Patient is not pregnant      Passed - Last BP in normal range    BP Readings from Last 1 Encounters:  07/13/22 112/69         Passed - Valid encounter within last 6 months    Recent Outpatient Visits           1 month ago Contusion of left shoulder, initial encounter   [facility] [place], Earley Abide, MD   3 months ago Hyperhidrosis    [facility] [place], Earley Abide, MD   6 months ago Hypertension, unspecified type   [facility] at [place], Earley Abide, MD   7 months ago Annual physical exam   [facility] at [place], Earley Abide, MD   8 months ago Hypertension, unspecified type   [facility] at [facility], Earley Abide, MD       Future Appointments             In 4 months Ralene Bathe, MD [place]   In 10 months Ralene Bathe, MD [place]

## 2022-09-07 DIAGNOSIS — I10 Essential (primary) hypertension: Secondary | ICD-10-CM | POA: Diagnosis not present

## 2022-11-28 ENCOUNTER — Other Ambulatory Visit: Payer: Self-pay | Admitting: Family Medicine

## 2022-11-28 DIAGNOSIS — I10 Essential (primary) hypertension: Secondary | ICD-10-CM

## 2022-11-29 NOTE — Telephone Encounter (Signed)
Requested medications are due for refill today.  yes  Requested medications are on the active medications list.  yes  Last refill. 09/04/2022 #90 0 rf  Future visit scheduled.   no  Notes to clinic.  Labs are expired.    Requested Prescriptions  Pending Prescriptions Disp Refills   lisinopril (ZESTRIL) 10 MG tablet [Pharmacy Med Name: Lisinopril 10 MG Oral Tablet] 90 tablet 0    Sig: Take 1 tablet by mouth once daily     Cardiovascular:  ACE Inhibitors Failed - 11/28/2022  9:11 AM      Failed - Cr in normal range and within 180 days    Creatinine, Ser  Date Value Ref Range Status  12/13/2021 0.76 0.57 - 1.00 mg/dL Final         Failed - K in normal range and within 180 days    Potassium  Date Value Ref Range Status  12/13/2021 3.9 3.5 - 5.2 mmol/L Final         Passed - Patient is not pregnant      Passed - Last BP in normal range    BP Readings from Last 1 Encounters:  07/13/22 112/69         Passed - Valid encounter within last 6 months    Recent Outpatient Visits           4 months ago Contusion of left shoulder, initial encounter   [place] [facility], Ocie Bob, MD   6 months ago Hyperhidrosis   [facility] at [facility], Ocie Bob, MD   9 months ago Hypertension, unspecified type   [facility] at [facility], Ocie Bob, MD   10 months ago Annual physical exam   [facility] at [facility], Ocie Bob, MD   11 months ago Hypertension, unspecified type   [facility] at [facility], Ocie Bob, MD       Future Appointments             In 1 month Deirdre Evener, MD [facility]   In 7 months Deirdre Evener, MD [facility]

## 2022-12-01 ENCOUNTER — Encounter: Payer: Self-pay | Admitting: Family Medicine

## 2022-12-01 ENCOUNTER — Other Ambulatory Visit: Payer: Self-pay | Admitting: Family Medicine

## 2022-12-01 DIAGNOSIS — R61 Generalized hyperhidrosis: Secondary | ICD-10-CM

## 2022-12-01 NOTE — Telephone Encounter (Signed)
Please advise 

## 2022-12-01 NOTE — Telephone Encounter (Signed)
Refill

## 2022-12-02 ENCOUNTER — Other Ambulatory Visit: Payer: Self-pay | Admitting: Family Medicine

## 2022-12-02 DIAGNOSIS — R61 Generalized hyperhidrosis: Secondary | ICD-10-CM

## 2022-12-03 ENCOUNTER — Other Ambulatory Visit: Payer: Self-pay | Admitting: Family Medicine

## 2022-12-03 DIAGNOSIS — R61 Generalized hyperhidrosis: Secondary | ICD-10-CM

## 2022-12-05 ENCOUNTER — Telehealth: Payer: Self-pay | Admitting: Family Medicine

## 2022-12-05 MED ORDER — GLYCOPYRROLATE 2 MG PO TABS
2.0000 mg | ORAL_TABLET | Freq: Two times a day (BID) | ORAL | 0 refills | Status: DC
Start: 2022-12-05 — End: 2023-02-27

## 2022-12-05 NOTE — Telephone Encounter (Signed)
Medication Refill - Medication: glycopyrrolate (ROBINUL) 2 MG tablet   Has the patient contacted their pharmacy? Yes.    Pharmacy sent refill request on 12/01/22 and sent request again on 12/02/22 and 12/03/22.  Pt is calling to check on refill request. Advised pt that it does take 3 business days for refill request. Pt did make an appt for 12/08/22 with PCP.   Preferred Pharmacy (with phone number or street name):  [facility] [address] [place] - [address] Phone: [phone]  Fax: [phone]     Has the patient been seen for an appointment in the last year OR does the patient have an upcoming appointment? Yes.    Agent: Please be advised that RX refills may take up to 3 business days. We ask that you follow-up with your pharmacy.

## 2022-12-05 NOTE — Telephone Encounter (Signed)
glycopyrrolate (ROBINUL) 2 MG tablet 180 tablet 0 12/05/2022    Sig - Route: Take 1 tablet (2 mg total) by mouth 2 (two) times daily. - Oral   Sent to pharmacy as: glycopyrrolate (ROBINUL) 2 MG tablet   E-Prescribing Status: Receipt confirmed by pharmacy (12/05/2022  8:42 AM EDT)    Requested Prescriptions  Refused Prescriptions Disp Refills   glycopyrrolate (ROBINUL) 2 MG tablet [Pharmacy Med Name: Glycopyrrolate 2 MG Oral Tablet] 180 tablet 0    Sig: Take 1 tablet by mouth twice daily     Gastroenterology:  Antispasmodic Agents Passed - 12/03/2022  2:43 PM      Passed - Valid encounter within last 12 months    Recent Outpatient Visits           4 months ago Contusion of left shoulder, initial encounter   [place] [facility], Ocie Bob, MD   6 months ago Hyperhidrosis   [facility] at [facility], Ocie Bob, MD   9 months ago Hypertension, unspecified type   [facility] & [facility] at [facility], Ocie Bob, MD   10 months ago Annual physical exam   [facility] at [facility], Ocie Bob, MD   12 months ago Hypertension, unspecified type   [facility] Care & Sports Medicine at [facility], Ocie Bob, MD       Future Appointments             In 3 days Ashley Royalty, Ocie Bob, MD [facility] & [facility] Medicine at [facility]   In 1 month Deirdre Evener, MD [facility]   In 7 months Deirdre Evener, MD [facility]

## 2022-12-05 NOTE — Telephone Encounter (Signed)
Medication sent to pharmacy on 12/05/22.

## 2022-12-06 ENCOUNTER — Other Ambulatory Visit: Payer: Self-pay | Admitting: Family Medicine

## 2022-12-08 ENCOUNTER — Ambulatory Visit: Payer: Medicaid Other | Admitting: Family Medicine

## 2023-01-19 ENCOUNTER — Ambulatory Visit (INDEPENDENT_AMBULATORY_CARE_PROVIDER_SITE_OTHER): Payer: Medicaid Other | Admitting: Dermatology

## 2023-01-19 VITALS — BP 131/79 | HR 68 | Wt 156.4 lb

## 2023-01-19 DIAGNOSIS — Z79899 Other long term (current) drug therapy: Secondary | ICD-10-CM

## 2023-01-19 DIAGNOSIS — Z808 Family history of malignant neoplasm of other organs or systems: Secondary | ICD-10-CM

## 2023-01-19 DIAGNOSIS — Z7189 Other specified counseling: Secondary | ICD-10-CM

## 2023-01-19 DIAGNOSIS — L650 Telogen effluvium: Secondary | ICD-10-CM | POA: Diagnosis not present

## 2023-01-19 DIAGNOSIS — Z86018 Personal history of other benign neoplasm: Secondary | ICD-10-CM | POA: Diagnosis not present

## 2023-01-19 DIAGNOSIS — L719 Rosacea, unspecified: Secondary | ICD-10-CM | POA: Diagnosis not present

## 2023-01-19 DIAGNOSIS — L739 Follicular disorder, unspecified: Secondary | ICD-10-CM | POA: Diagnosis not present

## 2023-01-19 DIAGNOSIS — L659 Nonscarring hair loss, unspecified: Secondary | ICD-10-CM

## 2023-01-19 MED ORDER — CLINDAMYCIN PHOSPHATE 1 % EX SOLN: CUTANEOUS | 5 refills | Status: DC

## 2023-01-19 NOTE — Progress Notes (Signed)
Follow-Up Visit   Subjective  Tina Hughes is a 32 y.o. female who presents for the following: recheck moderate to severely dysplastic nevus of the L middle medial scapula. Patient used the Ketoconazole shampoo for two months but didn't noticed an improvement and stopped it, because she started noticing hair loss. Patient has noticed hair loss in the past 6 mths. No recent illness, change in medications, changes in hormones, or stress.   The following portions of the chart were reviewed this encounter and updated as appropriate: medications, allergies, medical history  Review of Systems:  No other skin or systemic complaints except as noted in HPI or Assessment and Plan.  Objective  Well appearing patient in no apparent distress; mood and affect are within normal limits.   A focused examination was performed of the following areas: the face and back   Relevant exam findings are noted in the Assessment and Plan.    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  No evidence of recurrence today Recommend regular full body skin exams Recommend daily broad spectrum sunscreen SPF 30+ to sun-exposed areas, reapply every 2 hours as needed.  Call if any new or changing lesions are noted between office visits  Nonscarring hair loss  Related Procedures Thyroid Panel With TSH  Family history of skin cancer - what type(s):Melanoma, SCC - who affected: Grandmother (melanoma), Sister (SCC)  TELOGEN EFFLUVIUM Exam: Diffuse thinning of hair, positive hair pull test.  Telogen effluvium is a benign, self-limited condition causing increased hair shedding usually for several months. It does not progress to baldness, and the hair eventually grows back on its own. It can be triggered by recent illness, recent surgery, thyroid disease, low iron stores, vitamin D deficiency, fad diets or rapid weight loss, hormonal changes such as pregnancy or  birth control pills, and some medication. Usually the hair loss starts 2-3 months after the illness or health change. Rarely, it can continue for longer than a year. Treatments options may include oral or topical Minoxidil; Red Light scalp treatments; Biotin 2.5 mg daily and other options.  Will order thyroid testing. Pt to stop hair, skin, and nail vitamin for one week before having lab drawn.   Consider Minoxidil 2.5 mg 1/2 tab po QD pending thyroid results. No ankle swelling today.   Rosacea/pityrosporum folliculitis/folliculitis Scalp  Seborrheic Dermatitis  -  is a chronic persistent rash characterized by pinkness and scaling most commonly of the mid face but also can occur on the scalp (dandruff), ears; mid chest, mid back and groin.  It tends to be exacerbated by stress and cooler weather.  People who have neurologic disease may experience new onset or exacerbation of existing seborrheic dermatitis.  The condition is not curable but treatable and can be controlled.   Rosacea is a chronic progressive skin condition usually affecting the face of adults, causing redness and/or acne bumps. It is treatable but not curable. It sometimes affects the eyes (ocular rosacea) as well. It may respond to topical and/or systemic medication and can flare with stress, sun exposure, alcohol, exercise, topical steroids (including hydrocortisone/cortisone 10) and some foods.  Daily application of broad spectrum spf 30+ sunscreen to face is recommended to reduce flares.   Start Clindamycin solution to aa QD PRN.   May restart Ketoconazole 2% shampoo if she found it helpful in the past.  Return in about 7 months (around 08/21/2023) for TBSE hx dysplastic nevus .  Maylene Roes, CMA, am acting as scribe for Armida Sans, MD .  Documentation: I have reviewed the above documentation for accuracy and completeness, and I agree with the above.  Armida Sans, MD

## 2023-01-19 NOTE — Patient Instructions (Signed)
Due to recent changes in healthcare laws, you may see results of your pathology and/or laboratory studies on MyChart before the doctors have had a chance to review them. We understand that in some cases there may be results that are confusing or concerning to you. Please understand that not all results are received at the same time and often the doctors may need to interpret multiple results in order to provide you with the best plan of care or course of treatment. Therefore, we ask that you please give us 2 business days to thoroughly review all your results before contacting the office for clarification. Should we see a critical lab result, you will be contacted sooner.   If You Need Anything After Your Visit  If you have any questions or concerns for your doctor, please call our main line at [phone] and press option 4 to reach your doctor's medical assistant. If no one answers, please leave a voicemail as directed and we will return your call as soon as possible. Messages left after 4 pm will be answered the following business day.   You may also send us a message via MyChart. We typically respond to MyChart messages within 1-2 business days.  For prescription refills, please ask your pharmacy to contact our office. Our fax number is [phone].  If you have an urgent issue when the clinic is closed that cannot wait until the next business day, you can page your doctor at the number below.    Please note that while we do our best to be available for urgent issues outside of office hours, we are not available 24/7.   If you have an urgent issue and are unable to reach us, you may choose to seek medical care at your doctor's office, retail clinic, urgent care center, or emergency room.  If you have a medical emergency, please immediately call 911 or go to the emergency department.  Pager Numbers  - Dr. Kowalski: [phone]  - Dr. Moye: [phone]  - Dr. Stewart:  [phone]  In the event of inclement weather, please call our main line at [phone] for an update on the status of any delays or closures.  Dermatology Medication Tips: Please keep the boxes that topical medications come in in order to help keep track of the instructions about where and how to use these. Pharmacies typically print the medication instructions only on the boxes and not directly on the medication tubes.   If your medication is too expensive, please contact our office at [phone] option 4 or send us a message through MyChart.   We are unable to tell what your co-pay for medications will be in advance as this is different depending on your insurance coverage. However, we may be able to find a substitute medication at lower cost or fill out paperwork to get insurance to cover a needed medication.   If a prior authorization is required to get your medication covered by your insurance company, please allow us 1-2 business days to complete this process.  Drug prices often vary depending on where the prescription is filled and some pharmacies may offer cheaper prices.  The website www.goodrx.com contains coupons for medications through different pharmacies. The prices here do not account for what the cost may be with help from insurance (it may be cheaper with your insurance), but the website can give you the price if you did not use any insurance.  - You can print the associated coupon and take it with   your prescription to the pharmacy.  - You may also stop by our office during regular business hours and pick up a GoodRx coupon card.  - If you need your prescription sent electronically to a different pharmacy, notify our office through [facility] MyChart or by phone at [phone] option 4.     Si Usted Necesita Algo Despus de Su Visita  Tambin puede enviarnos un mensaje a travs de MyChart. Por lo general respondemos a los mensajes de MyChart en el transcurso de 1 a 2  das hbiles.  Para renovar recetas, por favor pida a su farmacia que se ponga en contacto con nuestra oficina. Nuestro nmero de fax es el [phone].  Si tiene un asunto urgente cuando la clnica est cerrada y que no puede esperar hasta el siguiente da hbil, puede llamar/localizar a su doctor(a) al nmero que aparece a continuacin.   Por favor, tenga en cuenta que aunque hacemos todo lo posible para estar disponibles para asuntos urgentes fuera del horario de oficina, no estamos disponibles las 24 horas del da, los 7 das de la semana.   Si tiene un problema urgente y no puede comunicarse con nosotros, puede optar por buscar atencin mdica  en el consultorio de su doctor(a), en una clnica privada, en un centro de atencin urgente o en una sala de emergencias.  Si tiene una emergencia mdica, por favor llame inmediatamente al 911 o vaya a la sala de emergencias.  Nmeros de bper  - Dr. Kowalski: [phone]  - Dra. Moye: [phone]  - Dra. Stewart: [phone]  En caso de inclemencias del tiempo, por favor llame a nuestra lnea principal al [phone] para una actualizacin sobre el estado de cualquier retraso o cierre.  Consejos para la medicacin en dermatologa: Por favor, guarde las cajas en las que vienen los medicamentos de uso tpico para ayudarle a seguir las instrucciones sobre dnde y cmo usarlos. Las farmacias generalmente imprimen las instrucciones del medicamento slo en las cajas y no directamente en los tubos del medicamento.   Si su medicamento es muy caro, por favor, pngase en contacto con nuestra oficina llamando al [phone] y presione la opcin 4 o envenos un mensaje a travs de MyChart.   No podemos decirle cul ser su copago por los medicamentos por adelantado ya que esto es diferente dependiendo de la cobertura de su seguro. Sin embargo, es posible que podamos encontrar un medicamento sustituto a menor costo o llenar un formulario para que el  seguro cubra el medicamento que se considera necesario.   Si se requiere una autorizacin previa para que su compaa de seguros cubra su medicamento, por favor permtanos de 1 a 2 das hbiles para completar este proceso.  Los precios de los medicamentos varan con frecuencia dependiendo del lugar de dnde se surte la receta y alguna farmacias pueden ofrecer precios ms baratos.  El sitio web www.goodrx.com tiene cupones para medicamentos de diferentes farmacias. Los precios aqu no tienen en cuenta lo que podra costar con la ayuda del seguro (puede ser ms barato con su seguro), pero el sitio web puede darle el precio si no utiliz ningn seguro.  - Puede imprimir el cupn correspondiente y llevarlo con su receta a la farmacia.  - Tambin puede pasar por nuestra oficina durante el horario de atencin regular y recoger una tarjeta de cupones de GoodRx.  - Si necesita que su receta se enve electrnicamente a una farmacia diferente, informe a nuestra oficina a travs de MyChart de [place]   o por telfono llamando al [phone] y presione la opcin 4.  

## 2023-01-20 ENCOUNTER — Ambulatory Visit (INDEPENDENT_AMBULATORY_CARE_PROVIDER_SITE_OTHER): Payer: Medicaid Other | Admitting: Family Medicine

## 2023-01-20 ENCOUNTER — Other Ambulatory Visit: Payer: Self-pay | Admitting: Family Medicine

## 2023-01-20 VITALS — BP 120/80 | HR 88 | Ht 66.0 in | Wt 155.0 lb

## 2023-01-20 DIAGNOSIS — R1013 Epigastric pain: Secondary | ICD-10-CM | POA: Diagnosis not present

## 2023-01-20 MED ORDER — PANTOPRAZOLE SODIUM 20 MG PO TBEC
20.0000 mg | DELAYED_RELEASE_TABLET | Freq: Two times a day (BID) | ORAL | 1 refills | Status: DC | PRN
Start: 2023-01-20 — End: 2023-05-31

## 2023-01-20 NOTE — Progress Notes (Signed)
     Primary Care / Sports Medicine Office Visit  Patient Information:  Patient ID: Tina Hughes, female DOB: 1991/03/04 Age: 32 y.o. MRN: [id number]   Tina Hughes is a pleasant 32 y.o. female presenting with the following:  Chief Complaint  Patient presents with   Abdominal Pain    3 days, feels like GERD, bid ribs through to back    Vitals:   01/20/23 1607  BP: 120/80  Pulse: 88  SpO2: 100%   Vitals:   01/20/23 1607  Weight: 155 lb (70.3 kg)  Height: 5\' 6"  (1.676 m)   Body mass index is 25.02 kg/m.  No results found.   Independent interpretation of notes and tests performed by another provider:   None  Procedures performed:   None  Pertinent History, Exam, Impression, and Recommendations:   [name] was seen today for abdominal pain.  Epigastric pain Assessment & Plan: Acute on chronic, previously evaluated by GI in 2022 with CT abd with contrast. Workup at that time reassuring. Noted 2-3 day history of postprandial epigastric pain with radiation to her back, no bowel changes from baseline.  Abdominal exam with normoactive BS, soft, NT/ND, no hepatosplenomegaly, -Murphy's.  Plan: - Obtain labs - PPI PRN - Lifestyle changes - Consider low fodmap for alternating baseline diarrhea constipation & probiotic  Orders: -     Comprehensive metabolic panel -     Lipase -     Bilirubin, fractionated(tot/dir/indir) -     Amylase -     Pantoprazole Sodium; Take 1 tablet (20 mg total) by mouth 2 (two) times daily as needed.  Dispense: 30 tablet; Refill: 1     Orders & Medications Meds ordered this encounter  Medications   pantoprazole (PROTONIX) 20 MG tablet    Sig: Take 1 tablet (20 mg total) by mouth 2 (two) times daily as needed.    Dispense:  30 tablet    Refill:  1   Orders Placed This Encounter  Procedures   Comprehensive metabolic panel   Lipase   Bilirubin, fractionated (tot/dir/indir)   Amylase     No follow-ups on file.     Jerrol Banana, MD, [facility]   Primary Care Sports Medicine Primary Care and Sports Medicine at [facility]

## 2023-01-20 NOTE — Assessment & Plan Note (Signed)
Acute on chronic, previously evaluated by GI in 2022 with CT abd with contrast. Workup at that time reassuring. Noted 2-3 day history of postprandial epigastric pain with radiation to her back, no bowel changes from baseline.  Abdominal exam with normoactive BS, soft, NT/ND, no hepatosplenomegaly, -Murphy's.  Plan: - Obtain labs - PPI PRN - Lifestyle changes - Consider low fodmap for alternating baseline diarrhea constipation & probiotic

## 2023-01-20 NOTE — Patient Instructions (Addendum)
-   Obtain labs - Review information attached and make changes where appropriate - Can use pantoprazole (reflux medication) as-needed - Consider probiotic - Return for physical

## 2023-01-23 LAB — COMPREHENSIVE METABOLIC PANEL
ALT: 11 IU/L (ref 0–32)
AST: 18 IU/L (ref 0–40)
Albumin/Globulin Ratio: 1.5 (ref 1.2–2.2)
Albumin: 4.6 g/dL (ref 3.9–4.9)
Alkaline Phosphatase: 66 IU/L (ref 44–121)
BUN/Creatinine Ratio: 14 (ref 9–23)
BUN: 12 mg/dL (ref 6–20)
Bilirubin Total: 0.3 mg/dL (ref 0.0–1.2)
CO2: 22 mmol/L (ref 20–29)
Calcium: 9.1 mg/dL (ref 8.7–10.2)
Chloride: 104 mmol/L (ref 96–106)
Creatinine, Ser: 0.87 mg/dL (ref 0.57–1.00)
Globulin, Total: 3 g/dL (ref 1.5–4.5)
Glucose: 81 mg/dL (ref 70–99)
Potassium: 4.2 mmol/L (ref 3.5–5.2)
Sodium: 136 mmol/L (ref 134–144)
Total Protein: 7.6 g/dL (ref 6.0–8.5)
eGFR: 90 mL/min/{1.73_m2} (ref >59)

## 2023-01-23 LAB — BILIRUBIN, FRACTIONATED(TOT/DIR/INDIR)
Bilirubin, Direct: 0.12 mg/dL (ref 0.00–0.40)
Bilirubin, Indirect: 0.18 mg/dL (ref 0.10–0.80)

## 2023-01-23 LAB — LIPASE: Lipase: 26 U/L (ref 14–72)

## 2023-01-23 LAB — AMYLASE: Amylase: 71 U/L (ref 31–110)

## 2023-01-31 ENCOUNTER — Other Ambulatory Visit: Payer: Self-pay | Admitting: Family Medicine

## 2023-01-31 DIAGNOSIS — R61 Generalized hyperhidrosis: Secondary | ICD-10-CM

## 2023-02-01 ENCOUNTER — Encounter: Payer: Self-pay | Admitting: Dermatology

## 2023-02-17 ENCOUNTER — Ambulatory Visit: Payer: Medicaid Other | Admitting: Family Medicine

## 2023-02-26 ENCOUNTER — Other Ambulatory Visit: Payer: Self-pay | Admitting: Family Medicine

## 2023-02-26 DIAGNOSIS — R61 Generalized hyperhidrosis: Secondary | ICD-10-CM

## 2023-02-27 ENCOUNTER — Other Ambulatory Visit: Payer: Self-pay | Admitting: Family Medicine

## 2023-02-27 DIAGNOSIS — I10 Essential (primary) hypertension: Secondary | ICD-10-CM

## 2023-02-27 NOTE — Telephone Encounter (Signed)
Requested Prescriptions  Pending Prescriptions Disp Refills   lisinopril (ZESTRIL) 10 MG tablet [Pharmacy Med Name: Lisinopril 10 MG Oral Tablet] 90 tablet 0    Sig: Take 1 tablet by mouth once daily     Cardiovascular:  ACE Inhibitors Passed - 02/27/2023  9:12 AM      Passed - Cr in normal range and within 180 days    Creatinine, Ser  Date Value Ref Range Status  01/20/2023 0.87 0.57 - 1.00 mg/dL Final         Passed - K in normal range and within 180 days    Potassium  Date Value Ref Range Status  01/20/2023 4.2 3.5 - 5.2 mmol/L Final         Passed - Patient is not pregnant      Passed - Last BP in normal range    BP Readings from Last 1 Encounters:  01/20/23 120/80         Passed - Valid encounter within last 6 months    Recent Outpatient Visits           1 month ago Epigastric pain   [place] [facility], Ocie Bob, MD   7 months ago Contusion of left shoulder, initial encounter   [facility] at [facility], Ocie Bob, MD   9 months ago Hyperhidrosis   [facility], Ocie Bob, MD   12 months ago Hypertension, unspecified type   [facility], Ocie Bob, MD   1 year ago Annual physical exam   [facility] at [facility], Ocie Bob, MD       Future Appointments             In 4 months Deirdre Evener, MD [facility]

## 2023-02-27 NOTE — Telephone Encounter (Signed)
Requested Prescriptions  Pending Prescriptions Disp Refills   glycopyrrolate (ROBINUL) 2 MG tablet [Pharmacy Med Name: Glycopyrrolate 2 MG Oral Tablet] 180 tablet 0    Sig: Take 1 tablet by mouth twice daily     Gastroenterology:  Antispasmodic Agents Passed - 02/26/2023 11:22 AM      Passed - Valid encounter within last 12 months    Recent Outpatient Visits           1 month ago Epigastric pain   [place] [facility], Ocie Bob, MD   7 months ago Contusion of left shoulder, initial encounter   [facility], Ocie Bob, MD   9 months ago Hyperhidrosis   [facility] at [facility], Ocie Bob, MD   12 months ago Hypertension, unspecified type   [facility] at [facility], Ocie Bob, MD   1 year ago Annual physical exam   [facility] Medicine at [facility], Ocie Bob, MD       Future Appointments             In 4 months Deirdre Evener, MD [facility] [facility]

## 2023-03-22 ENCOUNTER — Ambulatory Visit
Admission: EM | Admit: 2023-03-22 | Discharge: 2023-03-22 | Disposition: A | Discharge status: Home / Self Care | Payer: Medicaid Other | Attending: Emergency Medicine | Admitting: Emergency Medicine

## 2023-03-22 DIAGNOSIS — R300 Dysuria: Secondary | ICD-10-CM | POA: Diagnosis present

## 2023-03-22 DIAGNOSIS — Z87891 Personal history of nicotine dependence: Secondary | ICD-10-CM | POA: Insufficient documentation

## 2023-03-22 DIAGNOSIS — I10 Essential (primary) hypertension: Secondary | ICD-10-CM | POA: Diagnosis not present

## 2023-03-22 DIAGNOSIS — N390 Urinary tract infection, site not specified: Secondary | ICD-10-CM | POA: Insufficient documentation

## 2023-03-22 LAB — URINALYSIS, W/ REFLEX TO CULTURE (INFECTION SUSPECTED): WBC, UA: >50 WBC/hpf (ref 0–5)

## 2023-03-22 MED ORDER — CEFDINIR 300 MG PO CAPS: 300.0000 mg | ORAL_CAPSULE | Freq: Two times a day (BID) | ORAL | 0 refills | Status: AC

## 2023-03-22 MED ORDER — PHENAZOPYRIDINE HCL 200 MG PO TABS: 200.0000 mg | ORAL_TABLET | Freq: Three times a day (TID) | ORAL | 0 refills | Status: DC

## 2023-03-22 NOTE — ED Provider Notes (Signed)
[facility]    CSN: [id number] Arrival date & time: 03/22/23  0907      History   Chief Complaint Chief Complaint  Patient presents with   Dysuria    HPI Tina Hughes is a 32 y.o. female.   HPI  32 year old female with past medical history of hypertension and anxiety presents for evaluation of UTI symptoms that began 5 days ago.  She reports that she took AZO for 2 days and her symptoms resolved.  Initially she had a fever of 102.2 with the first round of symptoms.  She reports that the UTI symptoms returned this morning.  They consist of burning with urination along with urinary urgency and frequency.  She did take Azo this morning.  She has had some nausea and diarrhea but she denies any vomiting, back pain, or blood in her urine.  No fever at present.  No abdominal pain.  Past Medical History:  Diagnosis Date   Anxiety    Dysplastic nevus 07/25/2022   left middle medial scapula, mod to severe   Hypertension     Patient Active Problem List   Diagnosis Date Noted   Epigastric pain 01/20/2023   Contusion of left shoulder 07/12/2022   Hyperhidrosis 05/31/2022   Annual physical exam 01/10/2022   Hypertension 12/10/2021   History of gestational hypertension 10/11/2017   Anxiety 03/31/2017    Past Surgical History:  Procedure Laterality Date   TUBAL LIGATION Bilateral 02/22/2020   Procedure: POST PARTUM TUBAL LIGATION;  Surgeon: Hildred Laser, MD;  Location: [facility];  Service: Gynecology;  Laterality: Bilateral;    OB History     Gravida  3   Para  3   Term  3   Preterm      AB      Living  3      SAB      IAB      Ectopic      Multiple  0   Live Births  3            Home Medications    Prior to Admission medications   Medication Sig Start Date End Date Taking? Authorizing Provider  cefdinir (OMNICEF) 300 MG capsule Take 1 capsule (300 mg total) by mouth 2 (two) times daily for 5 days. 03/22/23 03/27/23 Yes Becky Augusta, NP   glycopyrrolate (ROBINUL) 2 MG tablet Take 1 tablet by mouth twice daily 02/27/23  Yes Jerrol Banana, MD  ketoconazole (NIZORAL) 2 % shampoo Apply topically. 01/30/23  Yes [provider]  lisinopril (ZESTRIL) 10 MG tablet Take 1 tablet by mouth once daily 02/27/23  Yes Jerrol Banana, MD  pantoprazole (PROTONIX) 20 MG tablet Take 1 tablet (20 mg total) by mouth 2 (two) times daily as needed. 01/20/23  Yes Jerrol Banana, MD  phenazopyridine (PYRIDIUM) 200 MG tablet Take 1 tablet (200 mg total) by mouth 3 (three) times daily. 03/22/23  Yes Becky Augusta, NP    Family History Family History  Problem Relation Age of Onset   Irritable bowel syndrome Mother    Other Sister        Liddle's syndrome   Hypertension Sister    Pancreatitis Sister    Cancer Paternal Grandmother        skin cancer   Breast cancer Neg Hx    Ovarian cancer Neg Hx    Colon cancer Neg Hx     Social History Social History   Tobacco Use  Smoking status: Former    Current packs/day: 0.00    Types: Cigarettes    Quit date: 10/2021    Years since quitting: 1.4   Smokeless tobacco: Never  Vaping Use   Vaping status: Never Used  Substance Use Topics   Alcohol use: Yes    Comment: occasional   Drug use: No     Allergies   Patient has no known allergies.   Review of Systems Review of Systems  Constitutional:  Positive for fever.  Gastrointestinal:  Positive for diarrhea and nausea. Negative for abdominal pain and vomiting.  Genitourinary:  Positive for dysuria, frequency and urgency. Negative for hematuria.  Musculoskeletal:  Negative for back pain.     Physical Exam Triage Vital Signs ED Triage Vitals [03/22/23 0911]  Encounter Vitals Group     BP      Systolic BP Percentile      Diastolic BP Percentile      Pulse      Resp 16     Temp      Temp Source Oral     SpO2      Weight      Height      Head Circumference      Peak Flow      Pain Score      Pain Loc      Pain  Education      Exclude from Growth Chart    No data found.  Updated Vital Signs BP 137/81 (BP Location: Right Arm)   Pulse 80   Temp 98.8 F (37.1 C) (Oral)   Resp 16   Ht 5\' 6"  (1.676 m)   Wt 155 lb (70.3 kg)   SpO2 98%   BMI 25.02 kg/m   Visual Acuity Right Eye Distance:   Left Eye Distance:   Bilateral Distance:    Right Eye Near:   Left Eye Near:    Bilateral Near:     Physical Exam Vitals and nursing note reviewed.  Constitutional:      Appearance: Normal appearance. She is not ill-appearing.  HENT:     Head: Normocephalic and atraumatic.  Cardiovascular:     Rate and Rhythm: Normal rate and regular rhythm.     Pulses: Normal pulses.     Heart sounds: Normal heart sounds. No murmur heard.    No friction rub. No gallop.  Pulmonary:     Effort: Pulmonary effort is normal.     Breath sounds: Normal breath sounds. No wheezing, rhonchi or rales.  Abdominal:     General: Abdomen is flat.     Palpations: Abdomen is soft.     Tenderness: There is no abdominal tenderness. There is no right CVA tenderness or left CVA tenderness.  Skin:    General: Skin is warm and dry.     Capillary Refill: Capillary refill takes less than 2 seconds.  Neurological:     General: No focal deficit present.     Mental Status: She is alert and oriented to person, place, and time.      UC Treatments / Results  Labs (all labs ordered are listed, but only abnormal results are displayed) Labs Reviewed  URINALYSIS, W/ REFLEX TO CULTURE (INFECTION SUSPECTED) - Abnormal; Notable for the following components:      Result Value   Color, Urine ORANGE (*)    APPearance HAZY (*)    Glucose, UA   (*)    Value: TEST NOT REPORTED DUE TO COLOR INTERFERENCE  OF URINE PIGMENT   Hgb urine dipstick   (*)    Value: TEST NOT REPORTED DUE TO COLOR INTERFERENCE OF URINE PIGMENT   Bilirubin Urine   (*)    Value: TEST NOT REPORTED DUE TO COLOR INTERFERENCE OF URINE PIGMENT   Ketones, ur   (*)     Value: TEST NOT REPORTED DUE TO COLOR INTERFERENCE OF URINE PIGMENT   Protein, ur   (*)    Value: TEST NOT REPORTED DUE TO COLOR INTERFERENCE OF URINE PIGMENT   Nitrite   (*)    Value: TEST NOT REPORTED DUE TO COLOR INTERFERENCE OF URINE PIGMENT   Leukocytes,Ua   (*)    Value: TEST NOT REPORTED DUE TO COLOR INTERFERENCE OF URINE PIGMENT   Bacteria, UA MANY (*)    All other components within normal limits  URINE CULTURE    EKG   Radiology No results found.  Procedures Procedures (including critical care time)  Medications Ordered in UC Medications - No data to display  Initial Impression / Assessment and Plan / UC Course  I have reviewed the triage vital signs and the nursing notes.  Pertinent labs & imaging results that were available during my care of the patient were reviewed by me and considered in my medical decision making (see chart for details).   Patient is a nontoxic-appearing 32 year old female presenting for evaluation of 5 days with UTI symptoms as outlined in HPI above.  She reports that when she started develop symptoms that she had an associated fever with a Tmax of 102.2.  She began her UTI symptoms in conjunction with her menstrual cycle.  Her UTI symptoms resolved after 2 days of over-the-counter Azo but then returned this morning.  On exam she is not in any acute distress.  She has no CVA tenderness on exam and her abdomen is soft, flat, and nontender.  She is afebrile.  She denies any vaginal itching or discharge.  I will order a urinalysis to evaluate for the presence of UTI.  Urinalysis has colorimetric interference from the Azo though it does have a hazy appearance.  Reflex microscopy shows a large number of WBCs with moderate RBCs, many bacteria, and WBC clumps.  Urine will reflex to culture.  I will discharge patient home with a diagnosis of UTI and start her on cefdinir 300 mg twice daily for 5 days along with Pyridium every 8 hours as needed for urinary  discomfort.  She should increase her oral fluid intake such increases urine production helps to flush her urinary tract.  Return precautions reviewed.  Work note provided.  Final Clinical Impressions(s) / UC Diagnoses   Final diagnoses:  Lower urinary tract infectious disease     Discharge Instructions      Take the Cefdinir twice daily for 5 days with food for treatment of urinary tract infection.  Use the Pyridium every 8 hours as needed for urinary discomfort.  This will turn your urine a bright red-orange.  Increase your oral fluid intake so that you increase your urine production and or flushing your urinary system.  Take an over-the-counter probiotic, such as Culturelle-Align-Activia, 1 hour after each dose of antibiotic to prevent diarrhea or yeast infections from forming.  We will culture urine and change the antibiotics if necessary.  Return for reevaluation, or see your primary care provider, for any new or worsening symptoms.      ED Prescriptions     Medication Sig Dispense Auth. Provider   cefdinir (  OMNICEF) 300 MG capsule Take 1 capsule (300 mg total) by mouth 2 (two) times daily for 5 days. 10 capsule Becky Augusta, NP   phenazopyridine (PYRIDIUM) 200 MG tablet Take 1 tablet (200 mg total) by mouth 3 (three) times daily. 6 tablet Becky Augusta, NP      PDMP not reviewed this encounter.   Becky Augusta, NP 03/22/23 [phone]

## 2023-03-22 NOTE — Discharge Instructions (Signed)
Take the Cefdinir twice daily for 5 days with food for treatment of urinary tract infection.  Use the Pyridium every 8 hours as needed for urinary discomfort.  This will turn your urine a bright red-orange.  Increase your oral fluid intake so that you increase your urine production and or flushing your urinary system.  Take an over-the-counter probiotic, such as Culturelle-Align-Activia, 1 hour after each dose of antibiotic to prevent diarrhea or yeast infections from forming.  We will culture urine and change the antibiotics if necessary.  Return for reevaluation, or see your primary care provider, for any new or worsening symptoms.

## 2023-03-22 NOTE — ED Triage Notes (Signed)
Pt c/o urinary freq,burning & pain x5 days. Denies any hematuria. Has tried AZO w/o relief.

## 2023-04-24 ENCOUNTER — Ambulatory Visit
Admission: EM | Admit: 2023-04-24 | Discharge: 2023-04-24 | Disposition: A | Discharge status: Home / Self Care | Payer: Medicaid Other | Attending: Family Medicine | Admitting: Family Medicine

## 2023-04-24 ENCOUNTER — Encounter: Payer: Self-pay | Admitting: Emergency Medicine

## 2023-04-24 DIAGNOSIS — S0501XA Injury of conjunctiva and corneal abrasion without foreign body, right eye, initial encounter: Secondary | ICD-10-CM

## 2023-04-24 DIAGNOSIS — S0591XA Unspecified injury of right eye and orbit, initial encounter: Secondary | ICD-10-CM | POA: Diagnosis not present

## 2023-04-24 MED ORDER — ERYTHROMYCIN 5 MG/GM OP OINT: TOPICAL_OINTMENT | Freq: Three times a day (TID) | OPHTHALMIC | Status: DC

## 2023-04-24 MED ORDER — EYE WASH OP SOLN
1.0000 [drp] | OPHTHALMIC | Status: DC | PRN
  Administered 2023-04-24: 1 [drp] via OPHTHALMIC

## 2023-04-24 NOTE — ED Provider Notes (Addendum)
[facility]    CSN: [id number] Arrival date & time: 04/24/23  1805      History   Chief Complaint Chief Complaint  Patient presents with   Eye Injury    HPI HPI  Tina Hughes is a 32 y.o. female.    Blessin presents for right eye pain that started at 12 PM after walking into a tree branch. Pain worse with eye movement and looking at bright lights. Took 800 mg ibuprofen and rinsed the eye with [facility] eye wash.  Has some right eye blurry vision. No double vision.  However, eye pain remains.   Liyat does not wear glasses or contacts.   Asami has otherwise been well and has no additional concerns today.     Past Medical History:  Diagnosis Date   Anxiety    Dysplastic nevus 07/25/2022   left middle medial scapula, mod to severe   Hypertension     Patient Active Problem List   Diagnosis Date Noted   Epigastric pain 01/20/2023   Contusion of left shoulder 07/12/2022   Hyperhidrosis 05/31/2022   Annual physical exam 01/10/2022   Hypertension 12/10/2021   History of gestational hypertension 10/11/2017   Anxiety 03/31/2017    Past Surgical History:  Procedure Laterality Date   TUBAL LIGATION Bilateral 02/22/2020   Procedure: POST PARTUM TUBAL LIGATION;  Surgeon: Hildred Laser, MD;  Location: [facility];  Service: Gynecology;  Laterality: Bilateral;    OB History     Gravida  3   Para  3   Term  3   Preterm      AB      Living  3      SAB      IAB      Ectopic      Multiple  0   Live Births  3            Home Medications    Prior to Admission medications   Medication Sig Start Date End Date Taking? Authorizing Provider  glycopyrrolate (ROBINUL) 2 MG tablet Take 1 tablet by mouth twice daily 02/27/23   Jerrol Banana, MD  ketoconazole (NIZORAL) 2 % shampoo Apply topically. 01/30/23   [provider]  lisinopril (ZESTRIL) 10 MG tablet Take 1 tablet by mouth once daily 02/27/23   Jerrol Banana, MD  pantoprazole  (PROTONIX) 20 MG tablet Take 1 tablet (20 mg total) by mouth 2 (two) times daily as needed. 01/20/23   Jerrol Banana, MD  phenazopyridine (PYRIDIUM) 200 MG tablet Take 1 tablet (200 mg total) by mouth 3 (three) times daily. 03/22/23   Becky Augusta, NP    Family History Family History  Problem Relation Age of Onset   Irritable bowel syndrome Mother    Other Sister        Liddle's syndrome   Hypertension Sister    Pancreatitis Sister    Cancer Paternal Grandmother        skin cancer   Breast cancer Neg Hx    Ovarian cancer Neg Hx    Colon cancer Neg Hx     Social History Social History   Tobacco Use   Smoking status: Former    Current packs/day: 0.00    Types: Cigarettes    Quit date: 10/2021    Years since quitting: 1.5   Smokeless tobacco: Never  Vaping Use   Vaping status: Never Used  Substance Use Topics   Alcohol use: Yes    Comment:  occasional   Drug use: No     Allergies   Patient has no known allergies.   Review of Systems Review of Systems : negative unless otherwise stated in HPI.      Physical Exam Triage Vital Signs ED Triage Vitals  Encounter Vitals Group     BP 04/24/23 1832 125/75     Systolic BP Percentile --      Diastolic BP Percentile --      Pulse Rate 04/24/23 1832 73     Resp --      Temp 04/24/23 1832 99.1 F (37.3 C)     Temp Source 04/24/23 1832 Oral     SpO2 04/24/23 1832 100 %     Weight --      Height --      Head Circumference --      Peak Flow --      Pain Score 04/24/23 1830 6     Pain Loc --      Pain Education --      Exclude from Growth Chart --    No data found.  Updated Vital Signs BP 125/75 (BP Location: Left Arm)   Pulse 73   Temp 99.1 F (37.3 C) (Oral)   LMP 04/10/2023   SpO2 100%   Visual Acuity Right Eye Distance: 20/50 Left Eye Distance: 20/25 Bilateral Distance: 20/20  Right Eye Near:   Left Eye Near:    Bilateral Near:     Physical Exam  GEN: pleasant well appearing female, in no  acute distress  NECK: normal ROM  RESP: no increased work of breathing, EYES:     General: right upper and lower lid edema with erythema, Lids are everted but no foreign body. Vision grossly intact. Gaze aligned appropriately.        Right eye: Tearing, no hordeolum or foreign body     Left eye: No foreign body, discharge or hordeolum.     Extraocular Movements: Extraocular movements intact.     PERRLA    Conjunctiva/sclera:     Right eye: conjunctiva is injected. No chemosis or hemorrhage. No hyphema.     Comments: fluorescein stain performed, Corneal abrasions in the 2-4 o'clock position of her iris, saline rinsed and inspected for foreign bodies  SKIN: warm and dry   UC Treatments / Results  Labs (all labs ordered are listed, but only abnormal results are displayed) Labs Reviewed - No data to display  EKG   Radiology No results found.  Procedures Procedures (including critical care time)  Medications Ordered in UC Medications  eye wash ((SODIUM/POTASSIUM/SOD CHLORIDE)) ophthalmic solution 1 drop (1 drop Right Eye Given 04/24/23 1929)  erythromycin ophthalmic ointment ( Right Eye Given 04/24/23 1929)    Initial Impression / Assessment and Plan / UC Course  I have reviewed the triage vital signs and the nursing notes.  Pertinent labs & imaging results that were available during my care of the patient were reviewed by me and considered in my medical decision making (see chart for details).     Patient is a 32 y.o. female who presents after right eye pain with tearing discharge for the past few after walking into a tree branch.  She has decreased vision on the right compared to the left. On exam, she has a evidence of right corneal abrasion and lids edema with erythema. No hyphema or pain with eye movements.  PERRLA. Considered discussing with ophthalmologist but as there is no foreign body or hyphema  therefore will treat with erythromycin ointment. Antibiotic ointment which was  applied here as the pharmacies are closed for the holiday.  Suspect blurry vision 2/2 to recent eye trauma.   Advised to follow-up with an ophthalmologist, if  discomfort/pain is not improving after 7day course or is worsening. Recommended pt pick up eye patch from the pharmacy, if desired. Understanding voiced.   Discussed MDM, treatment plan and plan for follow-up with patient who agrees with plan.     Final Clinical Impressions(s) / UC Diagnoses   Final diagnoses:  Abrasion of right cornea, initial encounter  Right eye injury, initial encounter     Discharge Instructions      Use the erythromycin ointment three times a day for 7 days.    Follow up with your primary eyecare provider or [facility] if symptoms suddenly worsen or you have little improvement in your eye symptoms. Consider and eye patch for comfort.        ED Prescriptions   None    PDMP not reviewed this encounter.        Katha Cabal, DO 04/24/23 [phone]

## 2023-04-24 NOTE — Discharge Instructions (Addendum)
Use the erythromycin ointment three times a day for 7 days.    Follow up with your primary eyecare provider or [facility] if symptoms suddenly worsen or you have little improvement in your eye symptoms. Consider and eye patch for comfort.

## 2023-04-24 NOTE — ED Triage Notes (Signed)
Pt c/o eye injury to right eye this afternoon around 12 she mistakenly ran into a tree branch and poked herself if the eye. She has flushed it twice with OTC eye wash. She states it is painful and very watery.

## 2023-04-29 IMAGING — CT CT ABD-PELV W/ CM
2 of 4 series · 16 of 46 positions shown, 18 images · IV contrast (omnipaque)
Comparison: None.

CLINICAL DATA: Abdominal pain

EXAM:
CT ABDOMEN AND PELVIS WITH CONTRAST
TECHNIQUE: Multidetector CT imaging of the abdomen and pelvis was performed
using the standard protocol following bolus administration of
intravenous contrast.
CONTRAST:  80mL OMNIPAQUE IOHEXOL 300 MG/ML  SOLN

[Series 2: abd pelvis 5.00 · axial · 0.74mm/px · z∈[-1495,-1105]mm · 13 of 86 slices shown, 15 images]
[im 4/86  soft-tissue]
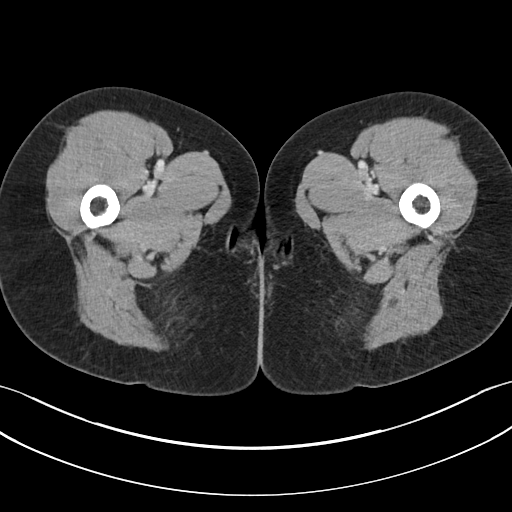
[im 4/86  bone]
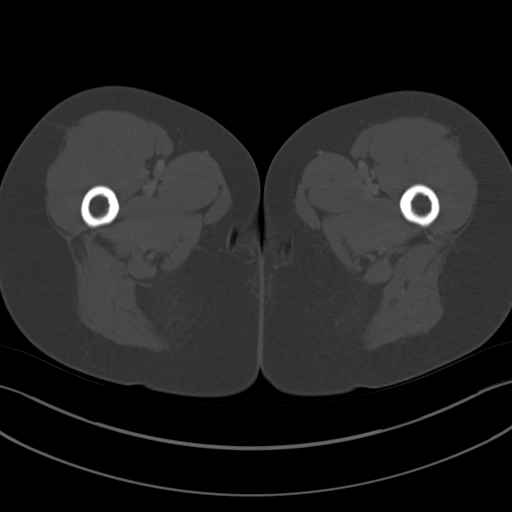
[im 11/86  soft-tissue]
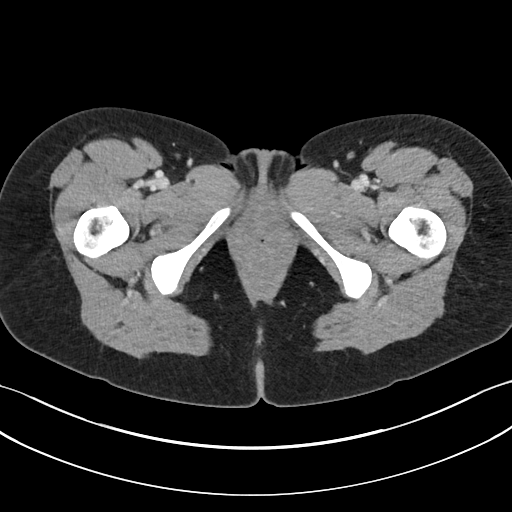
[im 18/86  soft-tissue]
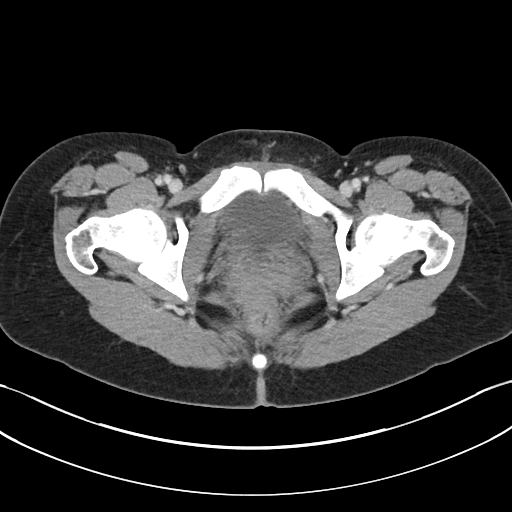
[im 25/86  soft-tissue]
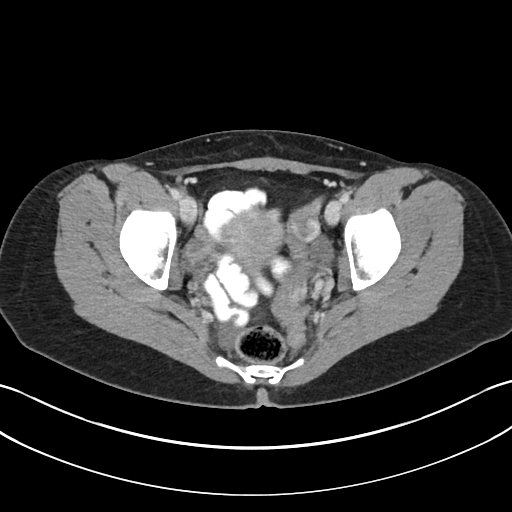
[im 29/86  soft-tissue]
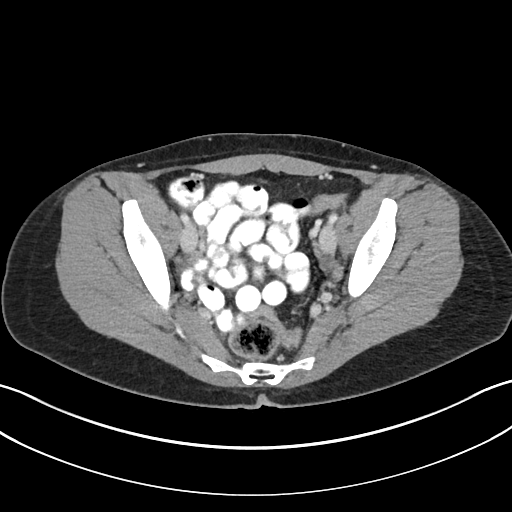
[im 36/86  soft-tissue]
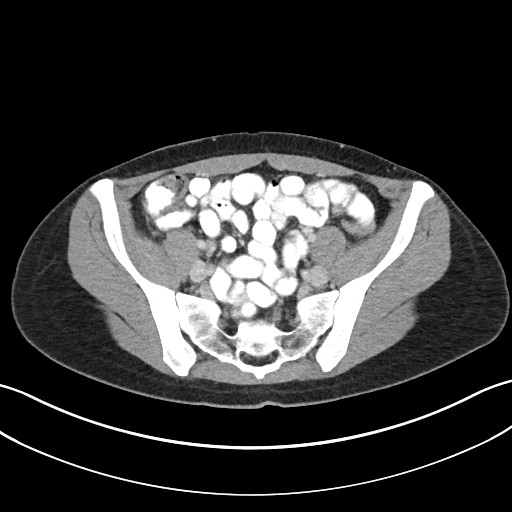
[im 43/86  soft-tissue]
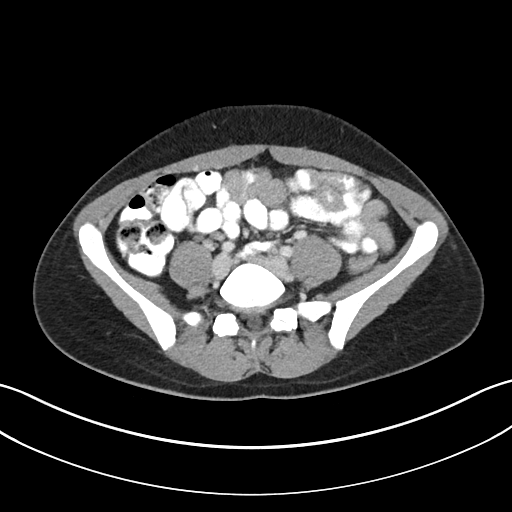
[im 50/86  soft-tissue]
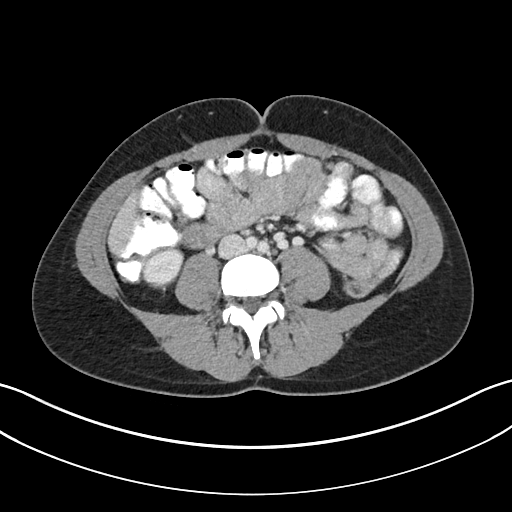
[im 57/86  soft-tissue]
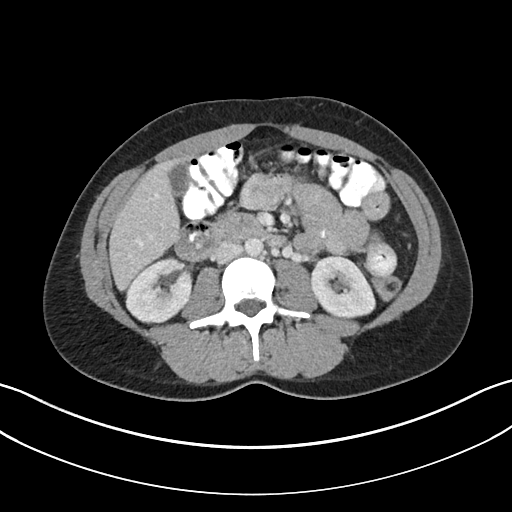
[im 57/86  bone]
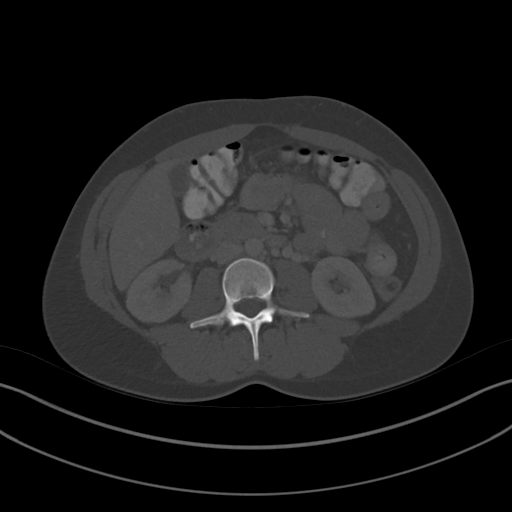
[im 61/86  soft-tissue]
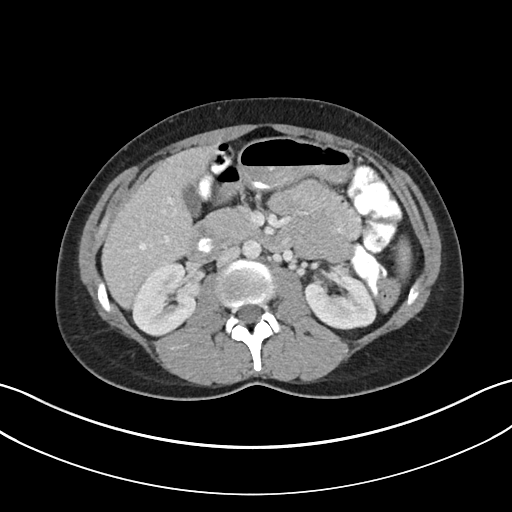
[im 68/86  soft-tissue]
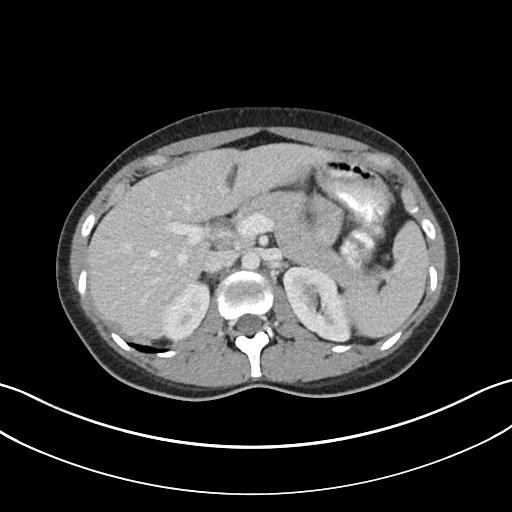
[im 75/86  soft-tissue]
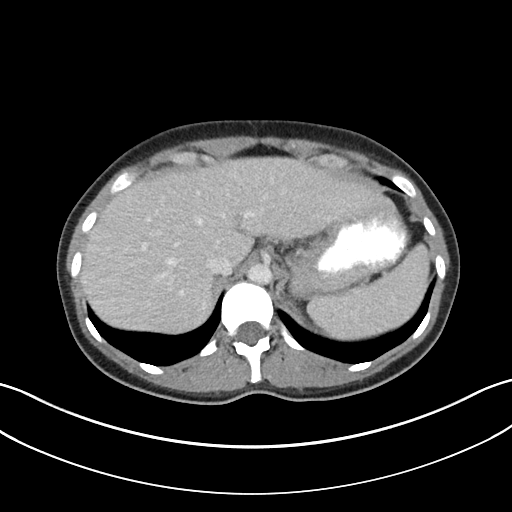
[im 82/86  soft-tissue]
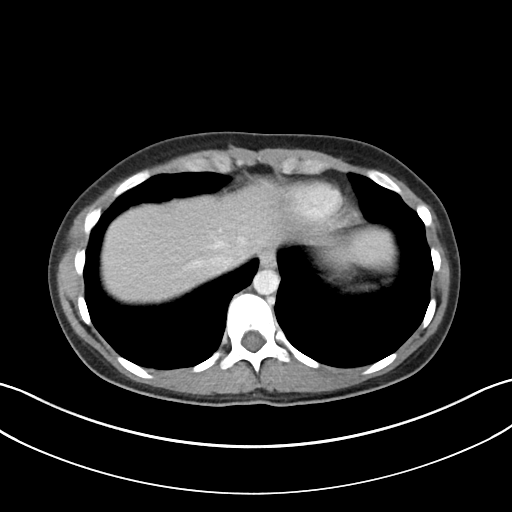

[Series 4: coronals abd pelvis 2.00 cor · coronal · 0.74mm/px · 3 of 183 slices shown]
[im 61/183  soft-tissue]
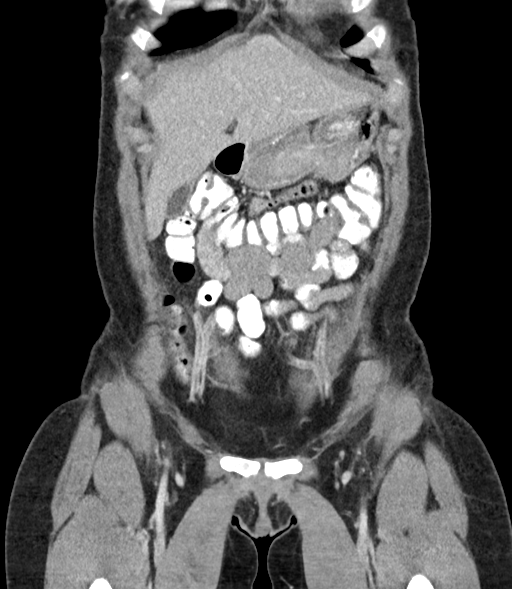
[im 81/183  soft-tissue]
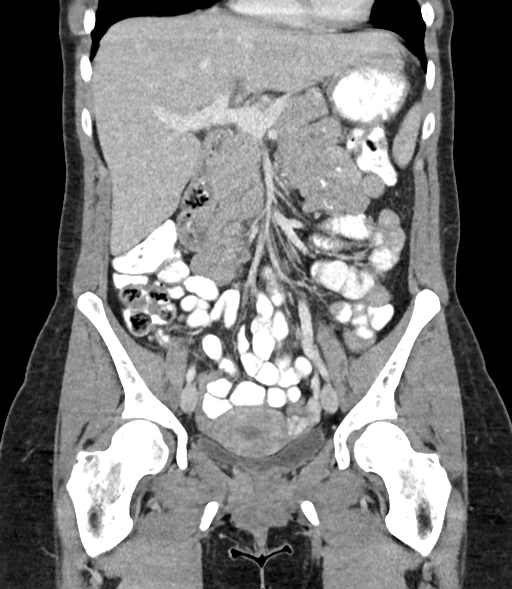
[im 102/183  soft-tissue]
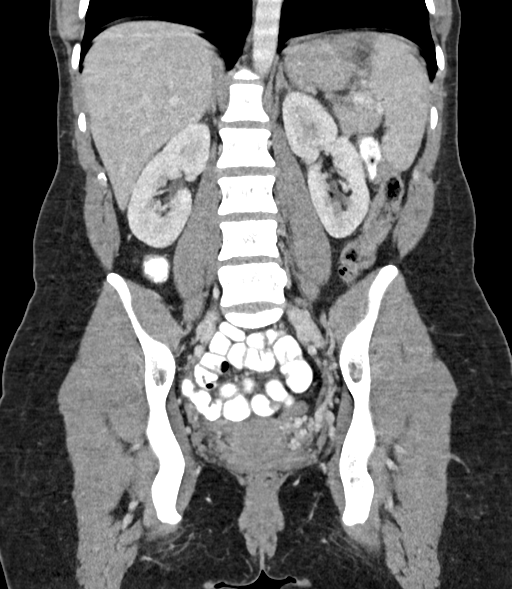

[16 of 46 positions shown; findings below may reference images not displayed]

FINDINGS: Lower chest: No acute abnormality.

Hepatobiliary: No focal liver abnormality is seen. No gallstones,
gallbladder wall thickening, or biliary dilatation.

Pancreas: Unremarkable. No pancreatic ductal dilatation or
surrounding inflammatory changes.

Spleen: Normal in size without focal abnormality.

Adrenals/Urinary Tract: Adrenal glands are unremarkable. Kidneys are
normal, without renal calculi, focal lesion, or hydronephrosis.
Bladder is unremarkable.

Stomach/Bowel: Stomach is within normal limits. Appendix appears
normal. No evidence of bowel wall thickening, distention, or
inflammatory changes.

Vascular/Lymphatic: No significant vascular findings are present. No
enlarged abdominal or pelvic lymph nodes.

Reproductive: Prominent tortuous vascularity noted in the left
adnexa. No suspicious adnexal mass identified. Uterus is
unremarkable.

Other: Trace free fluid in the dependent pelvis, most likely
physiologic in nature. No frank ascites.

Musculoskeletal: No acute or significant osseous findings.
IMPRESSION: 1. No acute abnormality identified in the abdomen or pelvis.
2. Prominent left adnexal vascularity which is nonspecific and could
be seen with pelvic congestion, correlate clinically.

## 2023-05-21 ENCOUNTER — Other Ambulatory Visit: Payer: Self-pay | Admitting: Family Medicine

## 2023-05-21 DIAGNOSIS — R61 Generalized hyperhidrosis: Secondary | ICD-10-CM

## 2023-05-22 NOTE — Telephone Encounter (Signed)
Requested Prescriptions  Pending Prescriptions Disp Refills   glycopyrrolate (ROBINUL) 2 MG tablet [Pharmacy Med Name: Glycopyrrolate 2 MG Oral Tablet] 180 tablet 0    Sig: Take 1 tablet by mouth twice daily     Gastroenterology:  Antispasmodic Agents Passed - 05/21/2023 12:11 PM      Passed - Valid encounter within last 12 months    Recent Outpatient Visits           4 months ago Epigastric pain   [facility] [facility], Ocie Bob, MD   10 months ago Contusion of left shoulder, initial encounter   [facility], Ocie Bob, MD   11 months ago Hyperhidrosis   [facility], Ocie Bob, MD   1 year ago Hypertension, unspecified type   [facility], Ocie Bob, MD   1 year ago Annual physical exam   [facility], Ocie Bob, MD       Future Appointments             In 2 months Deirdre Evener, MD [facility]

## 2023-05-25 ENCOUNTER — Encounter: Payer: Self-pay | Admitting: Family Medicine

## 2023-05-25 NOTE — Telephone Encounter (Signed)
Please advise 

## 2023-05-26 ENCOUNTER — Other Ambulatory Visit: Payer: Self-pay | Admitting: Family Medicine

## 2023-05-26 DIAGNOSIS — I10 Essential (primary) hypertension: Secondary | ICD-10-CM

## 2023-05-26 NOTE — Telephone Encounter (Signed)
Requested Prescriptions  Pending Prescriptions Disp Refills   lisinopril (ZESTRIL) 10 MG tablet [Pharmacy Med Name: Lisinopril 10 MG Oral Tablet] 90 tablet 0    Sig: Take 1 tablet by mouth once daily     Cardiovascular:  ACE Inhibitors Passed - 05/26/2023  9:17 AM      Passed - Cr in normal range and within 180 days    Creatinine, Ser  Date Value Ref Range Status  01/20/2023 0.87 0.57 - 1.00 mg/dL Final         Passed - K in normal range and within 180 days    Potassium  Date Value Ref Range Status  01/20/2023 4.2 3.5 - 5.2 mmol/L Final         Passed - Patient is not pregnant      Passed - Last BP in normal range    BP Readings from Last 1 Encounters:  04/24/23 125/75         Passed - Valid encounter within last 6 months    Recent Outpatient Visits           4 months ago Epigastric pain   [place] [facility], Ocie Bob, MD   10 months ago Contusion of left shoulder, initial encounter   [facility], Ocie Bob, MD   12 months ago Hyperhidrosis   [facility], Ocie Bob, MD   1 year ago Hypertension, unspecified type   [facility], Ocie Bob, MD   1 year ago Annual physical exam   [facility], Ocie Bob, MD       Future Appointments             In 2 months Deirdre Evener, MD [facility]

## 2023-05-31 ENCOUNTER — Ambulatory Visit (INDEPENDENT_AMBULATORY_CARE_PROVIDER_SITE_OTHER): Payer: Medicaid Other | Admitting: Family Medicine

## 2023-05-31 ENCOUNTER — Encounter: Payer: Self-pay | Admitting: Family Medicine

## 2023-05-31 VITALS — BP 124/80 | HR 78 | Ht 66.0 in | Wt 158.0 lb

## 2023-05-31 DIAGNOSIS — R5382 Chronic fatigue, unspecified: Secondary | ICD-10-CM | POA: Diagnosis not present

## 2023-05-31 DIAGNOSIS — D649 Anemia, unspecified: Secondary | ICD-10-CM | POA: Diagnosis not present

## 2023-05-31 DIAGNOSIS — L659 Nonscarring hair loss, unspecified: Secondary | ICD-10-CM | POA: Diagnosis not present

## 2023-05-31 MED ORDER — TRAZODONE HCL 50 MG PO TABS
25.0000 mg | ORAL_TABLET | Freq: Every evening | ORAL | 0 refills | Status: DC | PRN
Start: 2023-05-31 — End: 2023-11-16

## 2023-05-31 NOTE — Assessment & Plan Note (Signed)
Acute on chronic fatigue, has been worst over past few months, denies any specific exposures or changes other than some familial stressors (daughter with kindergarten) and disrupted sleep due to restless legs. When asked, she states that her cycle is typically regular but heavy, however past 2 cycles have been "better".   We reviewed differential and given her stated history, will order appropriate risk stratification labs.  Plan: - Obtain labs today - Use trazodone (1/2 - 1 tablet) nightly x 2 weeks, then as-needed - Try to gradually increase light physical activity (walking, etc) on a consistent basis - Patient information on sleep hygiene and fatigue interventions provided - Contact us to provide a status update in 2-4 weeks - Will follow labs and response to trazodone accordingly

## 2023-05-31 NOTE — Patient Instructions (Addendum)
-   Obtain labs today - Use trazodone (1/2 - 1 tablet) nightly x 2 weeks, then as-needed - Try to gradually increase light physical activity (walking, etc) on a consistent basis - Review information attached and below and make changes where applicable - Contact us to provide a status update in 2-4 weeks  Sleep hygiene advice  When possible, maximize regularity in activity and sleep schedule   Regularity in the timing of sleep, food intake, and social activity helps to stabilize the biological clock.Minimizing discrepancies in sleep timing between on-shift and off-shift periods may help you adapt to a fixed-shift schedule and may also help you adapt to each shift type in a rotating-shift schedule (depending onrotation speed and direction).   Create a sleep-friendly bedroom environment   Make sure that your bed is comfortable and that your bedroom is dark, quiet, and cool (around 39F or 18C).Blackout shades may be particularly important to block sunlight during daytime sleep. Creating constantbackground noise in the sleep environment with a fan or humidifier, for example, will eliminate unexpectedsounds that would otherwise wake you up.   Limit exposure to bright light before daytime sleep   Exposure to bright light (eg, sunlight during the morning commute home following a night shift) can bealerting and may also set your biological clock to a time that interferes with daytime sleep.   Make the last hour before bed a "wind-down" time   Engage in relaxing and pleasant activities, dim or block light in the room, and have a light snack.   Do not use alcohol to help you sleep and do not consume alcohol too close to bedtime   Although alcohol may help you to fall asleep more easily, it disrupts your sleep during the night by causingfrequent awakenings. One drink of alcohol should not be consumed within three hours of bedtime.   Smoking and other drugs will disrupt your sleep   If you smoke, do  not smoke too close to bedtime or if you wake up during the intended sleep period. Mostdrugs of abuse can disrupt sleep.   Avoid caffeinated products within six hours of bedtime   In addition to coffee, these may include tea, chocolate, and many sodas.   Exercise regularly, but avoid activities that raise body temperature close to bedtime   Regular exercise can improve sleep quality, but exercising or having a warm bath too close to bedtime candisrupt your ability to fall asleep. Warm baths should be avoided within 1.5 hours of bedtime.   Avoid consuming more than 8 to 10 ounces of liquids close to bedtime   A full or semi-full bladder can contribute to awakenings. Restrict liquids close to bedtime and empty yourbladder just before going to bed.

## 2023-05-31 NOTE — Progress Notes (Signed)
     Primary Care / Sports Medicine Office Visit  Patient Information:  Patient ID: SAUSHA RAYMOND, female DOB: 1990-12-20 Age: 32 y.o. MRN: [id number]   VIANEY CANIGLIA is a pleasant 32 y.o. female presenting with the following:  Chief Complaint  Patient presents with   Fatigue    No caffeine seems to help for a couple months    Vitals:   05/31/23 1012  BP: 124/80  Pulse: 78  SpO2: 98%   Vitals:   05/31/23 1012  Weight: 158 lb (71.7 kg)  Height: 5\' 6"  (1.676 m)   Body mass index is 25.5 kg/m.  No results found.   Independent interpretation of notes and tests performed by another provider:   None  Procedures performed:   None  Pertinent History, Exam, Impression, and Recommendations:   Problem List Items Addressed This Visit       Other   Chronic fatigue - Primary    Acute on chronic fatigue, has been worst over past few months, denies any specific exposures or changes other than some familial stressors (daughter with kindergarten) and disrupted sleep due to restless legs. When asked, she states that her cycle is typically regular but heavy, however past 2 cycles have been "better".   We reviewed differential and given her stated history, will order appropriate risk stratification labs.  Plan: - Obtain labs today - Use trazodone (1/2 - 1 tablet) nightly x 2 weeks, then as-needed - Try to gradually increase light physical activity (walking, etc) on a consistent basis - Patient information on sleep hygiene and fatigue interventions provided - Contact us to provide a status update in 2-4 weeks - Will follow labs and response to trazodone accordingly      Relevant Medications   traZODone (DESYREL) 50 MG tablet   Other Relevant Orders   Comprehensive metabolic panel   CBC   VITAMIN D 25 Hydroxy (Vit-D Deficiency, Fractures)   B12 and Folate Panel     Orders & Medications Medications:  Meds ordered this encounter  Medications   traZODone (DESYREL)  50 MG tablet    Sig: Take 0.5-1 tablets (25-50 mg total) by mouth at bedtime as needed for sleep.    Dispense:  30 tablet    Refill:  0   Orders Placed This Encounter  Procedures   Comprehensive metabolic panel   CBC   VITAMIN D 25 Hydroxy (Vit-D Deficiency, Fractures)   B12 and Folate Panel     Return in about 6 months (around 11/29/2023) for CPE.     Jerrol Banana, MD, [facility]   Primary Care Sports Medicine Primary Care and Sports Medicine at [facility]

## 2023-06-01 ENCOUNTER — Encounter: Payer: Self-pay | Admitting: Dermatology

## 2023-06-01 ENCOUNTER — Encounter: Payer: Self-pay | Admitting: Family Medicine

## 2023-06-01 LAB — CBC
Hematocrit: 33.8 % — ABNORMAL LOW (ref 34.0–46.6)
Hemoglobin: 10.1 g/dL — ABNORMAL LOW (ref 11.1–15.9)
MCH: 23.3 pg — ABNORMAL LOW (ref 26.6–33.0)
MCHC: 29.9 g/dL — ABNORMAL LOW (ref 31.5–35.7)
MCV: 78 fL — ABNORMAL LOW (ref 79–97)
Platelets: 269 10*3/uL (ref 150–450)
RBC: 4.33 x10E6/uL (ref 3.77–5.28)
RDW: 17.2 % — ABNORMAL HIGH (ref 11.7–15.4)
WBC: 5.6 10*3/uL (ref 3.4–10.8)

## 2023-06-01 LAB — COMPREHENSIVE METABOLIC PANEL
ALT: 11 [IU]/L (ref 0–32)
AST: 20 [IU]/L (ref 0–40)
Albumin: 4.1 g/dL (ref 3.9–4.9)
Alkaline Phosphatase: 52 [IU]/L (ref 44–121)
BUN/Creatinine Ratio: 15 (ref 9–23)
BUN: 14 mg/dL (ref 6–20)
Bilirubin Total: 0.4 mg/dL (ref 0.0–1.2)
CO2: 22 mmol/L (ref 20–29)
Calcium: 9.2 mg/dL (ref 8.7–10.2)
Chloride: 103 mmol/L (ref 96–106)
Creatinine, Ser: 0.94 mg/dL (ref 0.57–1.00)
Globulin, Total: 3.5 g/dL (ref 1.5–4.5)
Glucose: 77 mg/dL (ref 70–99)
Potassium: 4.1 mmol/L (ref 3.5–5.2)
Sodium: 137 mmol/L (ref 134–144)
Total Protein: 7.6 g/dL (ref 6.0–8.5)
eGFR: 82 mL/min/{1.73_m2} (ref >59)

## 2023-06-01 LAB — THYROID PANEL WITH TSH
Free Thyroxine Index: 1.4 (ref 1.2–4.9)
T3 Uptake Ratio: 25 % (ref 24–39)
T4, Total: 5.5 ug/dL (ref 4.5–12.0)
TSH: 0.719 u[IU]/mL (ref 0.450–4.500)

## 2023-06-01 LAB — VITAMIN D 25 HYDROXY (VIT D DEFICIENCY, FRACTURES): Vit D, 25-Hydroxy: 41.1 ng/mL (ref 30.0–100.0)

## 2023-06-01 LAB — B12 AND FOLATE PANEL
Folate: 10.2 ng/mL (ref >3.0)
Vitamin B-12: 384 pg/mL (ref 232–1245)

## 2023-06-02 LAB — SPECIMEN STATUS REPORT

## 2023-06-02 LAB — IRON AND TIBC
Iron Saturation: 15 % (ref 15–55)
Iron: 57 ug/dL (ref 27–159)
Total Iron Binding Capacity: 385 ug/dL (ref 250–450)
UIBC: 328 ug/dL (ref 131–425)

## 2023-06-05 ENCOUNTER — Telehealth: Payer: Self-pay

## 2023-06-05 MED ORDER — MINOXIDIL 2.5 MG PO TABS: ORAL_TABLET | ORAL | 1 refills | Status: DC

## 2023-06-05 NOTE — Telephone Encounter (Signed)
Patient advised of pathology results and medication sent to pharmacy.

## 2023-06-05 NOTE — Telephone Encounter (Signed)
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

## 2023-07-24 ENCOUNTER — Ambulatory Visit: Payer: Medicaid Other | Admitting: Family Medicine

## 2023-07-24 ENCOUNTER — Ambulatory Visit
Admission: EM | Admit: 2023-07-24 | Discharge: 2023-07-24 | Disposition: A | Discharge status: Home / Self Care | Payer: Medicaid Other | Attending: Internal Medicine | Admitting: Internal Medicine

## 2023-07-24 DIAGNOSIS — M545 Low back pain, unspecified: Secondary | ICD-10-CM | POA: Diagnosis not present

## 2023-07-24 MED ORDER — KETOROLAC TROMETHAMINE 30 MG/ML IJ SOLN
30.0000 mg | Freq: Once | INTRAMUSCULAR | Status: AC
  Administered 2023-07-24: 30 mg via INTRAMUSCULAR

## 2023-07-24 MED ORDER — NAPROXEN 500 MG PO TABS: 500.0000 mg | ORAL_TABLET | Freq: Two times a day (BID) | ORAL | 0 refills | Status: DC | PRN

## 2023-07-24 MED ORDER — CYCLOBENZAPRINE HCL 10 MG PO TABS: 10.0000 mg | ORAL_TABLET | Freq: Two times a day (BID) | ORAL | 0 refills | Status: DC | PRN

## 2023-07-24 NOTE — ED Triage Notes (Signed)
Pt presents to UC c/o lower back pain onset yesterday, pt states she moved wrong and started having pain. Pt has been taking ibuprofen for pain.
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
## 2023-07-24 NOTE — Discharge Instructions (Addendum)
You were given a Toradol injection in clinic today. Do not take any over the counter NSAID's such as Advil, ibuprofen, Aleve, or naproxen for 24 hours. You may take tylenol if needed.  You may start Flexeril today twice daily as needed.  Please note this medication will make you drowsy.  Do not drink alcohol or drive while on this medication.  Start naproxen twice daily as needed tomorrow, 12/3.  Heat and rest to the back.  Please follow-up with your PCP if your symptoms do not improve.  Please go to the ER for any worsening symptoms.  Hope you feel better soon!

## 2023-07-26 ENCOUNTER — Ambulatory Visit: Payer: Medicaid Other | Admitting: Dermatology

## 2023-08-21 ENCOUNTER — Other Ambulatory Visit: Payer: Self-pay | Admitting: Family Medicine

## 2023-08-21 DIAGNOSIS — I10 Essential (primary) hypertension: Secondary | ICD-10-CM

## 2023-08-21 DIAGNOSIS — R61 Generalized hyperhidrosis: Secondary | ICD-10-CM

## 2023-08-23 ENCOUNTER — Other Ambulatory Visit: Payer: Self-pay | Admitting: Family Medicine

## 2023-08-23 DIAGNOSIS — R61 Generalized hyperhidrosis: Secondary | ICD-10-CM

## 2023-08-23 DIAGNOSIS — I10 Essential (primary) hypertension: Secondary | ICD-10-CM

## 2023-08-24 ENCOUNTER — Encounter: Payer: Self-pay | Admitting: Family Medicine

## 2023-08-24 ENCOUNTER — Other Ambulatory Visit: Payer: Self-pay

## 2023-08-24 MED ORDER — LISINOPRIL 10 MG PO TABS
10.0000 mg | ORAL_TABLET | Freq: Every day | ORAL | 0 refills | Status: DC
Start: 2023-08-24 — End: 2023-11-22

## 2023-08-24 NOTE — Telephone Encounter (Signed)
 Please review

## 2023-09-28 ENCOUNTER — Ambulatory Visit: Payer: Medicaid Other | Admitting: Dermatology

## 2023-10-17 ENCOUNTER — Ambulatory Visit: Payer: Medicaid Other | Admitting: Dermatology

## 2023-10-23 ENCOUNTER — Telehealth: Admitting: Physician Assistant

## 2023-10-23 DIAGNOSIS — N72 Inflammatory disease of cervix uteri: Secondary | ICD-10-CM | POA: Diagnosis not present

## 2023-10-23 DIAGNOSIS — K5900 Constipation, unspecified: Secondary | ICD-10-CM
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
## 2023-10-23 NOTE — Patient Instructions (Signed)
 Tina Hughes, thank you for joining Margaretann Loveless, PA-C for today's virtual visit.  While this provider is not your primary care provider (PCP), if your PCP is located in our provider database this encounter information will be shared with them immediately following your visit.   A [facility] MyChart account gives you access to today's visit and all your visits, tests, and labs performed at [facility] " click here if you don't have a [facility] MyChart account or go to [email or web address]  Consent: (Patient) Tina Hughes provided verbal consent for this virtual visit at the beginning of the encounter.  Current Medications:  Current Outpatient Medications:    cyclobenzaprine (FLEXERIL) 10 MG tablet, Take 1 tablet (10 mg total) by mouth 2 (two) times daily as needed for muscle spasms., Disp: 10 tablet, Rfl: 0   glycopyrrolate (ROBINUL) 2 MG tablet, Take 1 tablet by mouth twice daily, Disp: 180 tablet, Rfl: 0   ketoconazole (NIZORAL) 2 % shampoo, Apply topically. (Patient not taking: Reported on 05/31/2023), Disp: , Rfl:    lisinopril (ZESTRIL) 10 MG tablet, Take 1 tablet (10 mg total) by mouth daily., Disp: 90 tablet, Rfl: 0   minoxidil (LONITEN) 2.5 MG tablet, Take 1/2 tab po QD., Disp: 45 tablet, Rfl: 1   naproxen (NAPROSYN) 500 MG tablet, Take 1 tablet (500 mg total) by mouth 2 (two) times daily as needed (back pain)., Disp: 14 tablet, Rfl: 0   traZODone (DESYREL) 50 MG tablet, Take 0.5-1 tablets (25-50 mg total) by mouth at bedtime as needed for sleep., Disp: 30 tablet, Rfl: 0   Medications ordered in this encounter:  No orders of the defined types were placed in this encounter.    *If you need refills on other medications prior to your next appointment, please contact your pharmacy*  Follow-Up: Call back or seek an in-person evaluation if the symptoms worsen or if the condition fails to improve as anticipated.  [facility] [phone]  Other Instructions  Constipation, Adult Constipation is when a person has fewer than three bowel movements in a week, has difficulty having a bowel movement, or has stools (feces) that are dry, hard, or larger than normal. Constipation may be caused by an underlying condition. It may become worse with age if a person takes certain medicines and does not take in enough fluids. Follow these instructions at home: Eating and drinking  Eat foods that have a lot of fiber, such as beans, whole grains, and fresh fruits and vegetables. Limit foods that are low in fiber and high in fat and processed sugars, such as fried or sweet foods. These include french fries, hamburgers, cookies, candies, and soda. Drink enough fluid to keep your urine pale yellow. General instructions Exercise regularly or as told by your health care provider. Try to do 150 minutes of moderate exercise each week. Use the bathroom when you have the urge to go. Do not hold it in. Take over-the-counter and prescription medicines only as told by your health care provider. This includes any fiber supplements. During bowel movements: Practice deep breathing while relaxing the lower abdomen. Practice pelvic floor relaxation. Watch your condition for any changes. Let your health care provider know about them. Keep all follow-up visits as told by your health care provider. This is important. Contact a health care provider if: You have pain that gets worse. You have a fever. You do not have a bowel movement after 4 days. You vomit. You are not  hungry or you lose weight. You are bleeding from the opening between the buttocks (anus). You have thin, pencil-like stools. Get help right away if: You have a fever and your symptoms suddenly get worse. You leak stool or have blood in your stool. Your abdomen is bloated. You have severe pain in your abdomen. You feel dizzy or you faint. Summary Constipation is when a person has  fewer than three bowel movements in a week, has difficulty having a bowel movement, or has stools (feces) that are dry, hard, or larger than normal. Eat foods that have a lot of fiber, such as beans, whole grains, and fresh fruits and vegetables. Drink enough fluid to keep your urine pale yellow. Take over-the-counter and prescription medicines only as told by your health care provider. This includes any fiber supplements. This information is not intended to replace advice given to you by your health care provider. Make sure you discuss any questions you have with your health care provider. Document Revised: 06/22/2022 Document Reviewed: 06/22/2022 Elsevier Patient Education  2024 Elsevier Inc.   If you have been instructed to have an in-person evaluation today at a local Urgent Care facility, please use the link below. It will take you to a list of all of our available [facility], including address, phone number and hours of operation. Please do not delay care.  [facility]  If you or a family member do not have a primary care provider, use the link below to schedule a visit and establish care. When you choose a [facility] primary care physician or advanced practice provider, you gain a long-term partner in health. Find a Primary Care Provider  Learn more about [facility]'s in-office and virtual care options: [place] - Get Care Now

## 2023-11-07 ENCOUNTER — Ambulatory Visit: Admitting: Certified Nurse Midwife

## 2023-11-08 ENCOUNTER — Ambulatory Visit: Payer: Medicaid Other | Admitting: Family Medicine

## 2023-11-13 ENCOUNTER — Ambulatory Visit: Admitting: Certified Nurse Midwife

## 2023-11-13 DIAGNOSIS — R102 Pelvic and perineal pain: Secondary | ICD-10-CM

## 2023-11-16 ENCOUNTER — Encounter: Payer: Self-pay | Admitting: Family Medicine

## 2023-11-16 ENCOUNTER — Ambulatory Visit (INDEPENDENT_AMBULATORY_CARE_PROVIDER_SITE_OTHER): Admitting: Family Medicine

## 2023-11-16 VITALS — BP 130/80 | HR 81 | Ht 66.0 in | Wt 157.0 lb

## 2023-11-16 DIAGNOSIS — F339 Major depressive disorder, recurrent, unspecified: Secondary | ICD-10-CM | POA: Diagnosis not present

## 2023-11-16 DIAGNOSIS — Z0000 Encounter for general adult medical examination without abnormal findings: Secondary | ICD-10-CM

## 2023-11-16 DIAGNOSIS — Z136 Encounter for screening for cardiovascular disorders: Secondary | ICD-10-CM

## 2023-11-16 DIAGNOSIS — Z1159 Encounter for screening for other viral diseases: Secondary | ICD-10-CM

## 2023-11-16 DIAGNOSIS — R5382 Chronic fatigue, unspecified: Secondary | ICD-10-CM

## 2023-11-16 DIAGNOSIS — R7309 Other abnormal glucose: Secondary | ICD-10-CM

## 2023-11-16 DIAGNOSIS — R799 Abnormal finding of blood chemistry, unspecified: Secondary | ICD-10-CM | POA: Diagnosis not present

## 2023-11-16 MED ORDER — DESVENLAFAXINE SUCCINATE ER 25 MG PO TB24: 25.0000 mg | ORAL_TABLET | Freq: Every day | ORAL | 2 refills | Status: DC

## 2023-11-16 MED ORDER — TRAZODONE HCL 50 MG PO TABS
25.0000 mg | ORAL_TABLET | Freq: Every evening | ORAL | 0 refills | Status: DC | PRN
Start: 2023-11-16 — End: 2023-12-11

## 2023-11-16 NOTE — Patient Instructions (Signed)
 Patient Care Plan  1. Major Depression Episode and Acute Stress Reaction:    - Start taking desvenlafaxine (Pristiq) daily as prescribed.    - Restart taking trazodone nightly to help with sleep.    - Schedule a follow-up video visit in four weeks to evaluate how the medication is working and make any necessary adjustments.    - Complete lab work for annual blood tests, including cholesterol, before your next visit.  2. Sleep Disturbance:    - Take trazodone nightly to help with sleep.    - Adjust the trazodone dose based on your sleep patterns and how you respond to the medication.  3. Decreased Libido:    - Work on reducing stress through medication and lifestyle changes to help improve libido.  Red Flags  - Contact our office if you experience any new or worsening symptoms, side effects from medications, or if there is no improvement in mood or sleep within 4-6 weeks.

## 2023-11-16 NOTE — Assessment & Plan Note (Signed)
 History of Present Illness Tina Hughes is a 33 year old female who presents with mood disturbances and stress.  She has been experiencing mood disturbances and stress primarily due to financial concerns, which have been affecting her daily life over the past week. There is no specific triggering event, but she feels that the stress has accumulated over time.  She has a history of postpartum depression following the birth of her first son, during which she was treated with Lexapro. She recalls feeling 'muted' in her personality while on Lexapro, which she attributes to the medication dose at that time. In the past, she was also prescribed sertraline in December 2022 and July 2023, but she does not recall significant effects from this medication. She has not been taking trazodone recently, which was previously prescribed to aid sleep, and confirms that she has not been using any current medications for mood or anxiety.  Regarding sleep, she generally sleeps well most nights, although she sometimes has difficulty falling asleep. Despite the current stress, her sleep has not been significantly disrupted. No significant changes in her sleep pattern aside from occasional difficulty falling asleep. She denies any current use of trazodone. She acknowledges feeling stressed and having mood disturbances.  Physical Exam VITALS: P- 81  Assessment and Plan Major depression episode, acute Stress Reaction  Explained stress impact on mood and sleep. Discussed SSRIs and SNRIs, emphasizing low-dose initiation to avoid side effects. Anticipated mood and sleep improvement in 4-6 weeks. - Start desvenlafaxine (Pristiq) daily. - Restart trazodone for sleep, nightly. - Schedule follow-up video visit in four weeks to assess medication efficacy and adjust treatment. - Provide lab orders for annual blood work, including cholesterol, before next visit.  Sleep Disturbance Difficulty falling asleep linked to acute  stress. Explained stress-sleep connection and trazodone's role in regulating sleep. - Restart trazodone for sleep, nightly. - Adjust trazodone dosing based on sleep patterns and response.  Decreased Libido Decreased libido most likely due to ongoing stress. Explained commonality and connection to stress reaction. - Address underlying stress through medication and lifestyle modifications to improve libido.

## 2023-11-16 NOTE — Progress Notes (Signed)
 Primary Care / Sports Medicine Office Visit  Patient Information:  Patient ID: Tina Hughes, female DOB: [birth year] Age: 33 y.o. MRN: [id number]   Tina Hughes is a pleasant 33 y.o. female presenting with the following:  Chief Complaint  Patient presents with   Annual Exam    Patient presents today for a annual exam . She is doing well. She would like to discuss her mood.     Vitals:   11/16/23 1008  BP: 130/80  Pulse: 81  SpO2: 98%   Vitals:   11/16/23 1008  Weight: 157 lb (71.2 kg)  Height: 5\' 6"  (1.676 m)   Body mass index is 25.34 kg/m.  No results found.   Independent interpretation of notes and tests performed by another provider:   None  Procedures performed:   None  Pertinent History, Exam, Impression, and Recommendations:   Problem List Items Addressed This Visit     Chronic fatigue   Relevant Medications   traZODone (DESYREL) 50 MG tablet   Major depressive disorder with current active episode   History of Present Illness Tina Hughes is a 33 year old female who presents with mood disturbances and stress.  She has been experiencing mood disturbances and stress primarily due to financial concerns, which have been affecting her daily life over the past week. There is no specific triggering event, but she feels that the stress has accumulated over time.  She has a history of postpartum depression following the birth of her first son, during which she was treated with Lexapro. She recalls feeling 'muted' in her personality while on Lexapro, which she attributes to the medication dose at that time. In the past, she was also prescribed sertraline in December 2022 and July 2023, but she does not recall significant effects from this medication. She has not been taking trazodone recently, which was previously prescribed to aid sleep, and confirms that she has not been using any current medications for mood or anxiety.  Regarding sleep, she  generally sleeps well most nights, although she sometimes has difficulty falling asleep. Despite the current stress, her sleep has not been significantly disrupted. No significant changes in her sleep pattern aside from occasional difficulty falling asleep. She denies any current use of trazodone. She acknowledges feeling stressed and having mood disturbances.  Physical Exam VITALS: P- 81  Assessment and Plan Major depression episode, acute Stress Reaction  Explained stress impact on mood and sleep. Discussed SSRIs and SNRIs, emphasizing low-dose initiation to avoid side effects. Anticipated mood and sleep improvement in 4-6 weeks. - Start desvenlafaxine (Pristiq) daily. - Restart trazodone for sleep, nightly. - Schedule follow-up video visit in four weeks to assess medication efficacy and adjust treatment. - Provide lab orders for annual blood work, including cholesterol, before next visit.  Sleep Disturbance Difficulty falling asleep linked to acute stress. Explained stress-sleep connection and trazodone's role in regulating sleep. - Restart trazodone for sleep, nightly. - Adjust trazodone dosing based on sleep patterns and response.  Decreased Libido Decreased libido most likely due to ongoing stress. Explained commonality and connection to stress reaction. - Address underlying stress through medication and lifestyle modifications to improve libido.      Relevant Medications   desvenlafaxine (PRISTIQ) 25 MG 24 hr tablet   traZODone (DESYREL) 50 MG tablet   Other Visit Diagnoses       Healthcare maintenance    -  Primary   Relevant Orders   CBC  Screening for cardiovascular condition       Relevant Orders   Comprehensive metabolic panel with GFR   Lipid panel     Screening for viral disease       Relevant Orders   Hepatitis C antibody     Abnormal glucose       Relevant Orders   Hemoglobin A1c        Orders & Medications Medications:  Meds ordered this encounter   Medications   desvenlafaxine (PRISTIQ) 25 MG 24 hr tablet    Sig: Take 1 tablet (25 mg total) by mouth daily.    Dispense:  30 tablet    Refill:  2   traZODone (DESYREL) 50 MG tablet    Sig: Take 0.5-1 tablets (25-50 mg total) by mouth at bedtime as needed for sleep.    Dispense:  30 tablet    Refill:  0   Orders Placed This Encounter  Procedures   CBC   Comprehensive metabolic panel with GFR   Hemoglobin A1c   Lipid panel   Hepatitis C antibody     Return in about 4 weeks (around 12/14/2023) for video or in person, patient preference .     Jerrol Banana, MD, [facility]   Primary Care Sports Medicine Primary Care and Sports Medicine at [facility]

## 2023-11-17 ENCOUNTER — Encounter: Payer: Self-pay | Admitting: Family Medicine

## 2023-11-17 LAB — COMPREHENSIVE METABOLIC PANEL WITH GFR
ALT: 11 IU/L (ref 0–32)
AST: 15 IU/L (ref 0–40)
Albumin: 4.5 g/dL (ref 3.9–4.9)
Alkaline Phosphatase: 72 IU/L (ref 44–121)
BUN/Creatinine Ratio: 11 (ref 9–23)
BUN: 13 mg/dL (ref 6–20)
Bilirubin Total: 0.3 mg/dL (ref 0.0–1.2)
CO2: 18 mmol/L — ABNORMAL LOW (ref 20–29)
Calcium: 9.3 mg/dL (ref 8.7–10.2)
Chloride: 105 mmol/L (ref 96–106)
Creatinine, Ser: 1.17 mg/dL — ABNORMAL HIGH (ref 0.57–1.00)
Globulin, Total: 3.2 g/dL (ref 1.5–4.5)
Glucose: 89 mg/dL (ref 70–99)
Potassium: 4.7 mmol/L (ref 3.5–5.2)
Sodium: 140 mmol/L (ref 134–144)
Total Protein: 7.7 g/dL (ref 6.0–8.5)
eGFR: 63 mL/min/{1.73_m2} (ref >59)

## 2023-11-17 LAB — HEPATITIS C ANTIBODY: Hep C Virus Ab: NONREACTIVE

## 2023-11-17 LAB — CBC
Hematocrit: 32 % — ABNORMAL LOW (ref 34.0–46.6)
Hemoglobin: 10 g/dL — ABNORMAL LOW (ref 11.1–15.9)
MCH: 24.3 pg — ABNORMAL LOW (ref 26.6–33.0)
MCHC: 31.3 g/dL — ABNORMAL LOW (ref 31.5–35.7)
MCV: 78 fL — ABNORMAL LOW (ref 79–97)
Platelets: 333 10*3/uL (ref 150–450)
RBC: 4.11 x10E6/uL (ref 3.77–5.28)
RDW: 15.3 % (ref 11.7–15.4)
WBC: 5 10*3/uL (ref 3.4–10.8)

## 2023-11-17 LAB — LIPID PANEL
Chol/HDL Ratio: 3.2 ratio (ref 0.0–4.4)
Cholesterol, Total: 183 mg/dL (ref 100–199)
HDL: 58 mg/dL (ref >39)
LDL Chol Calc (NIH): 101 mg/dL — ABNORMAL HIGH (ref 0–99)
Triglycerides: 137 mg/dL (ref 0–149)
VLDL Cholesterol Cal: 24 mg/dL (ref 5–40)

## 2023-11-17 LAB — HEMOGLOBIN A1C
Est. average glucose Bld gHb Est-mCnc: 103 mg/dL
Hgb A1c MFr Bld: 5.2 % (ref 4.8–5.6)

## 2023-11-17 NOTE — Telephone Encounter (Signed)
 Please review

## 2023-11-19 ENCOUNTER — Other Ambulatory Visit: Payer: Self-pay | Admitting: Family Medicine

## 2023-11-19 DIAGNOSIS — D509 Iron deficiency anemia, unspecified: Secondary | ICD-10-CM

## 2023-11-19 LAB — ANEMIA PROFILE A
Basophils Absolute: 0.1 10*3/uL (ref 0.0–0.2)
Basos: 1 %
EOS (ABSOLUTE): 0 10*3/uL (ref 0.0–0.4)
Eos: 1 %
Hematocrit: 33.1 % — ABNORMAL LOW (ref 34.0–46.6)
Hemoglobin: 9.9 g/dL — ABNORMAL LOW (ref 11.1–15.9)
Immature Grans (Abs): 0 10*3/uL (ref 0.0–0.1)
Immature Granulocytes: 0 %
Iron Saturation: 6 % — CL (ref 15–55)
Iron: 25 ug/dL — ABNORMAL LOW (ref 27–159)
Lymphocytes Absolute: 1.9 10*3/uL (ref 0.7–3.1)
Lymphs: 37 %
MCH: 24 pg — ABNORMAL LOW (ref 26.6–33.0)
MCHC: 29.9 g/dL — ABNORMAL LOW (ref 31.5–35.7)
MCV: 80 fL (ref 79–97)
Monocytes Absolute: 0.3 10*3/uL (ref 0.1–0.9)
Monocytes: 6 %
Neutrophils Absolute: 2.9 10*3/uL (ref 1.4–7.0)
Neutrophils: 55 %
Platelets: 381 10*3/uL (ref 150–450)
RBC: 4.13 x10E6/uL (ref 3.77–5.28)
RDW: 15.7 % — ABNORMAL HIGH (ref 11.7–15.4)
Retic Ct Pct: 1.2 % (ref 0.6–2.6)
Total Iron Binding Capacity: 413 ug/dL (ref 250–450)
UIBC: 388 ug/dL (ref 131–425)
WBC: 5.1 10*3/uL (ref 3.4–10.8)

## 2023-11-19 LAB — SPECIMEN STATUS REPORT

## 2023-11-20 ENCOUNTER — Other Ambulatory Visit: Payer: Self-pay | Admitting: Family Medicine

## 2023-11-20 DIAGNOSIS — I10 Essential (primary) hypertension: Secondary | ICD-10-CM

## 2023-11-20 NOTE — Telephone Encounter (Signed)
 Please review.  KP

## 2023-11-21 ENCOUNTER — Ambulatory Visit: Payer: Medicaid Other | Admitting: Dermatology

## 2023-11-21 ENCOUNTER — Other Ambulatory Visit: Payer: Self-pay | Admitting: Family Medicine

## 2023-11-21 DIAGNOSIS — R61 Generalized hyperhidrosis: Secondary | ICD-10-CM

## 2023-11-22 NOTE — Telephone Encounter (Signed)
 Requested Prescriptions  Pending Prescriptions Disp Refills   lisinopril (ZESTRIL) 10 MG tablet [Pharmacy Med Name: Lisinopril 10 MG Oral Tablet] 90 tablet 0    Sig: Take 1 tablet by mouth once daily     Cardiovascular:  ACE Inhibitors Failed - 11/22/2023  8:44 AM      Failed - Cr in normal range and within 180 days    Creatinine, Ser  Date Value Ref Range Status  11/16/2023 1.17 (H) 0.57 - 1.00 mg/dL Final         Passed - K in normal range and within 180 days    Potassium  Date Value Ref Range Status  11/16/2023 4.7 3.5 - 5.2 mmol/L Final         Passed - Patient is not pregnant      Passed - Last BP in normal range    BP Readings from Last 1 Encounters:  11/16/23 130/80         Passed - Valid encounter within last 6 months    Recent Outpatient Visits           6 days ago Episode of recurrent major depressive disorder, unspecified depression episode severity (HCC)   [place] [facility] & [facility] at [facility] Ashley Royalty, Ocie Bob, MD       Future Appointments             In 1 week Elie Goody, MD [facility]   In 3 weeks Ashley Royalty, Ocie Bob, MD [facility] Primary Care & Sports Medicine at [facility], [facility]

## 2023-11-23 NOTE — Telephone Encounter (Signed)
 Requested Prescriptions  Pending Prescriptions Disp Refills   glycopyrrolate (ROBINUL) 2 MG tablet [Pharmacy Med Name: Glycopyrrolate 2 MG Oral Tablet] 180 tablet 1    Sig: Take 1 tablet by mouth twice daily     Gastroenterology:  Antispasmodic Agents Passed - 11/23/2023  9:37 AM      Passed - Valid encounter within last 12 months    Recent Outpatient Visits           1 week ago Episode of recurrent major depressive disorder, unspecified depression episode severity (HCC)   [place] [facility] Medicine at [facility], Ocie Bob, MD       Future Appointments             In 1 week Elie Goody, MD [facility]   In 3 weeks Ashley Royalty, Ocie Bob, MD [facility] Primary Care & Sports Medicine at [facility], [facility]

## 2023-11-30 ENCOUNTER — Ambulatory Visit: Admitting: Certified Nurse Midwife

## 2023-12-05 ENCOUNTER — Ambulatory Visit: Admitting: Dermatology

## 2023-12-10 ENCOUNTER — Other Ambulatory Visit: Payer: Self-pay | Admitting: Family Medicine

## 2023-12-10 DIAGNOSIS — R5382 Chronic fatigue, unspecified: Secondary | ICD-10-CM

## 2023-12-11 NOTE — Telephone Encounter (Signed)
 Requested Prescriptions  Pending Prescriptions Disp Refills   traZODone  (DESYREL ) 50 MG tablet [Pharmacy Med Name: traZODone  HCl 50 MG Oral Tablet] 30 tablet 2    Sig: TAKE 1/2 TO 1 (ONE-HALF TO ONE) TABLET BY MOUTH AT BEDTIME AS NEEDED FOR SLEEP     Psychiatry: Antidepressants - Serotonin Modulator Passed - 12/11/2023  2:51 PM      Passed - Completed PHQ-2 or PHQ-9 in the last 360 days      Passed - Valid encounter within last 6 months    Recent Outpatient Visits           3 weeks ago Episode of recurrent major depressive disorder, unspecified depression episode severity (HCC)   [place] Primary Care & Sports Medicine at [facility], Dessie Flow, MD       Future Appointments             In 3 days Augustus Ledger, Dessie Flow, MD [facility] Primary Care & Sports Medicine at [facility], [facility]

## 2023-12-13 ENCOUNTER — Encounter: Payer: Self-pay | Admitting: Family Medicine

## 2023-12-13 DIAGNOSIS — D509 Iron deficiency anemia, unspecified: Secondary | ICD-10-CM | POA: Diagnosis not present

## 2023-12-13 NOTE — Telephone Encounter (Signed)
 Please review.  KP

## 2023-12-14 ENCOUNTER — Encounter: Payer: Self-pay | Admitting: Certified Nurse Midwife

## 2023-12-14 ENCOUNTER — Telehealth: Admitting: Family Medicine

## 2023-12-14 LAB — IRON,TIBC AND FERRITIN PANEL
Ferritin: 15 ng/mL (ref 15–150)
Iron Saturation: 69 % — ABNORMAL HIGH (ref 15–55)
Iron: 268 ug/dL (ref 27–159)
Total Iron Binding Capacity: 387 ug/dL (ref 250–450)
UIBC: 119 ug/dL — ABNORMAL LOW (ref 131–425)

## 2023-12-15 ENCOUNTER — Telehealth: Admitting: Family Medicine

## 2023-12-15 ENCOUNTER — Encounter: Payer: Self-pay | Admitting: Family Medicine

## 2023-12-15 VITALS — Ht 66.0 in

## 2023-12-15 DIAGNOSIS — R7989 Other specified abnormal findings of blood chemistry: Secondary | ICD-10-CM | POA: Diagnosis not present

## 2023-12-15 DIAGNOSIS — D509 Iron deficiency anemia, unspecified: Secondary | ICD-10-CM | POA: Diagnosis not present

## 2023-12-15 DIAGNOSIS — F339 Major depressive disorder, recurrent, unspecified: Secondary | ICD-10-CM

## 2023-12-15 DIAGNOSIS — R5382 Chronic fatigue, unspecified: Secondary | ICD-10-CM

## 2023-12-15 MED ORDER — DESVENLAFAXINE SUCCINATE ER 50 MG PO TB24
50.0000 mg | ORAL_TABLET | Freq: Every day | ORAL | 1 refills | Status: DC
Start: 2023-12-15 — End: 2024-04-15

## 2023-12-15 NOTE — Progress Notes (Signed)
 Primary Care / Sports Medicine Virtual Visit  Patient Information:  Patient ID: Tina Hughes, female DOB: 11-Feb-1991 Age: 33 y.o. MRN: [id number]   Tina Hughes is a pleasant 33 y.o. female presenting with the following:  Chief Complaint  Patient presents with   Depression    Review of Systems: No fevers, chills, night sweats, weight loss, chest pain, or shortness of breath.   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   glycopyrrolate  (ROBINUL ) 2 MG tablet Take 1 tablet by mouth twice daily   lisinopril  (ZESTRIL ) 10 MG tablet Take 1 tablet by mouth once daily   traZODone  (DESYREL ) 50 MG tablet TAKE 1/2 TO 1 (ONE-HALF TO ONE) TABLET BY MOUTH AT BEDTIME AS NEEDED FOR SLEEP   [DISCONTINUED] desvenlafaxine  (PRISTIQ ) 25 MG 24 hr tablet Take 1 tablet (25 mg total) by mouth daily. (Patient taking differently: Take 50 mg by mouth daily.)   desvenlafaxine  (PRISTIQ ) 50 MG 24 hr tablet Take 1 tablet (50 mg total) by mouth daily.   No facility-administered encounter medications on file as of 12/15/2023.   Past Surgical History:  Procedure Laterality Date   TUBAL LIGATION Bilateral 02/22/2020   Procedure: POST PARTUM TUBAL LIGATION;  Surgeon: Teresa Fender, MD;  Location: [facility] ORS;  Service: Gynecology;  Laterality: Bilateral;    Virtual Visit via MyChart Video:   I connected with  Tina Hughes on 12/15/23 via MyChart Video and verified that I am speaking with the correct person using appropriate identifiers.   The limitations, risks, security and privacy concerns of performing an evaluation and management service by MyChart Video, including the higher likelihood of inaccurate diagnoses and treatments, and the availability of in person appointments were reviewed. The possible need of an additional face-to-face encounter for complete and high quality delivery of care was discussed. The patient was also made aware that there may be a patient responsible charge related to this service. The patient expressed understanding and wishes to proceed.  Provider location is in medical facility. Patient location is at their home, different from provider location. People involved in care of the patient during this telehealth encounter were myself, my nurse/medical assistant, and my front office/scheduling team member.  Objective findings:   General: Speaking full sentences, no audible heavy breathing. Sounds alert and appropriately interactive. Well-appearing. Face symmetric. Extraocular movements intact. Pupils equal and round. No nasal flaring or accessory muscle use visualized.  Independent interpretation of notes and tests performed by another provider:   None  Pertinent History, Exam, Impression, and Recommendations:   Problem List Items Addressed This Visit     Chronic fatigue   See additional assessment(s) for plan details.      Iron deficiency anemia - Primary   Interim iron labs trending in positive direction, ferritin lower limit of normal. Dosing daily Vitron C (contains 65 mg elemental iron).  -  Transition to Monday, Wednesday, Friday dosing of your current iron supplement - Continue lifestyle changes to improve iron (see attached) - Repeat labs in 4 months (order in system for [facility])      Relevant Medications   Ferrous Sulfate  (IRON PO)   Other Relevant  Orders   Iron, TIBC and Ferritin Panel   Major depressive disorder with current active episode   Tolerating Pristiq  25 mg well but still symptomatic despite normalized PHQ and GAD. Had discussed titration to 50 mg, she started that today.  - Continue taking 50 mg of Pristiq  daily - Contact us  at 4-6 weeks for updates if further improvement needed - Contact us  if libido issues persist for Rx management      Relevant Medications   desvenlafaxine  (PRISTIQ ) 50 MG 24 hr tablet   Other Visit Diagnoses       Azotemia       Relevant Orders   BUN+Creat        Orders & Medications Medications:  Meds ordered this encounter  Medications   desvenlafaxine  (PRISTIQ ) 50 MG 24 hr tablet    Sig: Take 1 tablet (50 mg total) by mouth daily.    Dispense:  90 tablet    Refill:  1   Orders Placed This Encounter  Procedures   Iron, TIBC and Ferritin Panel   BUN+Creat     I discussed the above assessment and treatment plan with the patient. The patient was provided an opportunity to ask questions and all were answered. The patient agreed with the plan and demonstrated an understanding of the instructions.   The patient was advised to call back or seek an in-person evaluation if the symptoms worsen or if the condition fails to improve as anticipated.   I provided a total time of 30 minutes including both face-to-face and non-face-to-face time on 12/15/2023 inclusive of time utilized for medical chart review, information gathering, care coordination with staff, and documentation completion.    Ma Saupe, MD, [facility]   Primary Care Sports Medicine Primary Care and Sports Medicine at [facility]

## 2023-12-15 NOTE — Assessment & Plan Note (Signed)
 Tolerating Pristiq  25 mg well but still symptomatic despite normalized PHQ and GAD. Had discussed titration to 50 mg, she started that today.  - Continue taking 50 mg of Pristiq  daily - Contact us  at 4-6 weeks for updates if further improvement needed - Contact us  if libido issues persist for Rx management

## 2023-12-15 NOTE — Assessment & Plan Note (Signed)
 Interim iron labs trending in positive direction, ferritin lower limit of normal. Dosing daily Vitron C (contains 65 mg elemental iron).  - Transition to Monday, Wednesday, Friday dosing of your current iron supplement - Continue lifestyle changes to improve iron (see attached) - Repeat labs in 4 months (order in system for [facility])

## 2023-12-15 NOTE — Patient Instructions (Signed)
 Patient Plan  Iron Deficiency Anemia: 1. Change iron supplement intake to Monday, Wednesday, and Friday. 2. Continue lifestyle changes to help improve iron levels (refer to attached guidelines). 3. Schedule and complete repeat iron labs in 4 months at [facility].  Major Depressive Disorder: 1. Continue taking 50 mg of Pristiq  daily. 2. Contact our office in 4-6 weeks if further improvement is needed. 3. Reach out to us  if you experience ongoing libido issues for possible medication management.  Azotemia (kidney function): 1. Follow up with scheduled BUN and Creatinine tests to monitor kidney function at 4 months\.  Red Flags: - Report any significant changes in mood or energy levels. - Notify us  if you experience any unusual symptoms or side effects from your medication. - Contact us  immediately if you experience symptoms such as decreased urine output, swelling, or confusion.

## 2023-12-15 NOTE — Assessment & Plan Note (Signed)
 See additional assessment(s) for plan details.

## 2023-12-19 NOTE — Telephone Encounter (Signed)
 Please review.  KP

## 2023-12-23 ENCOUNTER — Other Ambulatory Visit: Payer: Self-pay | Admitting: Family Medicine

## 2023-12-23 MED ORDER — TADALAFIL 20 MG PO TABS: 10.0000 mg | ORAL_TABLET | Freq: Every day | ORAL | 11 refills | Status: DC | PRN

## 2024-01-02 ENCOUNTER — Other Ambulatory Visit: Payer: Self-pay

## 2024-01-02 DIAGNOSIS — F339 Major depressive disorder, recurrent, unspecified: Secondary | ICD-10-CM

## 2024-01-02 MED ORDER — DESVENLAFAXINE SUCCINATE ER 25 MG PO TB24: 25.0000 mg | ORAL_TABLET | Freq: Every day | ORAL | 0 refills | Status: DC

## 2024-01-02 NOTE — Telephone Encounter (Signed)
 Please review messages and advise patient. She is on the 25 mg dose of desvenlaflaxine.  JM

## 2024-02-17 ENCOUNTER — Other Ambulatory Visit: Payer: Self-pay | Admitting: Family Medicine

## 2024-02-17 DIAGNOSIS — I10 Essential (primary) hypertension: Secondary | ICD-10-CM

## 2024-02-19 NOTE — Telephone Encounter (Signed)
 Requested Prescriptions  Pending Prescriptions Disp Refills   lisinopril  (ZESTRIL ) 10 MG tablet [Pharmacy Med Name: Lisinopril  10 MG Oral Tablet] 90 tablet 0    Sig: Take 1 tablet by mouth once daily     Cardiovascular:  ACE Inhibitors Failed - 02/19/2024  5:37 PM      Failed - Cr in normal range and within 180 days    Creatinine, Ser  Date Value Ref Range Status  11/16/2023 1.17 (H) 0.57 - 1.00 mg/dL Final         Passed - K in normal range and within 180 days    Potassium  Date Value Ref Range Status  11/16/2023 4.7 3.5 - 5.2 mmol/L Final         Passed - Patient is not pregnant      Passed - Last BP in normal range    BP Readings from Last 1 Encounters:  11/16/23 130/80         Passed - Valid encounter within last 6 months    Recent Outpatient Visits           2 months ago Iron deficiency anemia, unspecified iron deficiency anemia type   [place] [facility] at [facility], Selinda PARAS, MD   3 months ago Episode of recurrent major depressive disorder, unspecified depression episode severity [facility])   [place] [facility] at [facility], Jason J, MD

## 2024-03-10 ENCOUNTER — Telehealth: Admitting: Family

## 2024-03-10 DIAGNOSIS — L259 Unspecified contact dermatitis, unspecified cause: Secondary | ICD-10-CM

## 2024-03-10 MED ORDER — TRIAMCINOLONE ACETONIDE 0.5 % EX OINT
1.0000 | TOPICAL_OINTMENT | Freq: Two times a day (BID) | CUTANEOUS | 0 refills | Status: AC
Start: 2024-03-10 — End: ?

## 2024-03-10 NOTE — Progress Notes (Unsigned)
 E Visit for Rash  We are sorry that you are not feeling well. Here is how we plan to help!  Based on what you shared with me it looks like you have contact dermatitis.  Contact dermatitis is a skin rash caused by something that touches the skin and causes irritation or inflammation.  Your skin may be red, swollen, dry, cracked, and itch.  The rash should go away in a few days but can last a few weeks.  If you get a rash, it's important to figure out what caused it so the irritant can be avoided in the future. and I am prescribing triamcinolone  0.1 % cream -- apply to the affected area(s) in a thin layer, twice daily for up to 14 days. Do not apply to face, privates or armpit regions.   I recommend you take Benadryl  25 mg - 50 mg every 4 hours to control the symptoms but if they last over 24 hours it is best that you see an office based provider for follow up.    HOME CARE:  Take cool showers and avoid direct sunlight. Apply cool compress or wet dressings. Take a bath in an oatmeal bath.  Sprinkle content of one Aveeno packet under running faucet with comfortably warm water.  Bathe for 15-20 minutes, 1-2 times daily.  Pat dry with a towel. Do not rub the rash. Use hydrocortisone cream. Take an antihistamine like Benadryl  for widespread rashes that itch.  The adult dose of Benadryl  is 25-50 mg by mouth 4 times daily. Caution:  This type of medication may cause sleepiness.  Do not drink alcohol, drive, or operate dangerous machinery while taking antihistamines.  Do not take these medications if you have prostate enlargement.  Read package instructions thoroughly on all medications that you take.  GET HELP RIGHT AWAY IF:  Symptoms don't go away after treatment. Severe itching that persists. If you rash spreads or swells. If you rash begins to smell. If it blisters and opens or develops a yellow-brown crust. You develop a fever. You have a sore throat. You become short of breath.  MAKE SURE  YOU:  Understand these instructions. Will watch your condition. Will get help right away if you are not doing well or get worse.  Thank you for choosing an e-visit.  Your e-visit answers were reviewed by a board certified advanced clinical practitioner to complete your personal care plan. Depending upon the condition, your plan could have included both over the counter or prescription medications.  Please review your pharmacy choice. Make sure the pharmacy is open so you can pick up prescription now. If there is a problem, you may contact your provider through [employer] and have the prescription routed to another pharmacy.  Your safety is important to us . If you have drug allergies check your prescription carefully.   For the next 24 hours you can use MyChart to ask questions about today's visit, request a non-urgent call back, or ask for a work or school excuse. You will get an email in the next two days asking about your experience. I hope that your e-visit has been valuable and will speed your recovery.  Approximately 5 minutes was spent documenting and reviewing patient's chart.

## 2024-04-15 ENCOUNTER — Ambulatory Visit (INDEPENDENT_AMBULATORY_CARE_PROVIDER_SITE_OTHER): Admitting: Family Medicine

## 2024-04-15 ENCOUNTER — Encounter: Payer: Self-pay | Admitting: Family Medicine

## 2024-04-15 VITALS — BP 118/72 | HR 71 | Ht 66.0 in | Wt 151.6 lb

## 2024-04-15 DIAGNOSIS — M5412 Radiculopathy, cervical region: Secondary | ICD-10-CM | POA: Insufficient documentation

## 2024-04-15 DIAGNOSIS — R6882 Decreased libido: Secondary | ICD-10-CM | POA: Diagnosis not present

## 2024-04-15 MED ORDER — METHOCARBAMOL 500 MG PO TABS
500.0000 mg | ORAL_TABLET | Freq: Three times a day (TID) | ORAL | 0 refills | Status: DC | PRN
Start: 2024-04-15 — End: 2024-07-09

## 2024-04-15 MED ORDER — MELOXICAM 15 MG PO TABS
15.0000 mg | ORAL_TABLET | Freq: Every day | ORAL | 0 refills | Status: AC | PRN
Start: 2024-04-15 — End: 2024-05-15

## 2024-04-15 MED ORDER — GABAPENTIN 100 MG PO CAPS: 100.0000 mg | ORAL_CAPSULE | Freq: Three times a day (TID) | ORAL | 0 refills | Status: DC | PRN

## 2024-04-15 MED ORDER — PREDNISONE 20 MG PO TABS: 20.0000 mg | ORAL_TABLET | Freq: Two times a day (BID) | ORAL | 0 refills | Status: AC

## 2024-04-15 NOTE — Patient Instructions (Addendum)
 Patient Plan for Post-Visit Guidance  Left Cervical Radiculopathy - Take prednisone  20 mg twice daily for 5 days as prescribed. Complete the full course. - If symptoms persist after prednisone , take meloxicam  1 tablet daily as needed. - Take gabapentin  100 mg up to 3 times daily as needed for nerve pain. Use at night if it causes drowsiness. - Take methocarbamol  up to 3 times daily as needed for muscle relaxation. May cause drowsiness. - Begin neck and upper extremity exercises provided via MyChart midweek if symptoms improve. - Modify activities to avoid movements that aggravate your neck. - If symptoms do not improve in 2-4 weeks, contact the office to discuss possible imaging of the neck.  Decreased Libido - Consider starting bupropion (Wellbutrin) after finishing current medications for radiculopathy. - Contact the office if you wish to begin bupropion.  Red Flags - If you experience sudden weakness, loss of coordination, loss of bladder or bowel control, severe or worsening pain, or new symptoms, seek medical attention promptly.

## 2024-04-15 NOTE — Assessment & Plan Note (Signed)
 Decreased libido Previous tadalafil  trial ineffective. Discussed bupropion as a potential treatment. - Consider bupropion (Wellbutrin) after completing current medications for radiculopathy. - Contact our office if wanting to begin this.

## 2024-04-15 NOTE — Progress Notes (Signed)
 Primary Care / Sports Medicine Office Visit  Patient Information:  Patient ID: JOHNESHA Hughes, female DOB: 1990-10-02 Age: 33 y.o. MRN: [id number]   Tina Hughes is a pleasant 33 y.o. female presenting with the following:  Chief Complaint  Patient presents with   Neck Pain    X1 week. Pain in left neck radiating into left arm. Left thumb goes numb intermittently. Started with thumb and pointer finger were numb. Pt has not had XR of arm, neck, or hand before.     Vitals:   04/15/24 0804  BP: 118/72  Pulse: 71  SpO2: 100%   Vitals:   04/15/24 0804  Weight: 151 lb 9.6 oz (68.8 kg)  Height: 5' 6 (1.676 m)   Body mass index is 24.47 kg/m.  No results found.   Independent interpretation of notes and tests performed by another provider:   None  Procedures performed:   None  Pertinent History, Exam, Impression, and Recommendations:   Problem List Items Addressed This Visit     Decreased libido   Decreased libido Previous tadalafil  trial ineffective. Discussed bupropion as a potential treatment. - Consider bupropion (Wellbutrin) after completing current medications for radiculopathy. - Contact our office if wanting to begin this.      Radiculitis of left cervical region - Primary   History of Present Illness Tina Hughes is a 33 year old female who presents with numbness and aching in the left upper extremity.  Left upper extremity paresthesia and pain - Numbness and aching present in the left thumb and index finger for approximately two weeks, progressing to involve the entire hand, elbow, shoulder, and neck - Sensation described as 'achy' and persistent - Symptoms are particularly bothersome when lying on the stomach with a bent elbow - No prior episodes of similar symptoms lasting this long - No shooting pain into the fingers, but persistent numbness is present  Neck discomfort and provocative movements - Certain neck movements, especially  extension and rolling to the side, cause discomfort  Symptom modifiers and activity - No recent changes in activity levels - No specific incidents or injuries preceding symptom onset - No use of Tylenol , ice, or heat for symptom relief  Physical Exam NECK / CERVICAL SPINE INSPECTION: Normal cervical alignment without deformity PALPATION: Paraspinal musculature mild tenderness left upper trapezius, midline cervical spine nontender; no trigger points appreciated RANGE OF MOTION: Full flexion and extension with mild pain; mild limitation with leftward torsion due to discomfort; rightward torsion symmetric and painless STRENGTH: 5/5 in bilateral upper extremities  NEUROLOGICAL: Sensation diminished in the left upper extremity compared to the right; reflexes - brachioradialis 2+ right / 1+ left; motor strength intact, no focal weakness SPECIAL TESTS: Spurling's test negative bilaterally (elicits no radicular symptoms); Phalen's test negative; Tinel's negative at median nerve on left.  Assessment and Plan Left cervical radiculopathy Symptoms suggest nerve compression at the neck, differential includes left sided carpal tunnel syndrome. - Prescribed prednisone , 20 mg twice daily for 5 days. Explained potential side effects and advised completing the course. - If symptoms persist post-prednisone , dose meloxicam , 1 tablet daily as needed. - Prescribed gabapentin , 100 mg, up to 3 times daily as needed for nerve pain. Advised taking at night if needed due to potential drowsiness. - Prescribed methocarbamol  (Robaxin ), up to 3 times daily as needed for muscle relaxation.  Side effect can be drowsiness. - Provided exercises via MyChart to start midweek if symptoms improve. - Advised activity modification to  avoid neck aggravation. - Consider imaging of the neck if no improvement in 2-4 weeks.  Contact us  if this is the case.      Relevant Medications   predniSONE  (DELTASONE ) 20 MG tablet   gabapentin   (NEURONTIN ) 100 MG capsule   methocarbamol  (ROBAXIN ) 500 MG tablet   meloxicam  (MOBIC ) 15 MG tablet     Orders & Medications Medications:  Meds ordered this encounter  Medications   predniSONE  (DELTASONE ) 20 MG tablet    Sig: Take 1 tablet (20 mg total) by mouth 2 (two) times daily with a meal for 5 days.    Dispense:  10 tablet    Refill:  0   gabapentin  (NEURONTIN ) 100 MG capsule    Sig: Take 1 capsule (100 mg total) by mouth 3 (three) times daily as needed.    Dispense:  60 capsule    Refill:  0   methocarbamol  (ROBAXIN ) 500 MG tablet    Sig: Take 1 tablet (500 mg total) by mouth every 8 (eight) hours as needed for muscle spasms.    Dispense:  60 tablet    Refill:  0   meloxicam  (MOBIC ) 15 MG tablet    Sig: Take 1 tablet (15 mg total) by mouth daily as needed for pain.    Dispense:  30 tablet    Refill:  0   No orders of the defined types were placed in this encounter.    No follow-ups on file.     Tina JINNY Ku, MD, [facility]   Primary Care Sports Medicine Primary Care and Sports Medicine at [facility]

## 2024-04-15 NOTE — Assessment & Plan Note (Signed)
 History of Present Illness Tina Hughes is a 33 year old female who presents with numbness and aching in the left upper extremity.  Left upper extremity paresthesia and pain - Numbness and aching present in the left thumb and index finger for approximately two weeks, progressing to involve the entire hand, elbow, shoulder, and neck - Sensation described as 'achy' and persistent - Symptoms are particularly bothersome when lying on the stomach with a bent elbow - No prior episodes of similar symptoms lasting this long - No shooting pain into the fingers, but persistent numbness is present  Neck discomfort and provocative movements - Certain neck movements, especially extension and rolling to the side, cause discomfort  Symptom modifiers and activity - No recent changes in activity levels - No specific incidents or injuries preceding symptom onset - No use of Tylenol , ice, or heat for symptom relief  Physical Exam NECK / CERVICAL SPINE INSPECTION: Normal cervical alignment without deformity PALPATION: Paraspinal musculature mild tenderness left upper trapezius, midline cervical spine nontender; no trigger points appreciated RANGE OF MOTION: Full flexion and extension with mild pain; mild limitation with leftward torsion due to discomfort; rightward torsion symmetric and painless STRENGTH: 5/5 in bilateral upper extremities  NEUROLOGICAL: Sensation diminished in the left upper extremity compared to the right; reflexes - brachioradialis 2+ right / 1+ left; motor strength intact, no focal weakness SPECIAL TESTS: Spurling's test negative bilaterally (elicits no radicular symptoms); Phalen's test negative; Tinel's negative at median nerve on left.  Assessment and Plan Left cervical radiculopathy Symptoms suggest nerve compression at the neck, differential includes left sided carpal tunnel syndrome. - Prescribed prednisone , 20 mg twice daily for 5 days. Explained potential side effects and  advised completing the course. - If symptoms persist post-prednisone , dose meloxicam , 1 tablet daily as needed. - Prescribed gabapentin , 100 mg, up to 3 times daily as needed for nerve pain. Advised taking at night if needed due to potential drowsiness. - Prescribed methocarbamol  (Robaxin ), up to 3 times daily as needed for muscle relaxation.  Side effect can be drowsiness. - Provided exercises via MyChart to start midweek if symptoms improve. - Advised activity modification to avoid neck aggravation. - Consider imaging of the neck if no improvement in 2-4 weeks.  Contact us  if this is the case.

## 2024-05-14 ENCOUNTER — Other Ambulatory Visit: Payer: Self-pay | Admitting: Family Medicine

## 2024-05-14 DIAGNOSIS — I10 Essential (primary) hypertension: Secondary | ICD-10-CM

## 2024-05-16 NOTE — Telephone Encounter (Signed)
 Requested Prescriptions  Pending Prescriptions Disp Refills   lisinopril  (ZESTRIL ) 10 MG tablet [Pharmacy Med Name: Lisinopril  10 MG Oral Tablet] 90 tablet 0    Sig: Take 1 tablet by mouth once daily     Cardiovascular:  ACE Inhibitors Failed - 05/16/2024 11:13 AM      Failed - Cr in normal range and within 180 days    Creatinine, Ser  Date Value Ref Range Status  11/16/2023 1.17 (H) 0.57 - 1.00 mg/dL Final         Failed - K in normal range and within 180 days    Potassium  Date Value Ref Range Status  11/16/2023 4.7 3.5 - 5.2 mmol/L Final         Passed - Patient is not pregnant      Passed - Last BP in normal range    BP Readings from Last 1 Encounters:  04/15/24 118/72         Passed - Valid encounter within last 6 months    Recent Outpatient Visits           1 month ago Radiculitis of left cervical region   [facility] at [facility], Selinda PARAS, MD   5 months ago Iron deficiency anemia, unspecified iron deficiency anemia type    [facility] at [facility], Selinda PARAS, MD   6 months ago Episode of recurrent major depressive disorder, unspecified depression episode severity   [place] [facility] at [facility], Selinda PARAS, MD

## 2024-05-26 ENCOUNTER — Encounter: Payer: Self-pay | Admitting: Family Medicine

## 2024-05-27 ENCOUNTER — Other Ambulatory Visit: Payer: Self-pay

## 2024-05-27 DIAGNOSIS — I10 Essential (primary) hypertension: Secondary | ICD-10-CM

## 2024-05-27 DIAGNOSIS — R61 Generalized hyperhidrosis: Secondary | ICD-10-CM

## 2024-05-27 MED ORDER — GLYCOPYRROLATE 2 MG PO TABS: 2.0000 mg | ORAL_TABLET | Freq: Two times a day (BID) | ORAL | 1 refills | Status: AC

## 2024-05-27 MED ORDER — LISINOPRIL 10 MG PO TABS: 10.0000 mg | ORAL_TABLET | Freq: Every day | ORAL | 0 refills | Status: AC

## 2024-07-09 ENCOUNTER — Ambulatory Visit (INDEPENDENT_AMBULATORY_CARE_PROVIDER_SITE_OTHER): Admitting: Family Medicine

## 2024-07-09 ENCOUNTER — Encounter: Payer: Self-pay | Admitting: Family Medicine

## 2024-07-09 VITALS — BP 126/60 | HR 105 | Ht 66.0 in | Wt 158.0 lb

## 2024-07-09 DIAGNOSIS — Z111 Encounter for screening for respiratory tuberculosis: Secondary | ICD-10-CM | POA: Diagnosis not present

## 2024-07-09 DIAGNOSIS — Z0184 Encounter for antibody response examination: Secondary | ICD-10-CM | POA: Insufficient documentation

## 2024-07-09 DIAGNOSIS — I10 Essential (primary) hypertension: Secondary | ICD-10-CM | POA: Diagnosis not present

## 2024-07-09 NOTE — Progress Notes (Signed)
     Primary Care / Sports Medicine Office Visit  Patient Information:  Patient ID: Tina Hughes, female DOB: [birth year] Age: 33 y.o. MRN: [id number]   Tina Hughes is a pleasant 33 y.o. female presenting with the following:  Chief Complaint  Patient presents with   Immunizations    Patient presents to get lab order to make sure she has immunity to MMR, Hep B, She also needs a TB screening.     Vitals:   07/09/24 1522  BP: 126/60  Pulse: (!) 105  SpO2: 100%   Vitals:   07/09/24 1522  Weight: 158 lb (71.7 kg)  Height: 5' 6 (1.676 m)   Body mass index is 25.5 kg/m.  No results found.   Discussed the use of AI scribe software for clinical note transcription with the patient, who gave verbal consent to proceed.   Independent interpretation of notes and tests performed by another provider:   None  Procedures performed:   None  Pertinent History, Exam, Impression, and Recommendations:   Problem List Items Addressed This Visit     Encounter for antibody response examination   Immunization status evaluation - Presenting for evaluation of immunization status for measles, mumps, and rubella (MMR), and tuberculosis (TB) screening - Hepatitis B vaccine documented in the immunization registry  Plan Immunity status to measles, mumps, and rubella screening Screening for immunity required. Titers will determine immunity status. Vaccination recommended if immunity is insufficient. - Ordered MMR titers. - Recommend vaccination through health department if titers indicate insufficient immunity.      Relevant Orders   Measles/Mumps/Rubella Immunity   Hypertension - Primary   Plan Essential hypertension Managed with lisinopril . Current prescription sufficient. - Continue lisinopril .      Screening-pulmonary TB   Immunization status evaluation - Presenting for evaluation of immunization status for measles, mumps, and rubella (MMR), and tuberculosis (TB)  screening - Hepatitis B vaccine documented in the immunization registry  Plan Screening for tuberculosis Screening for latent tuberculosis infection required. TB Gold blood test will be used. - Ordered TB Gold blood test.      Relevant Orders   QuantiFERON-TB Gold Plus     Orders & Medications Medications: No orders of the defined types were placed in this encounter.  Orders Placed This Encounter  Procedures   Measles/Mumps/Rubella Immunity   QuantiFERON-TB Gold Plus     No follow-ups on file.     Selinda JINNY Ku, MD, [facility]   Primary Care Sports Medicine Primary Care and Sports Medicine at [facility]

## 2024-07-09 NOTE — Patient Instructions (Signed)
 VISIT SUMMARY:  Today, we evaluated your immunization status for measles, mumps, rubella (MMR), and tuberculosis (TB). We also reviewed your current medications for hypertension and hyperhidrosis.  YOUR PLAN:  IMMUNITY STATUS TO MEASLES, MUMPS, AND RUBELLA: We need to check if you are immune to measles, mumps, and rubella. -We ordered MMR titers to determine your immunity status. -If the titers show that you are not immune, we recommend getting vaccinated through the health department.  SCREENING FOR TUBERCULOSIS: We need to screen you for latent tuberculosis infection. -We ordered a TB Gold blood test to check for latent tuberculosis infection.  ESSENTIAL HYPERTENSION: Your high blood pressure is being managed with lisinopril . -Continue taking lisinopril  as prescribed.

## 2024-07-09 NOTE — Assessment & Plan Note (Signed)
 Immunization status evaluation - Presenting for evaluation of immunization status for measles, mumps, and rubella (MMR), and tuberculosis (TB) screening - Hepatitis B vaccine documented in the immunization registry  Plan  Screening for tuberculosis Screening for latent tuberculosis infection required. TB Gold blood test will be used. - Ordered TB Gold blood test.

## 2024-07-09 NOTE — Assessment & Plan Note (Signed)
 Plan  Essential hypertension Managed with lisinopril . Current prescription sufficient. - Continue lisinopril .

## 2024-07-09 NOTE — Assessment & Plan Note (Signed)
 Immunization status evaluation - Presenting for evaluation of immunization status for measles, mumps, and rubella (MMR), and tuberculosis (TB) screening - Hepatitis B vaccine documented in the immunization registry  Plan  Immunity status to measles, mumps, and rubella screening Screening for immunity required. Titers will determine immunity status. Vaccination recommended if immunity is insufficient. - Ordered MMR titers. - Recommend vaccination through health department if titers indicate insufficient immunity.

## 2024-07-11 ENCOUNTER — Ambulatory Visit: Payer: Self-pay | Admitting: Family Medicine

## 2024-07-11 LAB — QUANTIFERON-TB GOLD PLUS
QuantiFERON Mitogen Value: >10 [IU]/mL
QuantiFERON Nil Value: 0.02 [IU]/mL
QuantiFERON TB1 Ag Value: 0.02 [IU]/mL
QuantiFERON TB2 Ag Value: 0.02 [IU]/mL
QuantiFERON-TB Gold Plus: NEGATIVE

## 2024-07-11 LAB — MEASLES/MUMPS/RUBELLA IMMUNITY
MUMPS ABS, IGG: 244 [AU]/ml (ref >10.9)
RUBEOLA AB, IGG: >300 [AU]/ml (ref >16.4)
Rubella Antibodies, IGG: 2.73 {index} (ref >0.99)
